# Patient Record
Sex: Female | Born: 1950 | Race: White | Hispanic: No | Smoking: Former smoker
Health system: Southern US, Community
[De-identification: ages and names within clinical notes are randomized; demographics above are authoritative.]

## PROBLEM LIST (undated history)

## (undated) DIAGNOSIS — J449 Chronic obstructive pulmonary disease, unspecified: Secondary | ICD-10-CM

## (undated) DIAGNOSIS — Z7901 Long term (current) use of anticoagulants: Secondary | ICD-10-CM

## (undated) DIAGNOSIS — E079 Disorder of thyroid, unspecified: Secondary | ICD-10-CM

## (undated) DIAGNOSIS — F1120 Opioid dependence, uncomplicated: Secondary | ICD-10-CM

## (undated) DIAGNOSIS — E559 Vitamin D deficiency, unspecified: Secondary | ICD-10-CM

## (undated) DIAGNOSIS — G8190 Hemiplegia, unspecified affecting unspecified side: Secondary | ICD-10-CM

## (undated) DIAGNOSIS — I639 Cerebral infarction, unspecified: Secondary | ICD-10-CM

## (undated) DIAGNOSIS — G894 Chronic pain syndrome: Secondary | ICD-10-CM

## (undated) DIAGNOSIS — E785 Hyperlipidemia, unspecified: Secondary | ICD-10-CM

## (undated) DIAGNOSIS — F32A Depression, unspecified: Secondary | ICD-10-CM

## (undated) DIAGNOSIS — F329 Major depressive disorder, single episode, unspecified: Secondary | ICD-10-CM

## (undated) DIAGNOSIS — F800 Phonological disorder: Secondary | ICD-10-CM

## (undated) DIAGNOSIS — U071 COVID-19: Secondary | ICD-10-CM

## (undated) DIAGNOSIS — I2510 Atherosclerotic heart disease of native coronary artery without angina pectoris: Secondary | ICD-10-CM

## (undated) DIAGNOSIS — E039 Hypothyroidism, unspecified: Secondary | ICD-10-CM

## (undated) DIAGNOSIS — K219 Gastro-esophageal reflux disease without esophagitis: Secondary | ICD-10-CM

## (undated) DIAGNOSIS — D509 Iron deficiency anemia, unspecified: Secondary | ICD-10-CM

## (undated) DIAGNOSIS — F319 Bipolar disorder, unspecified: Secondary | ICD-10-CM

## (undated) DIAGNOSIS — Z951 Presence of aortocoronary bypass graft: Secondary | ICD-10-CM

## (undated) DIAGNOSIS — I509 Heart failure, unspecified: Secondary | ICD-10-CM

## (undated) DIAGNOSIS — F419 Anxiety disorder, unspecified: Secondary | ICD-10-CM

## (undated) DIAGNOSIS — G8220 Paraplegia, unspecified: Secondary | ICD-10-CM

## (undated) DIAGNOSIS — B009 Herpesviral infection, unspecified: Secondary | ICD-10-CM

## (undated) DIAGNOSIS — Z8679 Personal history of other diseases of the circulatory system: Secondary | ICD-10-CM

## (undated) DIAGNOSIS — E8809 Other disorders of plasma-protein metabolism, not elsewhere classified: Secondary | ICD-10-CM

## (undated) DIAGNOSIS — R569 Unspecified convulsions: Secondary | ICD-10-CM

## (undated) DIAGNOSIS — I10 Essential (primary) hypertension: Secondary | ICD-10-CM

## (undated) HISTORY — PX: THYROID SURGERY: SHX805

## (undated) HISTORY — PX: CARDIAC SURGERY: SHX584

---

## 2001-04-11 ENCOUNTER — Encounter: Payer: Self-pay | Admitting: Internal Medicine

## 2001-04-11 ENCOUNTER — Emergency Department (HOSPITAL_COMMUNITY): Admission: EM | Admit: 2001-04-11 | Discharge: 2001-04-11 | Payer: Self-pay | Admitting: Internal Medicine

## 2001-04-22 ENCOUNTER — Ambulatory Visit (HOSPITAL_COMMUNITY): Admission: RE | Admit: 2001-04-22 | Discharge: 2001-04-22 | Payer: Self-pay | Admitting: Internal Medicine

## 2001-04-22 ENCOUNTER — Encounter: Payer: Self-pay | Admitting: Internal Medicine

## 2001-08-25 ENCOUNTER — Encounter: Payer: Self-pay | Admitting: Internal Medicine

## 2001-08-25 ENCOUNTER — Ambulatory Visit (HOSPITAL_COMMUNITY): Admission: RE | Admit: 2001-08-25 | Discharge: 2001-08-25 | Payer: Self-pay | Admitting: Internal Medicine

## 2002-04-20 ENCOUNTER — Encounter: Payer: Self-pay | Admitting: *Deleted

## 2002-04-20 ENCOUNTER — Emergency Department (HOSPITAL_COMMUNITY): Admission: EM | Admit: 2002-04-20 | Discharge: 2002-04-20 | Payer: Self-pay | Admitting: *Deleted

## 2002-05-13 ENCOUNTER — Encounter: Payer: Self-pay | Admitting: Internal Medicine

## 2002-05-13 ENCOUNTER — Ambulatory Visit (HOSPITAL_COMMUNITY): Admission: RE | Admit: 2002-05-13 | Discharge: 2002-05-13 | Payer: Self-pay | Admitting: Internal Medicine

## 2003-02-26 ENCOUNTER — Encounter: Payer: Self-pay | Admitting: Emergency Medicine

## 2003-02-26 ENCOUNTER — Inpatient Hospital Stay (HOSPITAL_COMMUNITY): Admission: AD | Admit: 2003-02-26 | Discharge: 2003-03-22 | Payer: Self-pay | Admitting: Critical Care Medicine

## 2003-02-26 ENCOUNTER — Encounter: Payer: Self-pay | Admitting: Vascular Surgery

## 2003-02-27 ENCOUNTER — Encounter: Payer: Self-pay | Admitting: Neurology

## 2003-02-27 ENCOUNTER — Encounter: Payer: Self-pay | Admitting: Critical Care Medicine

## 2003-02-28 ENCOUNTER — Encounter: Payer: Self-pay | Admitting: Critical Care Medicine

## 2003-02-28 ENCOUNTER — Encounter: Payer: Self-pay | Admitting: Neurology

## 2003-03-01 ENCOUNTER — Encounter: Payer: Self-pay | Admitting: Cardiology

## 2003-03-03 ENCOUNTER — Encounter: Payer: Self-pay | Admitting: Neurology

## 2003-03-03 ENCOUNTER — Encounter: Payer: Self-pay | Admitting: Cardiology

## 2003-03-05 ENCOUNTER — Encounter: Payer: Self-pay | Admitting: Critical Care Medicine

## 2003-03-06 ENCOUNTER — Encounter: Payer: Self-pay | Admitting: Cardiology

## 2003-03-16 ENCOUNTER — Encounter: Payer: Self-pay | Admitting: Cardiology

## 2003-03-21 ENCOUNTER — Encounter: Payer: Self-pay | Admitting: Cardiology

## 2003-03-22 ENCOUNTER — Inpatient Hospital Stay (HOSPITAL_COMMUNITY)
Admission: RE | Admit: 2003-03-22 | Discharge: 2003-04-12 | Payer: Self-pay | Admitting: Physical Medicine & Rehabilitation

## 2003-04-01 ENCOUNTER — Encounter: Payer: Self-pay | Admitting: Physical Medicine & Rehabilitation

## 2003-04-11 ENCOUNTER — Encounter: Payer: Self-pay | Admitting: Physical Medicine & Rehabilitation

## 2003-04-12 ENCOUNTER — Encounter: Payer: Self-pay | Admitting: Thoracic Surgery (Cardiothoracic Vascular Surgery)

## 2003-04-12 ENCOUNTER — Inpatient Hospital Stay (HOSPITAL_COMMUNITY)
Admission: AD | Admit: 2003-04-12 | Discharge: 2003-04-20 | Payer: Self-pay | Admitting: Thoracic Surgery (Cardiothoracic Vascular Surgery)

## 2003-04-13 ENCOUNTER — Encounter: Payer: Self-pay | Admitting: Thoracic Surgery (Cardiothoracic Vascular Surgery)

## 2003-04-14 ENCOUNTER — Encounter: Payer: Self-pay | Admitting: Thoracic Surgery (Cardiothoracic Vascular Surgery)

## 2003-04-15 ENCOUNTER — Encounter: Payer: Self-pay | Admitting: Thoracic Surgery (Cardiothoracic Vascular Surgery)

## 2003-04-18 ENCOUNTER — Encounter: Payer: Self-pay | Admitting: Cardiothoracic Surgery

## 2003-04-19 ENCOUNTER — Encounter: Payer: Self-pay | Admitting: Pulmonary Disease

## 2003-04-20 ENCOUNTER — Inpatient Hospital Stay (HOSPITAL_COMMUNITY)
Admission: RE | Admit: 2003-04-20 | Discharge: 2003-06-07 | Payer: Self-pay | Admitting: Physical Medicine & Rehabilitation

## 2003-06-06 ENCOUNTER — Encounter: Payer: Self-pay | Admitting: Physical Medicine & Rehabilitation

## 2003-07-27 ENCOUNTER — Emergency Department (HOSPITAL_COMMUNITY): Admission: EM | Admit: 2003-07-27 | Discharge: 2003-07-27 | Payer: Self-pay | Admitting: Emergency Medicine

## 2003-07-27 ENCOUNTER — Encounter: Payer: Self-pay | Admitting: Emergency Medicine

## 2003-08-25 ENCOUNTER — Ambulatory Visit (HOSPITAL_COMMUNITY): Admission: RE | Admit: 2003-08-25 | Discharge: 2003-08-25 | Payer: Self-pay | Admitting: Internal Medicine

## 2003-09-10 ENCOUNTER — Emergency Department (HOSPITAL_COMMUNITY): Admission: EM | Admit: 2003-09-10 | Discharge: 2003-09-10 | Payer: Self-pay | Admitting: Emergency Medicine

## 2003-09-19 ENCOUNTER — Encounter: Admission: RE | Admit: 2003-09-19 | Discharge: 2003-09-19 | Payer: Self-pay | Admitting: Internal Medicine

## 2003-10-28 ENCOUNTER — Ambulatory Visit (HOSPITAL_COMMUNITY): Admission: RE | Admit: 2003-10-28 | Discharge: 2003-10-28 | Payer: Self-pay | Admitting: Internal Medicine

## 2003-11-09 ENCOUNTER — Inpatient Hospital Stay (HOSPITAL_COMMUNITY): Admission: EM | Admit: 2003-11-09 | Discharge: 2003-11-22 | Payer: Self-pay | Admitting: Emergency Medicine

## 2004-03-22 ENCOUNTER — Ambulatory Visit (HOSPITAL_COMMUNITY): Admission: RE | Admit: 2004-03-22 | Discharge: 2004-03-22 | Payer: Self-pay | Admitting: Internal Medicine

## 2004-12-09 ENCOUNTER — Inpatient Hospital Stay (HOSPITAL_COMMUNITY): Admission: EM | Admit: 2004-12-09 | Discharge: 2004-12-14 | Payer: Self-pay | Admitting: Emergency Medicine

## 2005-01-10 ENCOUNTER — Ambulatory Visit (HOSPITAL_COMMUNITY): Admission: RE | Admit: 2005-01-10 | Discharge: 2005-01-10 | Payer: Self-pay | Admitting: Internal Medicine

## 2005-02-20 ENCOUNTER — Emergency Department (HOSPITAL_COMMUNITY): Admission: EM | Admit: 2005-02-20 | Discharge: 2005-02-20 | Payer: Self-pay | Admitting: Emergency Medicine

## 2005-04-17 ENCOUNTER — Ambulatory Visit (HOSPITAL_COMMUNITY): Admission: RE | Admit: 2005-04-17 | Discharge: 2005-04-17 | Payer: Self-pay | Admitting: Internal Medicine

## 2005-09-18 ENCOUNTER — Emergency Department (HOSPITAL_COMMUNITY): Admission: EM | Admit: 2005-09-18 | Discharge: 2005-09-18 | Payer: Self-pay | Admitting: Emergency Medicine

## 2006-02-11 ENCOUNTER — Ambulatory Visit (HOSPITAL_COMMUNITY): Admission: RE | Admit: 2006-02-11 | Discharge: 2006-02-11 | Payer: Self-pay | Admitting: Internal Medicine

## 2006-03-06 ENCOUNTER — Ambulatory Visit (HOSPITAL_COMMUNITY): Admission: RE | Admit: 2006-03-06 | Discharge: 2006-03-06 | Payer: Self-pay | Admitting: Internal Medicine

## 2010-11-04 ENCOUNTER — Encounter: Payer: Self-pay | Admitting: Internal Medicine

## 2013-10-19 DIAGNOSIS — I4891 Unspecified atrial fibrillation: Secondary | ICD-10-CM | POA: Diagnosis not present

## 2013-10-19 DIAGNOSIS — Z7901 Long term (current) use of anticoagulants: Secondary | ICD-10-CM | POA: Diagnosis not present

## 2013-10-26 DIAGNOSIS — Z7901 Long term (current) use of anticoagulants: Secondary | ICD-10-CM | POA: Diagnosis not present

## 2013-11-01 DIAGNOSIS — R6889 Other general symptoms and signs: Secondary | ICD-10-CM | POA: Diagnosis not present

## 2013-11-09 DIAGNOSIS — Z8673 Personal history of transient ischemic attack (TIA), and cerebral infarction without residual deficits: Secondary | ICD-10-CM | POA: Diagnosis not present

## 2013-11-09 DIAGNOSIS — Z7901 Long term (current) use of anticoagulants: Secondary | ICD-10-CM | POA: Diagnosis not present

## 2013-11-09 DIAGNOSIS — D689 Coagulation defect, unspecified: Secondary | ICD-10-CM | POA: Diagnosis not present

## 2013-11-16 DIAGNOSIS — D689 Coagulation defect, unspecified: Secondary | ICD-10-CM | POA: Diagnosis not present

## 2013-11-16 DIAGNOSIS — I4891 Unspecified atrial fibrillation: Secondary | ICD-10-CM | POA: Diagnosis not present

## 2013-11-16 DIAGNOSIS — Z8673 Personal history of transient ischemic attack (TIA), and cerebral infarction without residual deficits: Secondary | ICD-10-CM | POA: Diagnosis not present

## 2013-11-17 DIAGNOSIS — B351 Tinea unguium: Secondary | ICD-10-CM | POA: Diagnosis not present

## 2013-11-17 DIAGNOSIS — M79609 Pain in unspecified limb: Secondary | ICD-10-CM | POA: Diagnosis not present

## 2013-11-23 DIAGNOSIS — Z8673 Personal history of transient ischemic attack (TIA), and cerebral infarction without residual deficits: Secondary | ICD-10-CM | POA: Diagnosis not present

## 2013-11-23 DIAGNOSIS — I4891 Unspecified atrial fibrillation: Secondary | ICD-10-CM | POA: Diagnosis not present

## 2013-11-23 DIAGNOSIS — D689 Coagulation defect, unspecified: Secondary | ICD-10-CM | POA: Diagnosis not present

## 2013-11-28 DIAGNOSIS — E039 Hypothyroidism, unspecified: Secondary | ICD-10-CM | POA: Diagnosis not present

## 2013-11-29 DIAGNOSIS — H2510 Age-related nuclear cataract, unspecified eye: Secondary | ICD-10-CM | POA: Diagnosis not present

## 2013-11-29 DIAGNOSIS — H04129 Dry eye syndrome of unspecified lacrimal gland: Secondary | ICD-10-CM | POA: Diagnosis not present

## 2013-11-29 DIAGNOSIS — IMO0002 Reserved for concepts with insufficient information to code with codable children: Secondary | ICD-10-CM | POA: Diagnosis not present

## 2013-11-29 DIAGNOSIS — H40019 Open angle with borderline findings, low risk, unspecified eye: Secondary | ICD-10-CM | POA: Diagnosis not present

## 2013-11-30 DIAGNOSIS — I4891 Unspecified atrial fibrillation: Secondary | ICD-10-CM | POA: Diagnosis not present

## 2013-12-01 DIAGNOSIS — F411 Generalized anxiety disorder: Secondary | ICD-10-CM | POA: Diagnosis not present

## 2013-12-01 DIAGNOSIS — I10 Essential (primary) hypertension: Secondary | ICD-10-CM | POA: Diagnosis not present

## 2013-12-03 DIAGNOSIS — D689 Coagulation defect, unspecified: Secondary | ICD-10-CM | POA: Diagnosis not present

## 2013-12-03 DIAGNOSIS — Z7901 Long term (current) use of anticoagulants: Secondary | ICD-10-CM | POA: Diagnosis not present

## 2013-12-03 DIAGNOSIS — Z8673 Personal history of transient ischemic attack (TIA), and cerebral infarction without residual deficits: Secondary | ICD-10-CM | POA: Diagnosis not present

## 2013-12-07 DIAGNOSIS — I4891 Unspecified atrial fibrillation: Secondary | ICD-10-CM | POA: Diagnosis not present

## 2013-12-10 DIAGNOSIS — R791 Abnormal coagulation profile: Secondary | ICD-10-CM | POA: Diagnosis not present

## 2013-12-10 DIAGNOSIS — I4891 Unspecified atrial fibrillation: Secondary | ICD-10-CM | POA: Diagnosis not present

## 2013-12-10 DIAGNOSIS — D689 Coagulation defect, unspecified: Secondary | ICD-10-CM | POA: Diagnosis not present

## 2013-12-10 DIAGNOSIS — Z7901 Long term (current) use of anticoagulants: Secondary | ICD-10-CM | POA: Diagnosis not present

## 2013-12-17 DIAGNOSIS — F411 Generalized anxiety disorder: Secondary | ICD-10-CM | POA: Diagnosis not present

## 2013-12-17 DIAGNOSIS — I4891 Unspecified atrial fibrillation: Secondary | ICD-10-CM | POA: Diagnosis not present

## 2013-12-17 DIAGNOSIS — M6281 Muscle weakness (generalized): Secondary | ICD-10-CM | POA: Diagnosis not present

## 2013-12-17 DIAGNOSIS — D689 Coagulation defect, unspecified: Secondary | ICD-10-CM | POA: Diagnosis not present

## 2013-12-21 DIAGNOSIS — D689 Coagulation defect, unspecified: Secondary | ICD-10-CM | POA: Diagnosis not present

## 2013-12-21 DIAGNOSIS — I4891 Unspecified atrial fibrillation: Secondary | ICD-10-CM | POA: Diagnosis not present

## 2013-12-28 DIAGNOSIS — D689 Coagulation defect, unspecified: Secondary | ICD-10-CM | POA: Diagnosis not present

## 2013-12-28 DIAGNOSIS — I4891 Unspecified atrial fibrillation: Secondary | ICD-10-CM | POA: Diagnosis not present

## 2014-01-11 DIAGNOSIS — I4891 Unspecified atrial fibrillation: Secondary | ICD-10-CM | POA: Diagnosis not present

## 2014-01-11 DIAGNOSIS — D689 Coagulation defect, unspecified: Secondary | ICD-10-CM | POA: Diagnosis not present

## 2014-01-18 DIAGNOSIS — D689 Coagulation defect, unspecified: Secondary | ICD-10-CM | POA: Diagnosis not present

## 2014-01-18 DIAGNOSIS — I4891 Unspecified atrial fibrillation: Secondary | ICD-10-CM | POA: Diagnosis not present

## 2014-01-21 DIAGNOSIS — D689 Coagulation defect, unspecified: Secondary | ICD-10-CM | POA: Diagnosis not present

## 2014-01-21 DIAGNOSIS — K5900 Constipation, unspecified: Secondary | ICD-10-CM | POA: Diagnosis not present

## 2014-01-21 DIAGNOSIS — K120 Recurrent oral aphthae: Secondary | ICD-10-CM | POA: Diagnosis not present

## 2014-01-21 DIAGNOSIS — R300 Dysuria: Secondary | ICD-10-CM | POA: Diagnosis not present

## 2014-01-21 DIAGNOSIS — I4891 Unspecified atrial fibrillation: Secondary | ICD-10-CM | POA: Diagnosis not present

## 2014-01-22 DIAGNOSIS — R300 Dysuria: Secondary | ICD-10-CM | POA: Diagnosis not present

## 2014-01-24 DIAGNOSIS — N390 Urinary tract infection, site not specified: Secondary | ICD-10-CM | POA: Diagnosis not present

## 2014-01-24 DIAGNOSIS — D689 Coagulation defect, unspecified: Secondary | ICD-10-CM | POA: Diagnosis not present

## 2014-01-25 DIAGNOSIS — I509 Heart failure, unspecified: Secondary | ICD-10-CM | POA: Diagnosis not present

## 2014-01-25 DIAGNOSIS — N390 Urinary tract infection, site not specified: Secondary | ICD-10-CM | POA: Diagnosis not present

## 2014-01-25 DIAGNOSIS — D689 Coagulation defect, unspecified: Secondary | ICD-10-CM | POA: Diagnosis not present

## 2014-01-25 DIAGNOSIS — Z79899 Other long term (current) drug therapy: Secondary | ICD-10-CM | POA: Diagnosis not present

## 2014-01-25 DIAGNOSIS — I10 Essential (primary) hypertension: Secondary | ICD-10-CM | POA: Diagnosis not present

## 2014-01-25 DIAGNOSIS — I4891 Unspecified atrial fibrillation: Secondary | ICD-10-CM | POA: Diagnosis not present

## 2014-01-25 DIAGNOSIS — E46 Unspecified protein-calorie malnutrition: Secondary | ICD-10-CM | POA: Diagnosis not present

## 2014-01-26 DIAGNOSIS — K120 Recurrent oral aphthae: Secondary | ICD-10-CM | POA: Diagnosis not present

## 2014-01-26 DIAGNOSIS — D689 Coagulation defect, unspecified: Secondary | ICD-10-CM | POA: Diagnosis not present

## 2014-01-26 DIAGNOSIS — N390 Urinary tract infection, site not specified: Secondary | ICD-10-CM | POA: Diagnosis not present

## 2014-01-28 DIAGNOSIS — D689 Coagulation defect, unspecified: Secondary | ICD-10-CM | POA: Diagnosis not present

## 2014-01-28 DIAGNOSIS — K120 Recurrent oral aphthae: Secondary | ICD-10-CM | POA: Diagnosis not present

## 2014-01-28 DIAGNOSIS — Z79899 Other long term (current) drug therapy: Secondary | ICD-10-CM | POA: Diagnosis not present

## 2014-01-28 DIAGNOSIS — N390 Urinary tract infection, site not specified: Secondary | ICD-10-CM | POA: Diagnosis not present

## 2014-02-01 DIAGNOSIS — D689 Coagulation defect, unspecified: Secondary | ICD-10-CM | POA: Diagnosis not present

## 2014-02-01 DIAGNOSIS — Z79899 Other long term (current) drug therapy: Secondary | ICD-10-CM | POA: Diagnosis not present

## 2014-02-01 DIAGNOSIS — N390 Urinary tract infection, site not specified: Secondary | ICD-10-CM | POA: Diagnosis not present

## 2014-02-01 DIAGNOSIS — Z7901 Long term (current) use of anticoagulants: Secondary | ICD-10-CM | POA: Diagnosis not present

## 2014-02-02 DIAGNOSIS — B351 Tinea unguium: Secondary | ICD-10-CM | POA: Diagnosis not present

## 2014-02-02 DIAGNOSIS — M79609 Pain in unspecified limb: Secondary | ICD-10-CM | POA: Diagnosis not present

## 2014-02-08 DIAGNOSIS — Z7901 Long term (current) use of anticoagulants: Secondary | ICD-10-CM | POA: Diagnosis not present

## 2014-02-08 DIAGNOSIS — D689 Coagulation defect, unspecified: Secondary | ICD-10-CM | POA: Diagnosis not present

## 2014-02-08 DIAGNOSIS — N390 Urinary tract infection, site not specified: Secondary | ICD-10-CM | POA: Diagnosis not present

## 2014-02-08 DIAGNOSIS — Z79899 Other long term (current) drug therapy: Secondary | ICD-10-CM | POA: Diagnosis not present

## 2014-02-15 DIAGNOSIS — I4891 Unspecified atrial fibrillation: Secondary | ICD-10-CM | POA: Diagnosis not present

## 2014-02-15 DIAGNOSIS — D689 Coagulation defect, unspecified: Secondary | ICD-10-CM | POA: Diagnosis not present

## 2014-02-15 DIAGNOSIS — Z79899 Other long term (current) drug therapy: Secondary | ICD-10-CM | POA: Diagnosis not present

## 2014-02-22 DIAGNOSIS — Z79899 Other long term (current) drug therapy: Secondary | ICD-10-CM | POA: Diagnosis not present

## 2014-02-23 DIAGNOSIS — D689 Coagulation defect, unspecified: Secondary | ICD-10-CM | POA: Diagnosis not present

## 2014-03-01 DIAGNOSIS — I4891 Unspecified atrial fibrillation: Secondary | ICD-10-CM | POA: Diagnosis not present

## 2014-03-01 DIAGNOSIS — D689 Coagulation defect, unspecified: Secondary | ICD-10-CM | POA: Diagnosis not present

## 2014-03-01 DIAGNOSIS — G609 Hereditary and idiopathic neuropathy, unspecified: Secondary | ICD-10-CM | POA: Diagnosis not present

## 2014-03-08 DIAGNOSIS — D689 Coagulation defect, unspecified: Secondary | ICD-10-CM | POA: Diagnosis not present

## 2014-03-08 DIAGNOSIS — I4891 Unspecified atrial fibrillation: Secondary | ICD-10-CM | POA: Diagnosis not present

## 2014-03-11 DIAGNOSIS — D689 Coagulation defect, unspecified: Secondary | ICD-10-CM | POA: Diagnosis not present

## 2014-03-11 DIAGNOSIS — I4891 Unspecified atrial fibrillation: Secondary | ICD-10-CM | POA: Diagnosis not present

## 2014-03-15 DIAGNOSIS — I4891 Unspecified atrial fibrillation: Secondary | ICD-10-CM | POA: Diagnosis not present

## 2014-03-21 DIAGNOSIS — R10819 Abdominal tenderness, unspecified site: Secondary | ICD-10-CM | POA: Diagnosis not present

## 2014-03-21 DIAGNOSIS — D689 Coagulation defect, unspecified: Secondary | ICD-10-CM | POA: Diagnosis not present

## 2014-03-21 DIAGNOSIS — R300 Dysuria: Secondary | ICD-10-CM | POA: Diagnosis not present

## 2014-03-22 DIAGNOSIS — R300 Dysuria: Secondary | ICD-10-CM | POA: Diagnosis not present

## 2014-03-22 DIAGNOSIS — R10819 Abdominal tenderness, unspecified site: Secondary | ICD-10-CM | POA: Diagnosis not present

## 2014-03-22 DIAGNOSIS — I4891 Unspecified atrial fibrillation: Secondary | ICD-10-CM | POA: Diagnosis not present

## 2014-03-22 DIAGNOSIS — D689 Coagulation defect, unspecified: Secondary | ICD-10-CM | POA: Diagnosis not present

## 2014-03-23 DIAGNOSIS — R300 Dysuria: Secondary | ICD-10-CM | POA: Diagnosis not present

## 2014-03-24 DIAGNOSIS — I4891 Unspecified atrial fibrillation: Secondary | ICD-10-CM | POA: Diagnosis not present

## 2014-03-25 DIAGNOSIS — R10819 Abdominal tenderness, unspecified site: Secondary | ICD-10-CM | POA: Diagnosis not present

## 2014-03-25 DIAGNOSIS — D689 Coagulation defect, unspecified: Secondary | ICD-10-CM | POA: Diagnosis not present

## 2014-03-31 DIAGNOSIS — I4891 Unspecified atrial fibrillation: Secondary | ICD-10-CM | POA: Diagnosis not present

## 2014-03-31 DIAGNOSIS — D689 Coagulation defect, unspecified: Secondary | ICD-10-CM | POA: Diagnosis not present

## 2014-04-01 DIAGNOSIS — I517 Cardiomegaly: Secondary | ICD-10-CM | POA: Diagnosis not present

## 2014-04-07 DIAGNOSIS — I4891 Unspecified atrial fibrillation: Secondary | ICD-10-CM | POA: Diagnosis not present

## 2014-04-09 DIAGNOSIS — R609 Edema, unspecified: Secondary | ICD-10-CM | POA: Diagnosis not present

## 2014-04-12 DIAGNOSIS — I509 Heart failure, unspecified: Secondary | ICD-10-CM | POA: Diagnosis not present

## 2014-04-12 DIAGNOSIS — M6281 Muscle weakness (generalized): Secondary | ICD-10-CM | POA: Diagnosis not present

## 2014-04-13 DIAGNOSIS — M6281 Muscle weakness (generalized): Secondary | ICD-10-CM | POA: Diagnosis not present

## 2014-04-13 DIAGNOSIS — F329 Major depressive disorder, single episode, unspecified: Secondary | ICD-10-CM | POA: Diagnosis not present

## 2014-04-13 DIAGNOSIS — I509 Heart failure, unspecified: Secondary | ICD-10-CM | POA: Diagnosis not present

## 2014-04-13 DIAGNOSIS — F3289 Other specified depressive episodes: Secondary | ICD-10-CM | POA: Diagnosis not present

## 2014-04-14 DIAGNOSIS — Z8673 Personal history of transient ischemic attack (TIA), and cerebral infarction without residual deficits: Secondary | ICD-10-CM | POA: Diagnosis not present

## 2014-04-14 DIAGNOSIS — R52 Pain, unspecified: Secondary | ICD-10-CM | POA: Diagnosis not present

## 2014-04-14 DIAGNOSIS — D689 Coagulation defect, unspecified: Secondary | ICD-10-CM | POA: Diagnosis not present

## 2014-04-14 DIAGNOSIS — Z7901 Long term (current) use of anticoagulants: Secondary | ICD-10-CM | POA: Diagnosis not present

## 2014-04-14 DIAGNOSIS — R10819 Abdominal tenderness, unspecified site: Secondary | ICD-10-CM | POA: Diagnosis not present

## 2014-04-20 DIAGNOSIS — M79609 Pain in unspecified limb: Secondary | ICD-10-CM | POA: Diagnosis not present

## 2014-04-20 DIAGNOSIS — B351 Tinea unguium: Secondary | ICD-10-CM | POA: Diagnosis not present

## 2014-04-21 DIAGNOSIS — Z7901 Long term (current) use of anticoagulants: Secondary | ICD-10-CM | POA: Diagnosis not present

## 2014-04-21 DIAGNOSIS — D689 Coagulation defect, unspecified: Secondary | ICD-10-CM | POA: Diagnosis not present

## 2014-04-21 DIAGNOSIS — Z8673 Personal history of transient ischemic attack (TIA), and cerebral infarction without residual deficits: Secondary | ICD-10-CM | POA: Diagnosis not present

## 2014-04-28 DIAGNOSIS — Z7901 Long term (current) use of anticoagulants: Secondary | ICD-10-CM | POA: Diagnosis not present

## 2014-04-28 DIAGNOSIS — R109 Unspecified abdominal pain: Secondary | ICD-10-CM | POA: Diagnosis not present

## 2014-05-10 DIAGNOSIS — Z8673 Personal history of transient ischemic attack (TIA), and cerebral infarction without residual deficits: Secondary | ICD-10-CM | POA: Diagnosis not present

## 2014-05-10 DIAGNOSIS — B369 Superficial mycosis, unspecified: Secondary | ICD-10-CM | POA: Diagnosis not present

## 2014-05-10 DIAGNOSIS — D689 Coagulation defect, unspecified: Secondary | ICD-10-CM | POA: Diagnosis not present

## 2014-05-12 DIAGNOSIS — Z8673 Personal history of transient ischemic attack (TIA), and cerebral infarction without residual deficits: Secondary | ICD-10-CM | POA: Diagnosis not present

## 2014-05-12 DIAGNOSIS — B369 Superficial mycosis, unspecified: Secondary | ICD-10-CM | POA: Diagnosis not present

## 2014-05-12 DIAGNOSIS — I4891 Unspecified atrial fibrillation: Secondary | ICD-10-CM | POA: Diagnosis not present

## 2014-05-12 DIAGNOSIS — D689 Coagulation defect, unspecified: Secondary | ICD-10-CM | POA: Diagnosis not present

## 2014-05-16 DIAGNOSIS — I509 Heart failure, unspecified: Secondary | ICD-10-CM | POA: Diagnosis not present

## 2014-05-16 DIAGNOSIS — F329 Major depressive disorder, single episode, unspecified: Secondary | ICD-10-CM | POA: Diagnosis not present

## 2014-05-16 DIAGNOSIS — F3289 Other specified depressive episodes: Secondary | ICD-10-CM | POA: Diagnosis not present

## 2014-05-16 DIAGNOSIS — M6281 Muscle weakness (generalized): Secondary | ICD-10-CM | POA: Diagnosis not present

## 2014-05-17 DIAGNOSIS — F3289 Other specified depressive episodes: Secondary | ICD-10-CM | POA: Diagnosis not present

## 2014-05-17 DIAGNOSIS — I509 Heart failure, unspecified: Secondary | ICD-10-CM | POA: Diagnosis not present

## 2014-05-17 DIAGNOSIS — M6281 Muscle weakness (generalized): Secondary | ICD-10-CM | POA: Diagnosis not present

## 2014-05-17 DIAGNOSIS — F329 Major depressive disorder, single episode, unspecified: Secondary | ICD-10-CM | POA: Diagnosis not present

## 2014-05-18 DIAGNOSIS — M6281 Muscle weakness (generalized): Secondary | ICD-10-CM | POA: Diagnosis not present

## 2014-05-18 DIAGNOSIS — F329 Major depressive disorder, single episode, unspecified: Secondary | ICD-10-CM | POA: Diagnosis not present

## 2014-05-18 DIAGNOSIS — I509 Heart failure, unspecified: Secondary | ICD-10-CM | POA: Diagnosis not present

## 2014-05-18 DIAGNOSIS — F3289 Other specified depressive episodes: Secondary | ICD-10-CM | POA: Diagnosis not present

## 2014-05-19 DIAGNOSIS — F3289 Other specified depressive episodes: Secondary | ICD-10-CM | POA: Diagnosis not present

## 2014-05-19 DIAGNOSIS — Z7901 Long term (current) use of anticoagulants: Secondary | ICD-10-CM | POA: Diagnosis not present

## 2014-05-19 DIAGNOSIS — M6281 Muscle weakness (generalized): Secondary | ICD-10-CM | POA: Diagnosis not present

## 2014-05-19 DIAGNOSIS — F329 Major depressive disorder, single episode, unspecified: Secondary | ICD-10-CM | POA: Diagnosis not present

## 2014-05-19 DIAGNOSIS — I509 Heart failure, unspecified: Secondary | ICD-10-CM | POA: Diagnosis not present

## 2014-05-21 DIAGNOSIS — F3289 Other specified depressive episodes: Secondary | ICD-10-CM | POA: Diagnosis not present

## 2014-05-21 DIAGNOSIS — M6281 Muscle weakness (generalized): Secondary | ICD-10-CM | POA: Diagnosis not present

## 2014-05-21 DIAGNOSIS — I509 Heart failure, unspecified: Secondary | ICD-10-CM | POA: Diagnosis not present

## 2014-05-21 DIAGNOSIS — F329 Major depressive disorder, single episode, unspecified: Secondary | ICD-10-CM | POA: Diagnosis not present

## 2014-05-23 DIAGNOSIS — F329 Major depressive disorder, single episode, unspecified: Secondary | ICD-10-CM | POA: Diagnosis not present

## 2014-05-23 DIAGNOSIS — F3289 Other specified depressive episodes: Secondary | ICD-10-CM | POA: Diagnosis not present

## 2014-05-23 DIAGNOSIS — I509 Heart failure, unspecified: Secondary | ICD-10-CM | POA: Diagnosis not present

## 2014-05-23 DIAGNOSIS — M6281 Muscle weakness (generalized): Secondary | ICD-10-CM | POA: Diagnosis not present

## 2014-05-24 DIAGNOSIS — I509 Heart failure, unspecified: Secondary | ICD-10-CM | POA: Diagnosis not present

## 2014-05-24 DIAGNOSIS — F329 Major depressive disorder, single episode, unspecified: Secondary | ICD-10-CM | POA: Diagnosis not present

## 2014-05-24 DIAGNOSIS — M6281 Muscle weakness (generalized): Secondary | ICD-10-CM | POA: Diagnosis not present

## 2014-05-24 DIAGNOSIS — F3289 Other specified depressive episodes: Secondary | ICD-10-CM | POA: Diagnosis not present

## 2014-05-25 DIAGNOSIS — I509 Heart failure, unspecified: Secondary | ICD-10-CM | POA: Diagnosis not present

## 2014-05-25 DIAGNOSIS — F3289 Other specified depressive episodes: Secondary | ICD-10-CM | POA: Diagnosis not present

## 2014-05-25 DIAGNOSIS — F329 Major depressive disorder, single episode, unspecified: Secondary | ICD-10-CM | POA: Diagnosis not present

## 2014-05-25 DIAGNOSIS — M6281 Muscle weakness (generalized): Secondary | ICD-10-CM | POA: Diagnosis not present

## 2014-05-26 DIAGNOSIS — I509 Heart failure, unspecified: Secondary | ICD-10-CM | POA: Diagnosis not present

## 2014-05-26 DIAGNOSIS — F329 Major depressive disorder, single episode, unspecified: Secondary | ICD-10-CM | POA: Diagnosis not present

## 2014-05-26 DIAGNOSIS — M6281 Muscle weakness (generalized): Secondary | ICD-10-CM | POA: Diagnosis not present

## 2014-05-26 DIAGNOSIS — F3289 Other specified depressive episodes: Secondary | ICD-10-CM | POA: Diagnosis not present

## 2014-05-27 DIAGNOSIS — F329 Major depressive disorder, single episode, unspecified: Secondary | ICD-10-CM | POA: Diagnosis not present

## 2014-05-27 DIAGNOSIS — I509 Heart failure, unspecified: Secondary | ICD-10-CM | POA: Diagnosis not present

## 2014-05-27 DIAGNOSIS — F3289 Other specified depressive episodes: Secondary | ICD-10-CM | POA: Diagnosis not present

## 2014-05-27 DIAGNOSIS — M6281 Muscle weakness (generalized): Secondary | ICD-10-CM | POA: Diagnosis not present

## 2014-05-30 DIAGNOSIS — M6281 Muscle weakness (generalized): Secondary | ICD-10-CM | POA: Diagnosis not present

## 2014-05-30 DIAGNOSIS — I509 Heart failure, unspecified: Secondary | ICD-10-CM | POA: Diagnosis not present

## 2014-05-30 DIAGNOSIS — F3289 Other specified depressive episodes: Secondary | ICD-10-CM | POA: Diagnosis not present

## 2014-05-30 DIAGNOSIS — F329 Major depressive disorder, single episode, unspecified: Secondary | ICD-10-CM | POA: Diagnosis not present

## 2014-05-31 DIAGNOSIS — F329 Major depressive disorder, single episode, unspecified: Secondary | ICD-10-CM | POA: Diagnosis not present

## 2014-05-31 DIAGNOSIS — M6281 Muscle weakness (generalized): Secondary | ICD-10-CM | POA: Diagnosis not present

## 2014-05-31 DIAGNOSIS — F3289 Other specified depressive episodes: Secondary | ICD-10-CM | POA: Diagnosis not present

## 2014-05-31 DIAGNOSIS — I509 Heart failure, unspecified: Secondary | ICD-10-CM | POA: Diagnosis not present

## 2014-06-01 DIAGNOSIS — F329 Major depressive disorder, single episode, unspecified: Secondary | ICD-10-CM | POA: Diagnosis not present

## 2014-06-01 DIAGNOSIS — F3289 Other specified depressive episodes: Secondary | ICD-10-CM | POA: Diagnosis not present

## 2014-06-01 DIAGNOSIS — I509 Heart failure, unspecified: Secondary | ICD-10-CM | POA: Diagnosis not present

## 2014-06-01 DIAGNOSIS — M6281 Muscle weakness (generalized): Secondary | ICD-10-CM | POA: Diagnosis not present

## 2014-06-02 DIAGNOSIS — I509 Heart failure, unspecified: Secondary | ICD-10-CM | POA: Diagnosis not present

## 2014-06-02 DIAGNOSIS — F329 Major depressive disorder, single episode, unspecified: Secondary | ICD-10-CM | POA: Diagnosis not present

## 2014-06-02 DIAGNOSIS — M6281 Muscle weakness (generalized): Secondary | ICD-10-CM | POA: Diagnosis not present

## 2014-06-02 DIAGNOSIS — F3289 Other specified depressive episodes: Secondary | ICD-10-CM | POA: Diagnosis not present

## 2014-06-03 DIAGNOSIS — Z8673 Personal history of transient ischemic attack (TIA), and cerebral infarction without residual deficits: Secondary | ICD-10-CM | POA: Diagnosis not present

## 2014-06-03 DIAGNOSIS — D689 Coagulation defect, unspecified: Secondary | ICD-10-CM | POA: Diagnosis not present

## 2014-06-03 DIAGNOSIS — Z7901 Long term (current) use of anticoagulants: Secondary | ICD-10-CM | POA: Diagnosis not present

## 2014-06-03 DIAGNOSIS — F3289 Other specified depressive episodes: Secondary | ICD-10-CM | POA: Diagnosis not present

## 2014-06-03 DIAGNOSIS — F329 Major depressive disorder, single episode, unspecified: Secondary | ICD-10-CM | POA: Diagnosis not present

## 2014-06-03 DIAGNOSIS — M6281 Muscle weakness (generalized): Secondary | ICD-10-CM | POA: Diagnosis not present

## 2014-06-03 DIAGNOSIS — I509 Heart failure, unspecified: Secondary | ICD-10-CM | POA: Diagnosis not present

## 2014-06-06 DIAGNOSIS — I509 Heart failure, unspecified: Secondary | ICD-10-CM | POA: Diagnosis not present

## 2014-06-06 DIAGNOSIS — F3289 Other specified depressive episodes: Secondary | ICD-10-CM | POA: Diagnosis not present

## 2014-06-06 DIAGNOSIS — M6281 Muscle weakness (generalized): Secondary | ICD-10-CM | POA: Diagnosis not present

## 2014-06-06 DIAGNOSIS — F329 Major depressive disorder, single episode, unspecified: Secondary | ICD-10-CM | POA: Diagnosis not present

## 2014-06-07 DIAGNOSIS — F3289 Other specified depressive episodes: Secondary | ICD-10-CM | POA: Diagnosis not present

## 2014-06-07 DIAGNOSIS — I509 Heart failure, unspecified: Secondary | ICD-10-CM | POA: Diagnosis not present

## 2014-06-07 DIAGNOSIS — M6281 Muscle weakness (generalized): Secondary | ICD-10-CM | POA: Diagnosis not present

## 2014-06-07 DIAGNOSIS — F329 Major depressive disorder, single episode, unspecified: Secondary | ICD-10-CM | POA: Diagnosis not present

## 2014-06-08 DIAGNOSIS — M6281 Muscle weakness (generalized): Secondary | ICD-10-CM | POA: Diagnosis not present

## 2014-06-08 DIAGNOSIS — F3289 Other specified depressive episodes: Secondary | ICD-10-CM | POA: Diagnosis not present

## 2014-06-08 DIAGNOSIS — F329 Major depressive disorder, single episode, unspecified: Secondary | ICD-10-CM | POA: Diagnosis not present

## 2014-06-08 DIAGNOSIS — I509 Heart failure, unspecified: Secondary | ICD-10-CM | POA: Diagnosis not present

## 2014-06-09 DIAGNOSIS — I509 Heart failure, unspecified: Secondary | ICD-10-CM | POA: Diagnosis not present

## 2014-06-09 DIAGNOSIS — F329 Major depressive disorder, single episode, unspecified: Secondary | ICD-10-CM | POA: Diagnosis not present

## 2014-06-09 DIAGNOSIS — F3289 Other specified depressive episodes: Secondary | ICD-10-CM | POA: Diagnosis not present

## 2014-06-09 DIAGNOSIS — M6281 Muscle weakness (generalized): Secondary | ICD-10-CM | POA: Diagnosis not present

## 2014-06-10 DIAGNOSIS — I509 Heart failure, unspecified: Secondary | ICD-10-CM | POA: Diagnosis not present

## 2014-06-10 DIAGNOSIS — F329 Major depressive disorder, single episode, unspecified: Secondary | ICD-10-CM | POA: Diagnosis not present

## 2014-06-10 DIAGNOSIS — F3289 Other specified depressive episodes: Secondary | ICD-10-CM | POA: Diagnosis not present

## 2014-06-10 DIAGNOSIS — M6281 Muscle weakness (generalized): Secondary | ICD-10-CM | POA: Diagnosis not present

## 2014-06-13 DIAGNOSIS — F329 Major depressive disorder, single episode, unspecified: Secondary | ICD-10-CM | POA: Diagnosis not present

## 2014-06-13 DIAGNOSIS — I509 Heart failure, unspecified: Secondary | ICD-10-CM | POA: Diagnosis not present

## 2014-06-13 DIAGNOSIS — M6281 Muscle weakness (generalized): Secondary | ICD-10-CM | POA: Diagnosis not present

## 2014-06-13 DIAGNOSIS — F3289 Other specified depressive episodes: Secondary | ICD-10-CM | POA: Diagnosis not present

## 2014-06-14 DIAGNOSIS — F329 Major depressive disorder, single episode, unspecified: Secondary | ICD-10-CM | POA: Diagnosis not present

## 2014-06-14 DIAGNOSIS — M6281 Muscle weakness (generalized): Secondary | ICD-10-CM | POA: Diagnosis not present

## 2014-06-14 DIAGNOSIS — F3289 Other specified depressive episodes: Secondary | ICD-10-CM | POA: Diagnosis not present

## 2014-06-14 DIAGNOSIS — I509 Heart failure, unspecified: Secondary | ICD-10-CM | POA: Diagnosis not present

## 2014-06-15 DIAGNOSIS — Z8673 Personal history of transient ischemic attack (TIA), and cerebral infarction without residual deficits: Secondary | ICD-10-CM | POA: Diagnosis not present

## 2014-06-15 DIAGNOSIS — R1032 Left lower quadrant pain: Secondary | ICD-10-CM | POA: Diagnosis not present

## 2014-06-15 DIAGNOSIS — I509 Heart failure, unspecified: Secondary | ICD-10-CM | POA: Diagnosis not present

## 2014-06-15 DIAGNOSIS — F329 Major depressive disorder, single episode, unspecified: Secondary | ICD-10-CM | POA: Diagnosis not present

## 2014-06-15 DIAGNOSIS — F3289 Other specified depressive episodes: Secondary | ICD-10-CM | POA: Diagnosis not present

## 2014-06-15 DIAGNOSIS — M6281 Muscle weakness (generalized): Secondary | ICD-10-CM | POA: Diagnosis not present

## 2014-06-16 DIAGNOSIS — M6281 Muscle weakness (generalized): Secondary | ICD-10-CM | POA: Diagnosis not present

## 2014-06-16 DIAGNOSIS — I509 Heart failure, unspecified: Secondary | ICD-10-CM | POA: Diagnosis not present

## 2014-06-16 DIAGNOSIS — F3289 Other specified depressive episodes: Secondary | ICD-10-CM | POA: Diagnosis not present

## 2014-06-16 DIAGNOSIS — F329 Major depressive disorder, single episode, unspecified: Secondary | ICD-10-CM | POA: Diagnosis not present

## 2014-06-17 DIAGNOSIS — F329 Major depressive disorder, single episode, unspecified: Secondary | ICD-10-CM | POA: Diagnosis not present

## 2014-06-17 DIAGNOSIS — M6281 Muscle weakness (generalized): Secondary | ICD-10-CM | POA: Diagnosis not present

## 2014-06-17 DIAGNOSIS — I4891 Unspecified atrial fibrillation: Secondary | ICD-10-CM | POA: Diagnosis not present

## 2014-06-17 DIAGNOSIS — Z7901 Long term (current) use of anticoagulants: Secondary | ICD-10-CM | POA: Diagnosis not present

## 2014-06-17 DIAGNOSIS — I509 Heart failure, unspecified: Secondary | ICD-10-CM | POA: Diagnosis not present

## 2014-06-17 DIAGNOSIS — F3289 Other specified depressive episodes: Secondary | ICD-10-CM | POA: Diagnosis not present

## 2014-06-20 DIAGNOSIS — F329 Major depressive disorder, single episode, unspecified: Secondary | ICD-10-CM | POA: Diagnosis not present

## 2014-06-20 DIAGNOSIS — I509 Heart failure, unspecified: Secondary | ICD-10-CM | POA: Diagnosis not present

## 2014-06-20 DIAGNOSIS — F3289 Other specified depressive episodes: Secondary | ICD-10-CM | POA: Diagnosis not present

## 2014-06-20 DIAGNOSIS — M6281 Muscle weakness (generalized): Secondary | ICD-10-CM | POA: Diagnosis not present

## 2014-06-21 DIAGNOSIS — F3289 Other specified depressive episodes: Secondary | ICD-10-CM | POA: Diagnosis not present

## 2014-06-21 DIAGNOSIS — F329 Major depressive disorder, single episode, unspecified: Secondary | ICD-10-CM | POA: Diagnosis not present

## 2014-06-21 DIAGNOSIS — M6281 Muscle weakness (generalized): Secondary | ICD-10-CM | POA: Diagnosis not present

## 2014-06-21 DIAGNOSIS — Z79899 Other long term (current) drug therapy: Secondary | ICD-10-CM | POA: Diagnosis not present

## 2014-06-21 DIAGNOSIS — I509 Heart failure, unspecified: Secondary | ICD-10-CM | POA: Diagnosis not present

## 2014-06-22 DIAGNOSIS — F3289 Other specified depressive episodes: Secondary | ICD-10-CM | POA: Diagnosis not present

## 2014-06-22 DIAGNOSIS — N390 Urinary tract infection, site not specified: Secondary | ICD-10-CM | POA: Diagnosis not present

## 2014-06-22 DIAGNOSIS — F329 Major depressive disorder, single episode, unspecified: Secondary | ICD-10-CM | POA: Diagnosis not present

## 2014-06-22 DIAGNOSIS — I509 Heart failure, unspecified: Secondary | ICD-10-CM | POA: Diagnosis not present

## 2014-06-22 DIAGNOSIS — M6281 Muscle weakness (generalized): Secondary | ICD-10-CM | POA: Diagnosis not present

## 2014-06-23 DIAGNOSIS — M6281 Muscle weakness (generalized): Secondary | ICD-10-CM | POA: Diagnosis not present

## 2014-06-23 DIAGNOSIS — I509 Heart failure, unspecified: Secondary | ICD-10-CM | POA: Diagnosis not present

## 2014-06-23 DIAGNOSIS — F3289 Other specified depressive episodes: Secondary | ICD-10-CM | POA: Diagnosis not present

## 2014-06-23 DIAGNOSIS — F329 Major depressive disorder, single episode, unspecified: Secondary | ICD-10-CM | POA: Diagnosis not present

## 2014-06-24 DIAGNOSIS — F329 Major depressive disorder, single episode, unspecified: Secondary | ICD-10-CM | POA: Diagnosis not present

## 2014-06-24 DIAGNOSIS — N390 Urinary tract infection, site not specified: Secondary | ICD-10-CM | POA: Diagnosis not present

## 2014-06-24 DIAGNOSIS — D689 Coagulation defect, unspecified: Secondary | ICD-10-CM | POA: Diagnosis not present

## 2014-06-24 DIAGNOSIS — G40909 Epilepsy, unspecified, not intractable, without status epilepticus: Secondary | ICD-10-CM | POA: Diagnosis not present

## 2014-06-24 DIAGNOSIS — Z8673 Personal history of transient ischemic attack (TIA), and cerebral infarction without residual deficits: Secondary | ICD-10-CM | POA: Diagnosis not present

## 2014-06-24 DIAGNOSIS — M6281 Muscle weakness (generalized): Secondary | ICD-10-CM | POA: Diagnosis not present

## 2014-06-24 DIAGNOSIS — I509 Heart failure, unspecified: Secondary | ICD-10-CM | POA: Diagnosis not present

## 2014-06-24 DIAGNOSIS — F3289 Other specified depressive episodes: Secondary | ICD-10-CM | POA: Diagnosis not present

## 2014-06-27 DIAGNOSIS — F329 Major depressive disorder, single episode, unspecified: Secondary | ICD-10-CM | POA: Diagnosis not present

## 2014-06-27 DIAGNOSIS — I509 Heart failure, unspecified: Secondary | ICD-10-CM | POA: Diagnosis not present

## 2014-06-27 DIAGNOSIS — N390 Urinary tract infection, site not specified: Secondary | ICD-10-CM | POA: Diagnosis not present

## 2014-06-27 DIAGNOSIS — I4891 Unspecified atrial fibrillation: Secondary | ICD-10-CM | POA: Diagnosis not present

## 2014-06-27 DIAGNOSIS — D689 Coagulation defect, unspecified: Secondary | ICD-10-CM | POA: Diagnosis not present

## 2014-06-27 DIAGNOSIS — F3289 Other specified depressive episodes: Secondary | ICD-10-CM | POA: Diagnosis not present

## 2014-06-27 DIAGNOSIS — R569 Unspecified convulsions: Secondary | ICD-10-CM | POA: Diagnosis not present

## 2014-06-27 DIAGNOSIS — G40909 Epilepsy, unspecified, not intractable, without status epilepticus: Secondary | ICD-10-CM | POA: Diagnosis not present

## 2014-06-27 DIAGNOSIS — M6281 Muscle weakness (generalized): Secondary | ICD-10-CM | POA: Diagnosis not present

## 2014-06-27 DIAGNOSIS — Z8673 Personal history of transient ischemic attack (TIA), and cerebral infarction without residual deficits: Secondary | ICD-10-CM | POA: Diagnosis not present

## 2014-06-28 DIAGNOSIS — I509 Heart failure, unspecified: Secondary | ICD-10-CM | POA: Diagnosis not present

## 2014-06-28 DIAGNOSIS — F3289 Other specified depressive episodes: Secondary | ICD-10-CM | POA: Diagnosis not present

## 2014-06-28 DIAGNOSIS — M6281 Muscle weakness (generalized): Secondary | ICD-10-CM | POA: Diagnosis not present

## 2014-06-28 DIAGNOSIS — F329 Major depressive disorder, single episode, unspecified: Secondary | ICD-10-CM | POA: Diagnosis not present

## 2014-06-30 DIAGNOSIS — R569 Unspecified convulsions: Secondary | ICD-10-CM | POA: Diagnosis not present

## 2014-06-30 DIAGNOSIS — D689 Coagulation defect, unspecified: Secondary | ICD-10-CM | POA: Diagnosis not present

## 2014-06-30 DIAGNOSIS — Z8673 Personal history of transient ischemic attack (TIA), and cerebral infarction without residual deficits: Secondary | ICD-10-CM | POA: Diagnosis not present

## 2014-06-30 DIAGNOSIS — N390 Urinary tract infection, site not specified: Secondary | ICD-10-CM | POA: Diagnosis not present

## 2014-06-30 DIAGNOSIS — G40909 Epilepsy, unspecified, not intractable, without status epilepticus: Secondary | ICD-10-CM | POA: Diagnosis not present

## 2014-06-30 DIAGNOSIS — I4891 Unspecified atrial fibrillation: Secondary | ICD-10-CM | POA: Diagnosis not present

## 2014-07-01 DIAGNOSIS — I4891 Unspecified atrial fibrillation: Secondary | ICD-10-CM | POA: Diagnosis not present

## 2014-07-05 DIAGNOSIS — Z8673 Personal history of transient ischemic attack (TIA), and cerebral infarction without residual deficits: Secondary | ICD-10-CM | POA: Diagnosis not present

## 2014-07-05 DIAGNOSIS — R569 Unspecified convulsions: Secondary | ICD-10-CM | POA: Diagnosis not present

## 2014-07-05 DIAGNOSIS — I4891 Unspecified atrial fibrillation: Secondary | ICD-10-CM | POA: Diagnosis not present

## 2014-07-05 DIAGNOSIS — N390 Urinary tract infection, site not specified: Secondary | ICD-10-CM | POA: Diagnosis not present

## 2014-07-05 DIAGNOSIS — D689 Coagulation defect, unspecified: Secondary | ICD-10-CM | POA: Diagnosis not present

## 2014-07-05 DIAGNOSIS — G40909 Epilepsy, unspecified, not intractable, without status epilepticus: Secondary | ICD-10-CM | POA: Diagnosis not present

## 2014-07-12 DIAGNOSIS — Z8673 Personal history of transient ischemic attack (TIA), and cerebral infarction without residual deficits: Secondary | ICD-10-CM | POA: Diagnosis not present

## 2014-07-12 DIAGNOSIS — D689 Coagulation defect, unspecified: Secondary | ICD-10-CM | POA: Diagnosis not present

## 2014-07-12 DIAGNOSIS — I4891 Unspecified atrial fibrillation: Secondary | ICD-10-CM | POA: Diagnosis not present

## 2014-07-13 DIAGNOSIS — B351 Tinea unguium: Secondary | ICD-10-CM | POA: Diagnosis not present

## 2014-07-13 DIAGNOSIS — M79609 Pain in unspecified limb: Secondary | ICD-10-CM | POA: Diagnosis not present

## 2014-07-14 DIAGNOSIS — I509 Heart failure, unspecified: Secondary | ICD-10-CM | POA: Diagnosis not present

## 2014-07-14 DIAGNOSIS — F329 Major depressive disorder, single episode, unspecified: Secondary | ICD-10-CM | POA: Diagnosis not present

## 2014-07-14 DIAGNOSIS — M6281 Muscle weakness (generalized): Secondary | ICD-10-CM | POA: Diagnosis not present

## 2014-07-14 DIAGNOSIS — R41841 Cognitive communication deficit: Secondary | ICD-10-CM | POA: Diagnosis not present

## 2014-07-19 DIAGNOSIS — Z8673 Personal history of transient ischemic attack (TIA), and cerebral infarction without residual deficits: Secondary | ICD-10-CM | POA: Diagnosis not present

## 2014-07-19 DIAGNOSIS — I2510 Atherosclerotic heart disease of native coronary artery without angina pectoris: Secondary | ICD-10-CM | POA: Diagnosis not present

## 2014-07-19 DIAGNOSIS — D689 Coagulation defect, unspecified: Secondary | ICD-10-CM | POA: Diagnosis not present

## 2014-07-26 DIAGNOSIS — Z7901 Long term (current) use of anticoagulants: Secondary | ICD-10-CM | POA: Diagnosis not present

## 2014-07-26 DIAGNOSIS — I2510 Atherosclerotic heart disease of native coronary artery without angina pectoris: Secondary | ICD-10-CM | POA: Diagnosis not present

## 2014-07-26 DIAGNOSIS — D688 Other specified coagulation defects: Secondary | ICD-10-CM | POA: Diagnosis not present

## 2014-07-26 DIAGNOSIS — Z8673 Personal history of transient ischemic attack (TIA), and cerebral infarction without residual deficits: Secondary | ICD-10-CM | POA: Diagnosis not present

## 2014-08-02 DIAGNOSIS — D688 Other specified coagulation defects: Secondary | ICD-10-CM | POA: Diagnosis not present

## 2014-08-02 DIAGNOSIS — I2510 Atherosclerotic heart disease of native coronary artery without angina pectoris: Secondary | ICD-10-CM | POA: Diagnosis not present

## 2014-08-02 DIAGNOSIS — Z7901 Long term (current) use of anticoagulants: Secondary | ICD-10-CM | POA: Diagnosis not present

## 2014-08-02 DIAGNOSIS — Z8673 Personal history of transient ischemic attack (TIA), and cerebral infarction without residual deficits: Secondary | ICD-10-CM | POA: Diagnosis not present

## 2014-08-09 DIAGNOSIS — Z8673 Personal history of transient ischemic attack (TIA), and cerebral infarction without residual deficits: Secondary | ICD-10-CM | POA: Diagnosis not present

## 2014-08-09 DIAGNOSIS — D688 Other specified coagulation defects: Secondary | ICD-10-CM | POA: Diagnosis not present

## 2014-08-09 DIAGNOSIS — Z7901 Long term (current) use of anticoagulants: Secondary | ICD-10-CM | POA: Diagnosis not present

## 2014-08-14 DIAGNOSIS — I509 Heart failure, unspecified: Secondary | ICD-10-CM | POA: Diagnosis not present

## 2014-08-14 DIAGNOSIS — M6281 Muscle weakness (generalized): Secondary | ICD-10-CM | POA: Diagnosis not present

## 2014-08-14 DIAGNOSIS — R41841 Cognitive communication deficit: Secondary | ICD-10-CM | POA: Diagnosis not present

## 2014-08-14 DIAGNOSIS — F329 Major depressive disorder, single episode, unspecified: Secondary | ICD-10-CM | POA: Diagnosis not present

## 2014-08-17 DIAGNOSIS — D689 Coagulation defect, unspecified: Secondary | ICD-10-CM | POA: Diagnosis not present

## 2014-08-17 DIAGNOSIS — Z8673 Personal history of transient ischemic attack (TIA), and cerebral infarction without residual deficits: Secondary | ICD-10-CM | POA: Diagnosis not present

## 2014-08-17 DIAGNOSIS — R319 Hematuria, unspecified: Secondary | ICD-10-CM | POA: Diagnosis not present

## 2014-08-17 DIAGNOSIS — N390 Urinary tract infection, site not specified: Secondary | ICD-10-CM | POA: Diagnosis not present

## 2014-08-17 DIAGNOSIS — R32 Unspecified urinary incontinence: Secondary | ICD-10-CM | POA: Diagnosis not present

## 2014-08-18 DIAGNOSIS — N390 Urinary tract infection, site not specified: Secondary | ICD-10-CM | POA: Diagnosis not present

## 2014-08-18 DIAGNOSIS — Z79899 Other long term (current) drug therapy: Secondary | ICD-10-CM | POA: Diagnosis not present

## 2014-08-19 DIAGNOSIS — Z8673 Personal history of transient ischemic attack (TIA), and cerebral infarction without residual deficits: Secondary | ICD-10-CM | POA: Diagnosis not present

## 2014-08-19 DIAGNOSIS — D689 Coagulation defect, unspecified: Secondary | ICD-10-CM | POA: Diagnosis not present

## 2014-08-19 DIAGNOSIS — D688 Other specified coagulation defects: Secondary | ICD-10-CM | POA: Diagnosis not present

## 2014-08-19 DIAGNOSIS — N390 Urinary tract infection, site not specified: Secondary | ICD-10-CM | POA: Diagnosis not present

## 2014-08-20 DIAGNOSIS — D689 Coagulation defect, unspecified: Secondary | ICD-10-CM | POA: Diagnosis not present

## 2014-08-20 DIAGNOSIS — Z8673 Personal history of transient ischemic attack (TIA), and cerebral infarction without residual deficits: Secondary | ICD-10-CM | POA: Diagnosis not present

## 2014-08-20 DIAGNOSIS — Z7901 Long term (current) use of anticoagulants: Secondary | ICD-10-CM | POA: Diagnosis not present

## 2014-08-20 DIAGNOSIS — D688 Other specified coagulation defects: Secondary | ICD-10-CM | POA: Diagnosis not present

## 2014-08-20 DIAGNOSIS — N390 Urinary tract infection, site not specified: Secondary | ICD-10-CM | POA: Diagnosis not present

## 2014-08-24 DIAGNOSIS — Z8673 Personal history of transient ischemic attack (TIA), and cerebral infarction without residual deficits: Secondary | ICD-10-CM | POA: Diagnosis not present

## 2014-08-24 DIAGNOSIS — D688 Other specified coagulation defects: Secondary | ICD-10-CM | POA: Diagnosis not present

## 2014-08-24 DIAGNOSIS — Z7901 Long term (current) use of anticoagulants: Secondary | ICD-10-CM | POA: Diagnosis not present

## 2014-08-26 DIAGNOSIS — Z8673 Personal history of transient ischemic attack (TIA), and cerebral infarction without residual deficits: Secondary | ICD-10-CM | POA: Diagnosis not present

## 2014-08-26 DIAGNOSIS — D688 Other specified coagulation defects: Secondary | ICD-10-CM | POA: Diagnosis not present

## 2014-08-26 DIAGNOSIS — Z7901 Long term (current) use of anticoagulants: Secondary | ICD-10-CM | POA: Diagnosis not present

## 2014-08-29 DIAGNOSIS — Z7901 Long term (current) use of anticoagulants: Secondary | ICD-10-CM | POA: Diagnosis not present

## 2014-08-29 DIAGNOSIS — D689 Coagulation defect, unspecified: Secondary | ICD-10-CM | POA: Diagnosis not present

## 2014-08-29 DIAGNOSIS — N390 Urinary tract infection, site not specified: Secondary | ICD-10-CM | POA: Diagnosis not present

## 2014-08-29 DIAGNOSIS — D688 Other specified coagulation defects: Secondary | ICD-10-CM | POA: Diagnosis not present

## 2014-08-29 DIAGNOSIS — Z8673 Personal history of transient ischemic attack (TIA), and cerebral infarction without residual deficits: Secondary | ICD-10-CM | POA: Diagnosis not present

## 2014-08-30 DIAGNOSIS — I509 Heart failure, unspecified: Secondary | ICD-10-CM | POA: Diagnosis not present

## 2014-08-30 DIAGNOSIS — Z79899 Other long term (current) drug therapy: Secondary | ICD-10-CM | POA: Diagnosis not present

## 2014-09-01 DIAGNOSIS — Z7901 Long term (current) use of anticoagulants: Secondary | ICD-10-CM | POA: Diagnosis not present

## 2014-09-05 DIAGNOSIS — N390 Urinary tract infection, site not specified: Secondary | ICD-10-CM | POA: Diagnosis not present

## 2014-09-05 DIAGNOSIS — D688 Other specified coagulation defects: Secondary | ICD-10-CM | POA: Diagnosis not present

## 2014-09-05 DIAGNOSIS — Z8673 Personal history of transient ischemic attack (TIA), and cerebral infarction without residual deficits: Secondary | ICD-10-CM | POA: Diagnosis not present

## 2014-09-05 DIAGNOSIS — I2510 Atherosclerotic heart disease of native coronary artery without angina pectoris: Secondary | ICD-10-CM | POA: Diagnosis not present

## 2014-09-05 DIAGNOSIS — Z7901 Long term (current) use of anticoagulants: Secondary | ICD-10-CM | POA: Diagnosis not present

## 2014-09-12 DIAGNOSIS — D689 Coagulation defect, unspecified: Secondary | ICD-10-CM | POA: Diagnosis not present

## 2014-09-12 DIAGNOSIS — N390 Urinary tract infection, site not specified: Secondary | ICD-10-CM | POA: Diagnosis not present

## 2014-09-12 DIAGNOSIS — Z7901 Long term (current) use of anticoagulants: Secondary | ICD-10-CM | POA: Diagnosis not present

## 2014-09-12 DIAGNOSIS — Z8673 Personal history of transient ischemic attack (TIA), and cerebral infarction without residual deficits: Secondary | ICD-10-CM | POA: Diagnosis not present

## 2014-09-14 DIAGNOSIS — M6281 Muscle weakness (generalized): Secondary | ICD-10-CM | POA: Diagnosis not present

## 2014-09-14 DIAGNOSIS — R41841 Cognitive communication deficit: Secondary | ICD-10-CM | POA: Diagnosis not present

## 2014-09-14 DIAGNOSIS — F329 Major depressive disorder, single episode, unspecified: Secondary | ICD-10-CM | POA: Diagnosis not present

## 2014-09-14 DIAGNOSIS — I509 Heart failure, unspecified: Secondary | ICD-10-CM | POA: Diagnosis not present

## 2014-09-15 DIAGNOSIS — I509 Heart failure, unspecified: Secondary | ICD-10-CM | POA: Diagnosis not present

## 2014-09-15 DIAGNOSIS — F329 Major depressive disorder, single episode, unspecified: Secondary | ICD-10-CM | POA: Diagnosis not present

## 2014-09-15 DIAGNOSIS — Z8673 Personal history of transient ischemic attack (TIA), and cerebral infarction without residual deficits: Secondary | ICD-10-CM | POA: Diagnosis not present

## 2014-09-15 DIAGNOSIS — M6281 Muscle weakness (generalized): Secondary | ICD-10-CM | POA: Diagnosis not present

## 2014-09-15 DIAGNOSIS — N390 Urinary tract infection, site not specified: Secondary | ICD-10-CM | POA: Diagnosis not present

## 2014-09-15 DIAGNOSIS — Z7901 Long term (current) use of anticoagulants: Secondary | ICD-10-CM | POA: Diagnosis not present

## 2014-09-15 DIAGNOSIS — D689 Coagulation defect, unspecified: Secondary | ICD-10-CM | POA: Diagnosis not present

## 2014-09-15 DIAGNOSIS — R41841 Cognitive communication deficit: Secondary | ICD-10-CM | POA: Diagnosis not present

## 2014-09-16 DIAGNOSIS — R41841 Cognitive communication deficit: Secondary | ICD-10-CM | POA: Diagnosis not present

## 2014-09-16 DIAGNOSIS — M6281 Muscle weakness (generalized): Secondary | ICD-10-CM | POA: Diagnosis not present

## 2014-09-16 DIAGNOSIS — I509 Heart failure, unspecified: Secondary | ICD-10-CM | POA: Diagnosis not present

## 2014-09-16 DIAGNOSIS — F329 Major depressive disorder, single episode, unspecified: Secondary | ICD-10-CM | POA: Diagnosis not present

## 2014-09-17 DIAGNOSIS — M6281 Muscle weakness (generalized): Secondary | ICD-10-CM | POA: Diagnosis not present

## 2014-09-17 DIAGNOSIS — I509 Heart failure, unspecified: Secondary | ICD-10-CM | POA: Diagnosis not present

## 2014-09-17 DIAGNOSIS — F329 Major depressive disorder, single episode, unspecified: Secondary | ICD-10-CM | POA: Diagnosis not present

## 2014-09-17 DIAGNOSIS — R41841 Cognitive communication deficit: Secondary | ICD-10-CM | POA: Diagnosis not present

## 2014-09-18 DIAGNOSIS — I509 Heart failure, unspecified: Secondary | ICD-10-CM | POA: Diagnosis not present

## 2014-09-18 DIAGNOSIS — M6281 Muscle weakness (generalized): Secondary | ICD-10-CM | POA: Diagnosis not present

## 2014-09-18 DIAGNOSIS — F329 Major depressive disorder, single episode, unspecified: Secondary | ICD-10-CM | POA: Diagnosis not present

## 2014-09-18 DIAGNOSIS — R41841 Cognitive communication deficit: Secondary | ICD-10-CM | POA: Diagnosis not present

## 2014-09-19 DIAGNOSIS — Z8673 Personal history of transient ischemic attack (TIA), and cerebral infarction without residual deficits: Secondary | ICD-10-CM | POA: Diagnosis not present

## 2014-09-19 DIAGNOSIS — R41841 Cognitive communication deficit: Secondary | ICD-10-CM | POA: Diagnosis not present

## 2014-09-19 DIAGNOSIS — Z7901 Long term (current) use of anticoagulants: Secondary | ICD-10-CM | POA: Diagnosis not present

## 2014-09-19 DIAGNOSIS — I2510 Atherosclerotic heart disease of native coronary artery without angina pectoris: Secondary | ICD-10-CM | POA: Diagnosis not present

## 2014-09-19 DIAGNOSIS — D689 Coagulation defect, unspecified: Secondary | ICD-10-CM | POA: Diagnosis not present

## 2014-09-19 DIAGNOSIS — F329 Major depressive disorder, single episode, unspecified: Secondary | ICD-10-CM | POA: Diagnosis not present

## 2014-09-19 DIAGNOSIS — M6281 Muscle weakness (generalized): Secondary | ICD-10-CM | POA: Diagnosis not present

## 2014-09-19 DIAGNOSIS — I509 Heart failure, unspecified: Secondary | ICD-10-CM | POA: Diagnosis not present

## 2014-09-20 DIAGNOSIS — M6281 Muscle weakness (generalized): Secondary | ICD-10-CM | POA: Diagnosis not present

## 2014-09-20 DIAGNOSIS — R41841 Cognitive communication deficit: Secondary | ICD-10-CM | POA: Diagnosis not present

## 2014-09-20 DIAGNOSIS — I509 Heart failure, unspecified: Secondary | ICD-10-CM | POA: Diagnosis not present

## 2014-09-20 DIAGNOSIS — F329 Major depressive disorder, single episode, unspecified: Secondary | ICD-10-CM | POA: Diagnosis not present

## 2014-09-21 DIAGNOSIS — R41841 Cognitive communication deficit: Secondary | ICD-10-CM | POA: Diagnosis not present

## 2014-09-21 DIAGNOSIS — F329 Major depressive disorder, single episode, unspecified: Secondary | ICD-10-CM | POA: Diagnosis not present

## 2014-09-21 DIAGNOSIS — M6281 Muscle weakness (generalized): Secondary | ICD-10-CM | POA: Diagnosis not present

## 2014-09-21 DIAGNOSIS — I509 Heart failure, unspecified: Secondary | ICD-10-CM | POA: Diagnosis not present

## 2014-09-23 DIAGNOSIS — I509 Heart failure, unspecified: Secondary | ICD-10-CM | POA: Diagnosis not present

## 2014-09-23 DIAGNOSIS — M6281 Muscle weakness (generalized): Secondary | ICD-10-CM | POA: Diagnosis not present

## 2014-09-23 DIAGNOSIS — F329 Major depressive disorder, single episode, unspecified: Secondary | ICD-10-CM | POA: Diagnosis not present

## 2014-09-23 DIAGNOSIS — R41841 Cognitive communication deficit: Secondary | ICD-10-CM | POA: Diagnosis not present

## 2014-09-25 DIAGNOSIS — R41841 Cognitive communication deficit: Secondary | ICD-10-CM | POA: Diagnosis not present

## 2014-09-25 DIAGNOSIS — F329 Major depressive disorder, single episode, unspecified: Secondary | ICD-10-CM | POA: Diagnosis not present

## 2014-09-25 DIAGNOSIS — M6281 Muscle weakness (generalized): Secondary | ICD-10-CM | POA: Diagnosis not present

## 2014-09-25 DIAGNOSIS — I509 Heart failure, unspecified: Secondary | ICD-10-CM | POA: Diagnosis not present

## 2014-09-26 DIAGNOSIS — R41841 Cognitive communication deficit: Secondary | ICD-10-CM | POA: Diagnosis not present

## 2014-09-26 DIAGNOSIS — M6281 Muscle weakness (generalized): Secondary | ICD-10-CM | POA: Diagnosis not present

## 2014-09-26 DIAGNOSIS — D689 Coagulation defect, unspecified: Secondary | ICD-10-CM | POA: Diagnosis not present

## 2014-09-26 DIAGNOSIS — Z8673 Personal history of transient ischemic attack (TIA), and cerebral infarction without residual deficits: Secondary | ICD-10-CM | POA: Diagnosis not present

## 2014-09-26 DIAGNOSIS — F329 Major depressive disorder, single episode, unspecified: Secondary | ICD-10-CM | POA: Diagnosis not present

## 2014-09-26 DIAGNOSIS — Z7901 Long term (current) use of anticoagulants: Secondary | ICD-10-CM | POA: Diagnosis not present

## 2014-09-26 DIAGNOSIS — I509 Heart failure, unspecified: Secondary | ICD-10-CM | POA: Diagnosis not present

## 2014-09-28 DIAGNOSIS — M79674 Pain in right toe(s): Secondary | ICD-10-CM | POA: Diagnosis not present

## 2014-09-28 DIAGNOSIS — F329 Major depressive disorder, single episode, unspecified: Secondary | ICD-10-CM | POA: Diagnosis not present

## 2014-09-28 DIAGNOSIS — I509 Heart failure, unspecified: Secondary | ICD-10-CM | POA: Diagnosis not present

## 2014-09-28 DIAGNOSIS — R41841 Cognitive communication deficit: Secondary | ICD-10-CM | POA: Diagnosis not present

## 2014-09-28 DIAGNOSIS — B351 Tinea unguium: Secondary | ICD-10-CM | POA: Diagnosis not present

## 2014-09-28 DIAGNOSIS — M6281 Muscle weakness (generalized): Secondary | ICD-10-CM | POA: Diagnosis not present

## 2014-09-29 DIAGNOSIS — M6281 Muscle weakness (generalized): Secondary | ICD-10-CM | POA: Diagnosis not present

## 2014-09-29 DIAGNOSIS — I509 Heart failure, unspecified: Secondary | ICD-10-CM | POA: Diagnosis not present

## 2014-09-29 DIAGNOSIS — R41841 Cognitive communication deficit: Secondary | ICD-10-CM | POA: Diagnosis not present

## 2014-09-29 DIAGNOSIS — F329 Major depressive disorder, single episode, unspecified: Secondary | ICD-10-CM | POA: Diagnosis not present

## 2014-09-30 DIAGNOSIS — I509 Heart failure, unspecified: Secondary | ICD-10-CM | POA: Diagnosis not present

## 2014-09-30 DIAGNOSIS — F329 Major depressive disorder, single episode, unspecified: Secondary | ICD-10-CM | POA: Diagnosis not present

## 2014-09-30 DIAGNOSIS — R41841 Cognitive communication deficit: Secondary | ICD-10-CM | POA: Diagnosis not present

## 2014-09-30 DIAGNOSIS — M6281 Muscle weakness (generalized): Secondary | ICD-10-CM | POA: Diagnosis not present

## 2014-10-02 DIAGNOSIS — I509 Heart failure, unspecified: Secondary | ICD-10-CM | POA: Diagnosis not present

## 2014-10-02 DIAGNOSIS — R41841 Cognitive communication deficit: Secondary | ICD-10-CM | POA: Diagnosis not present

## 2014-10-02 DIAGNOSIS — M6281 Muscle weakness (generalized): Secondary | ICD-10-CM | POA: Diagnosis not present

## 2014-10-02 DIAGNOSIS — F329 Major depressive disorder, single episode, unspecified: Secondary | ICD-10-CM | POA: Diagnosis not present

## 2014-10-03 DIAGNOSIS — D689 Coagulation defect, unspecified: Secondary | ICD-10-CM | POA: Diagnosis not present

## 2014-10-03 DIAGNOSIS — Z8673 Personal history of transient ischemic attack (TIA), and cerebral infarction without residual deficits: Secondary | ICD-10-CM | POA: Diagnosis not present

## 2014-10-03 DIAGNOSIS — Z7901 Long term (current) use of anticoagulants: Secondary | ICD-10-CM | POA: Diagnosis not present

## 2014-10-04 DIAGNOSIS — I509 Heart failure, unspecified: Secondary | ICD-10-CM | POA: Diagnosis not present

## 2014-10-04 DIAGNOSIS — M6281 Muscle weakness (generalized): Secondary | ICD-10-CM | POA: Diagnosis not present

## 2014-10-04 DIAGNOSIS — R41841 Cognitive communication deficit: Secondary | ICD-10-CM | POA: Diagnosis not present

## 2014-10-04 DIAGNOSIS — F329 Major depressive disorder, single episode, unspecified: Secondary | ICD-10-CM | POA: Diagnosis not present

## 2014-10-05 DIAGNOSIS — I509 Heart failure, unspecified: Secondary | ICD-10-CM | POA: Diagnosis not present

## 2014-10-05 DIAGNOSIS — M6281 Muscle weakness (generalized): Secondary | ICD-10-CM | POA: Diagnosis not present

## 2014-10-05 DIAGNOSIS — F329 Major depressive disorder, single episode, unspecified: Secondary | ICD-10-CM | POA: Diagnosis not present

## 2014-10-05 DIAGNOSIS — R41841 Cognitive communication deficit: Secondary | ICD-10-CM | POA: Diagnosis not present

## 2014-10-06 DIAGNOSIS — I509 Heart failure, unspecified: Secondary | ICD-10-CM | POA: Diagnosis not present

## 2014-10-06 DIAGNOSIS — F329 Major depressive disorder, single episode, unspecified: Secondary | ICD-10-CM | POA: Diagnosis not present

## 2014-10-06 DIAGNOSIS — M6281 Muscle weakness (generalized): Secondary | ICD-10-CM | POA: Diagnosis not present

## 2014-10-06 DIAGNOSIS — D689 Coagulation defect, unspecified: Secondary | ICD-10-CM | POA: Diagnosis not present

## 2014-10-06 DIAGNOSIS — R41841 Cognitive communication deficit: Secondary | ICD-10-CM | POA: Diagnosis not present

## 2014-10-06 DIAGNOSIS — Z8673 Personal history of transient ischemic attack (TIA), and cerebral infarction without residual deficits: Secondary | ICD-10-CM | POA: Diagnosis not present

## 2014-10-06 DIAGNOSIS — Z7901 Long term (current) use of anticoagulants: Secondary | ICD-10-CM | POA: Diagnosis not present

## 2014-10-08 DIAGNOSIS — R41841 Cognitive communication deficit: Secondary | ICD-10-CM | POA: Diagnosis not present

## 2014-10-08 DIAGNOSIS — M6281 Muscle weakness (generalized): Secondary | ICD-10-CM | POA: Diagnosis not present

## 2014-10-08 DIAGNOSIS — F329 Major depressive disorder, single episode, unspecified: Secondary | ICD-10-CM | POA: Diagnosis not present

## 2014-10-08 DIAGNOSIS — I509 Heart failure, unspecified: Secondary | ICD-10-CM | POA: Diagnosis not present

## 2014-10-09 DIAGNOSIS — I509 Heart failure, unspecified: Secondary | ICD-10-CM | POA: Diagnosis not present

## 2014-10-09 DIAGNOSIS — M6281 Muscle weakness (generalized): Secondary | ICD-10-CM | POA: Diagnosis not present

## 2014-10-09 DIAGNOSIS — F329 Major depressive disorder, single episode, unspecified: Secondary | ICD-10-CM | POA: Diagnosis not present

## 2014-10-09 DIAGNOSIS — R41841 Cognitive communication deficit: Secondary | ICD-10-CM | POA: Diagnosis not present

## 2014-10-10 DIAGNOSIS — Z8673 Personal history of transient ischemic attack (TIA), and cerebral infarction without residual deficits: Secondary | ICD-10-CM | POA: Diagnosis not present

## 2014-10-10 DIAGNOSIS — D689 Coagulation defect, unspecified: Secondary | ICD-10-CM | POA: Diagnosis not present

## 2014-10-10 DIAGNOSIS — Z7901 Long term (current) use of anticoagulants: Secondary | ICD-10-CM | POA: Diagnosis not present

## 2014-10-11 DIAGNOSIS — I509 Heart failure, unspecified: Secondary | ICD-10-CM | POA: Diagnosis not present

## 2014-10-11 DIAGNOSIS — F329 Major depressive disorder, single episode, unspecified: Secondary | ICD-10-CM | POA: Diagnosis not present

## 2014-10-11 DIAGNOSIS — R41841 Cognitive communication deficit: Secondary | ICD-10-CM | POA: Diagnosis not present

## 2014-10-11 DIAGNOSIS — M6281 Muscle weakness (generalized): Secondary | ICD-10-CM | POA: Diagnosis not present

## 2014-10-12 DIAGNOSIS — F329 Major depressive disorder, single episode, unspecified: Secondary | ICD-10-CM | POA: Diagnosis not present

## 2014-10-12 DIAGNOSIS — R41841 Cognitive communication deficit: Secondary | ICD-10-CM | POA: Diagnosis not present

## 2014-10-12 DIAGNOSIS — M6281 Muscle weakness (generalized): Secondary | ICD-10-CM | POA: Diagnosis not present

## 2014-10-12 DIAGNOSIS — I509 Heart failure, unspecified: Secondary | ICD-10-CM | POA: Diagnosis not present

## 2014-10-13 DIAGNOSIS — F329 Major depressive disorder, single episode, unspecified: Secondary | ICD-10-CM | POA: Diagnosis not present

## 2014-10-13 DIAGNOSIS — I509 Heart failure, unspecified: Secondary | ICD-10-CM | POA: Diagnosis not present

## 2014-10-13 DIAGNOSIS — Z7901 Long term (current) use of anticoagulants: Secondary | ICD-10-CM | POA: Diagnosis not present

## 2014-10-13 DIAGNOSIS — M6281 Muscle weakness (generalized): Secondary | ICD-10-CM | POA: Diagnosis not present

## 2014-10-13 DIAGNOSIS — Z8673 Personal history of transient ischemic attack (TIA), and cerebral infarction without residual deficits: Secondary | ICD-10-CM | POA: Diagnosis not present

## 2014-10-13 DIAGNOSIS — D689 Coagulation defect, unspecified: Secondary | ICD-10-CM | POA: Diagnosis not present

## 2014-10-13 DIAGNOSIS — R41841 Cognitive communication deficit: Secondary | ICD-10-CM | POA: Diagnosis not present

## 2014-10-15 DIAGNOSIS — F329 Major depressive disorder, single episode, unspecified: Secondary | ICD-10-CM | POA: Diagnosis not present

## 2014-10-15 DIAGNOSIS — R41841 Cognitive communication deficit: Secondary | ICD-10-CM | POA: Diagnosis not present

## 2014-10-15 DIAGNOSIS — I509 Heart failure, unspecified: Secondary | ICD-10-CM | POA: Diagnosis not present

## 2014-10-15 DIAGNOSIS — M6281 Muscle weakness (generalized): Secondary | ICD-10-CM | POA: Diagnosis not present

## 2014-10-15 DIAGNOSIS — R293 Abnormal posture: Secondary | ICD-10-CM | POA: Diagnosis not present

## 2014-10-16 DIAGNOSIS — R41841 Cognitive communication deficit: Secondary | ICD-10-CM | POA: Diagnosis not present

## 2014-10-16 DIAGNOSIS — I509 Heart failure, unspecified: Secondary | ICD-10-CM | POA: Diagnosis not present

## 2014-10-16 DIAGNOSIS — F329 Major depressive disorder, single episode, unspecified: Secondary | ICD-10-CM | POA: Diagnosis not present

## 2014-10-16 DIAGNOSIS — M6281 Muscle weakness (generalized): Secondary | ICD-10-CM | POA: Diagnosis not present

## 2014-10-16 DIAGNOSIS — R293 Abnormal posture: Secondary | ICD-10-CM | POA: Diagnosis not present

## 2014-10-18 DIAGNOSIS — D689 Coagulation defect, unspecified: Secondary | ICD-10-CM | POA: Diagnosis not present

## 2014-10-18 DIAGNOSIS — F329 Major depressive disorder, single episode, unspecified: Secondary | ICD-10-CM | POA: Diagnosis not present

## 2014-10-18 DIAGNOSIS — R41841 Cognitive communication deficit: Secondary | ICD-10-CM | POA: Diagnosis not present

## 2014-10-18 DIAGNOSIS — M6281 Muscle weakness (generalized): Secondary | ICD-10-CM | POA: Diagnosis not present

## 2014-10-18 DIAGNOSIS — R293 Abnormal posture: Secondary | ICD-10-CM | POA: Diagnosis not present

## 2014-10-18 DIAGNOSIS — Z8673 Personal history of transient ischemic attack (TIA), and cerebral infarction without residual deficits: Secondary | ICD-10-CM | POA: Diagnosis not present

## 2014-10-18 DIAGNOSIS — Z7901 Long term (current) use of anticoagulants: Secondary | ICD-10-CM | POA: Diagnosis not present

## 2014-10-18 DIAGNOSIS — I509 Heart failure, unspecified: Secondary | ICD-10-CM | POA: Diagnosis not present

## 2014-10-19 DIAGNOSIS — I509 Heart failure, unspecified: Secondary | ICD-10-CM | POA: Diagnosis not present

## 2014-10-19 DIAGNOSIS — R41841 Cognitive communication deficit: Secondary | ICD-10-CM | POA: Diagnosis not present

## 2014-10-19 DIAGNOSIS — R293 Abnormal posture: Secondary | ICD-10-CM | POA: Diagnosis not present

## 2014-10-19 DIAGNOSIS — M6281 Muscle weakness (generalized): Secondary | ICD-10-CM | POA: Diagnosis not present

## 2014-10-19 DIAGNOSIS — F329 Major depressive disorder, single episode, unspecified: Secondary | ICD-10-CM | POA: Diagnosis not present

## 2014-10-20 DIAGNOSIS — R41841 Cognitive communication deficit: Secondary | ICD-10-CM | POA: Diagnosis not present

## 2014-10-20 DIAGNOSIS — R293 Abnormal posture: Secondary | ICD-10-CM | POA: Diagnosis not present

## 2014-10-20 DIAGNOSIS — I509 Heart failure, unspecified: Secondary | ICD-10-CM | POA: Diagnosis not present

## 2014-10-20 DIAGNOSIS — M6281 Muscle weakness (generalized): Secondary | ICD-10-CM | POA: Diagnosis not present

## 2014-10-20 DIAGNOSIS — F329 Major depressive disorder, single episode, unspecified: Secondary | ICD-10-CM | POA: Diagnosis not present

## 2014-10-21 DIAGNOSIS — I509 Heart failure, unspecified: Secondary | ICD-10-CM | POA: Diagnosis not present

## 2014-10-21 DIAGNOSIS — F329 Major depressive disorder, single episode, unspecified: Secondary | ICD-10-CM | POA: Diagnosis not present

## 2014-10-21 DIAGNOSIS — M6281 Muscle weakness (generalized): Secondary | ICD-10-CM | POA: Diagnosis not present

## 2014-10-21 DIAGNOSIS — R293 Abnormal posture: Secondary | ICD-10-CM | POA: Diagnosis not present

## 2014-10-21 DIAGNOSIS — Z8673 Personal history of transient ischemic attack (TIA), and cerebral infarction without residual deficits: Secondary | ICD-10-CM | POA: Diagnosis not present

## 2014-10-21 DIAGNOSIS — D689 Coagulation defect, unspecified: Secondary | ICD-10-CM | POA: Diagnosis not present

## 2014-10-21 DIAGNOSIS — R41841 Cognitive communication deficit: Secondary | ICD-10-CM | POA: Diagnosis not present

## 2014-10-21 DIAGNOSIS — Z7901 Long term (current) use of anticoagulants: Secondary | ICD-10-CM | POA: Diagnosis not present

## 2014-10-21 LAB — TSH: TSH: 22.79 u[IU]/mL — AB (ref <=5.90)

## 2014-10-23 DIAGNOSIS — R41841 Cognitive communication deficit: Secondary | ICD-10-CM | POA: Diagnosis not present

## 2014-10-23 DIAGNOSIS — F329 Major depressive disorder, single episode, unspecified: Secondary | ICD-10-CM | POA: Diagnosis not present

## 2014-10-23 DIAGNOSIS — M6281 Muscle weakness (generalized): Secondary | ICD-10-CM | POA: Diagnosis not present

## 2014-10-23 DIAGNOSIS — R293 Abnormal posture: Secondary | ICD-10-CM | POA: Diagnosis not present

## 2014-10-23 DIAGNOSIS — I509 Heart failure, unspecified: Secondary | ICD-10-CM | POA: Diagnosis not present

## 2014-10-24 DIAGNOSIS — Z7901 Long term (current) use of anticoagulants: Secondary | ICD-10-CM | POA: Diagnosis not present

## 2014-10-24 DIAGNOSIS — R41841 Cognitive communication deficit: Secondary | ICD-10-CM | POA: Diagnosis not present

## 2014-10-24 DIAGNOSIS — I509 Heart failure, unspecified: Secondary | ICD-10-CM | POA: Diagnosis not present

## 2014-10-24 DIAGNOSIS — M6281 Muscle weakness (generalized): Secondary | ICD-10-CM | POA: Diagnosis not present

## 2014-10-24 DIAGNOSIS — R293 Abnormal posture: Secondary | ICD-10-CM | POA: Diagnosis not present

## 2014-10-24 DIAGNOSIS — F329 Major depressive disorder, single episode, unspecified: Secondary | ICD-10-CM | POA: Diagnosis not present

## 2014-10-25 DIAGNOSIS — R41841 Cognitive communication deficit: Secondary | ICD-10-CM | POA: Diagnosis not present

## 2014-10-25 DIAGNOSIS — F329 Major depressive disorder, single episode, unspecified: Secondary | ICD-10-CM | POA: Diagnosis not present

## 2014-10-25 DIAGNOSIS — I509 Heart failure, unspecified: Secondary | ICD-10-CM | POA: Diagnosis not present

## 2014-10-25 DIAGNOSIS — M6281 Muscle weakness (generalized): Secondary | ICD-10-CM | POA: Diagnosis not present

## 2014-10-25 DIAGNOSIS — R293 Abnormal posture: Secondary | ICD-10-CM | POA: Diagnosis not present

## 2014-10-26 DIAGNOSIS — I509 Heart failure, unspecified: Secondary | ICD-10-CM | POA: Diagnosis not present

## 2014-10-26 DIAGNOSIS — R41841 Cognitive communication deficit: Secondary | ICD-10-CM | POA: Diagnosis not present

## 2014-10-26 DIAGNOSIS — R293 Abnormal posture: Secondary | ICD-10-CM | POA: Diagnosis not present

## 2014-10-26 DIAGNOSIS — F329 Major depressive disorder, single episode, unspecified: Secondary | ICD-10-CM | POA: Diagnosis not present

## 2014-10-26 DIAGNOSIS — M6281 Muscle weakness (generalized): Secondary | ICD-10-CM | POA: Diagnosis not present

## 2014-10-27 DIAGNOSIS — R293 Abnormal posture: Secondary | ICD-10-CM | POA: Diagnosis not present

## 2014-10-27 DIAGNOSIS — R309 Painful micturition, unspecified: Secondary | ICD-10-CM | POA: Diagnosis not present

## 2014-10-27 DIAGNOSIS — I509 Heart failure, unspecified: Secondary | ICD-10-CM | POA: Diagnosis not present

## 2014-10-27 DIAGNOSIS — K120 Recurrent oral aphthae: Secondary | ICD-10-CM | POA: Diagnosis not present

## 2014-10-27 DIAGNOSIS — M6281 Muscle weakness (generalized): Secondary | ICD-10-CM | POA: Diagnosis not present

## 2014-10-27 DIAGNOSIS — R41841 Cognitive communication deficit: Secondary | ICD-10-CM | POA: Diagnosis not present

## 2014-10-27 DIAGNOSIS — F329 Major depressive disorder, single episode, unspecified: Secondary | ICD-10-CM | POA: Diagnosis not present

## 2014-10-28 DIAGNOSIS — D649 Anemia, unspecified: Secondary | ICD-10-CM | POA: Diagnosis not present

## 2014-10-28 DIAGNOSIS — F329 Major depressive disorder, single episode, unspecified: Secondary | ICD-10-CM | POA: Diagnosis not present

## 2014-10-28 DIAGNOSIS — R41841 Cognitive communication deficit: Secondary | ICD-10-CM | POA: Diagnosis not present

## 2014-10-28 DIAGNOSIS — M6281 Muscle weakness (generalized): Secondary | ICD-10-CM | POA: Diagnosis not present

## 2014-10-28 DIAGNOSIS — I509 Heart failure, unspecified: Secondary | ICD-10-CM | POA: Diagnosis not present

## 2014-10-28 DIAGNOSIS — K120 Recurrent oral aphthae: Secondary | ICD-10-CM | POA: Diagnosis not present

## 2014-10-28 DIAGNOSIS — D689 Coagulation defect, unspecified: Secondary | ICD-10-CM | POA: Diagnosis not present

## 2014-10-28 DIAGNOSIS — Z79899 Other long term (current) drug therapy: Secondary | ICD-10-CM | POA: Diagnosis not present

## 2014-10-28 DIAGNOSIS — N390 Urinary tract infection, site not specified: Secondary | ICD-10-CM | POA: Diagnosis not present

## 2014-10-28 DIAGNOSIS — R319 Hematuria, unspecified: Secondary | ICD-10-CM | POA: Diagnosis not present

## 2014-10-28 DIAGNOSIS — N182 Chronic kidney disease, stage 2 (mild): Secondary | ICD-10-CM | POA: Diagnosis not present

## 2014-10-28 DIAGNOSIS — R293 Abnormal posture: Secondary | ICD-10-CM | POA: Diagnosis not present

## 2014-10-30 DIAGNOSIS — F329 Major depressive disorder, single episode, unspecified: Secondary | ICD-10-CM | POA: Diagnosis not present

## 2014-10-30 DIAGNOSIS — M6281 Muscle weakness (generalized): Secondary | ICD-10-CM | POA: Diagnosis not present

## 2014-10-30 DIAGNOSIS — R293 Abnormal posture: Secondary | ICD-10-CM | POA: Diagnosis not present

## 2014-10-30 DIAGNOSIS — R41841 Cognitive communication deficit: Secondary | ICD-10-CM | POA: Diagnosis not present

## 2014-10-30 DIAGNOSIS — I509 Heart failure, unspecified: Secondary | ICD-10-CM | POA: Diagnosis not present

## 2014-10-31 DIAGNOSIS — I509 Heart failure, unspecified: Secondary | ICD-10-CM | POA: Diagnosis not present

## 2014-10-31 DIAGNOSIS — K120 Recurrent oral aphthae: Secondary | ICD-10-CM | POA: Diagnosis not present

## 2014-10-31 DIAGNOSIS — Z7901 Long term (current) use of anticoagulants: Secondary | ICD-10-CM | POA: Diagnosis not present

## 2014-10-31 DIAGNOSIS — N390 Urinary tract infection, site not specified: Secondary | ICD-10-CM | POA: Diagnosis not present

## 2014-10-31 DIAGNOSIS — I2510 Atherosclerotic heart disease of native coronary artery without angina pectoris: Secondary | ICD-10-CM | POA: Diagnosis not present

## 2014-11-01 DIAGNOSIS — K120 Recurrent oral aphthae: Secondary | ICD-10-CM | POA: Diagnosis not present

## 2014-11-01 DIAGNOSIS — R293 Abnormal posture: Secondary | ICD-10-CM | POA: Diagnosis not present

## 2014-11-01 DIAGNOSIS — R41841 Cognitive communication deficit: Secondary | ICD-10-CM | POA: Diagnosis not present

## 2014-11-01 DIAGNOSIS — M6281 Muscle weakness (generalized): Secondary | ICD-10-CM | POA: Diagnosis not present

## 2014-11-01 DIAGNOSIS — F329 Major depressive disorder, single episode, unspecified: Secondary | ICD-10-CM | POA: Diagnosis not present

## 2014-11-01 DIAGNOSIS — N390 Urinary tract infection, site not specified: Secondary | ICD-10-CM | POA: Diagnosis not present

## 2014-11-01 DIAGNOSIS — I509 Heart failure, unspecified: Secondary | ICD-10-CM | POA: Diagnosis not present

## 2014-11-02 DIAGNOSIS — F329 Major depressive disorder, single episode, unspecified: Secondary | ICD-10-CM | POA: Diagnosis not present

## 2014-11-02 DIAGNOSIS — R41841 Cognitive communication deficit: Secondary | ICD-10-CM | POA: Diagnosis not present

## 2014-11-02 DIAGNOSIS — M6281 Muscle weakness (generalized): Secondary | ICD-10-CM | POA: Diagnosis not present

## 2014-11-02 DIAGNOSIS — R293 Abnormal posture: Secondary | ICD-10-CM | POA: Diagnosis not present

## 2014-11-02 DIAGNOSIS — I509 Heart failure, unspecified: Secondary | ICD-10-CM | POA: Diagnosis not present

## 2014-11-03 DIAGNOSIS — D689 Coagulation defect, unspecified: Secondary | ICD-10-CM | POA: Diagnosis not present

## 2014-11-03 DIAGNOSIS — R293 Abnormal posture: Secondary | ICD-10-CM | POA: Diagnosis not present

## 2014-11-03 DIAGNOSIS — R41841 Cognitive communication deficit: Secondary | ICD-10-CM | POA: Diagnosis not present

## 2014-11-03 DIAGNOSIS — Z7901 Long term (current) use of anticoagulants: Secondary | ICD-10-CM | POA: Diagnosis not present

## 2014-11-03 DIAGNOSIS — I10 Essential (primary) hypertension: Secondary | ICD-10-CM | POA: Diagnosis not present

## 2014-11-03 DIAGNOSIS — I5030 Unspecified diastolic (congestive) heart failure: Secondary | ICD-10-CM | POA: Diagnosis not present

## 2014-11-03 DIAGNOSIS — E559 Vitamin D deficiency, unspecified: Secondary | ICD-10-CM | POA: Diagnosis not present

## 2014-11-03 DIAGNOSIS — F329 Major depressive disorder, single episode, unspecified: Secondary | ICD-10-CM | POA: Diagnosis not present

## 2014-11-03 DIAGNOSIS — I509 Heart failure, unspecified: Secondary | ICD-10-CM | POA: Diagnosis not present

## 2014-11-03 DIAGNOSIS — M6281 Muscle weakness (generalized): Secondary | ICD-10-CM | POA: Diagnosis not present

## 2014-11-03 DIAGNOSIS — N390 Urinary tract infection, site not specified: Secondary | ICD-10-CM | POA: Diagnosis not present

## 2014-11-03 DIAGNOSIS — Z8673 Personal history of transient ischemic attack (TIA), and cerebral infarction without residual deficits: Secondary | ICD-10-CM | POA: Diagnosis not present

## 2014-11-03 DIAGNOSIS — E039 Hypothyroidism, unspecified: Secondary | ICD-10-CM | POA: Diagnosis not present

## 2014-11-04 DIAGNOSIS — F329 Major depressive disorder, single episode, unspecified: Secondary | ICD-10-CM | POA: Diagnosis not present

## 2014-11-04 DIAGNOSIS — M6281 Muscle weakness (generalized): Secondary | ICD-10-CM | POA: Diagnosis not present

## 2014-11-04 DIAGNOSIS — D649 Anemia, unspecified: Secondary | ICD-10-CM | POA: Diagnosis not present

## 2014-11-04 DIAGNOSIS — R293 Abnormal posture: Secondary | ICD-10-CM | POA: Diagnosis not present

## 2014-11-04 DIAGNOSIS — I509 Heart failure, unspecified: Secondary | ICD-10-CM | POA: Diagnosis not present

## 2014-11-04 DIAGNOSIS — R41841 Cognitive communication deficit: Secondary | ICD-10-CM | POA: Diagnosis not present

## 2014-11-04 DIAGNOSIS — Z79899 Other long term (current) drug therapy: Secondary | ICD-10-CM | POA: Diagnosis not present

## 2014-11-06 DIAGNOSIS — Z7901 Long term (current) use of anticoagulants: Secondary | ICD-10-CM | POA: Diagnosis not present

## 2014-11-06 DIAGNOSIS — D689 Coagulation defect, unspecified: Secondary | ICD-10-CM | POA: Diagnosis not present

## 2014-11-06 DIAGNOSIS — I509 Heart failure, unspecified: Secondary | ICD-10-CM | POA: Diagnosis not present

## 2014-11-06 DIAGNOSIS — E039 Hypothyroidism, unspecified: Secondary | ICD-10-CM | POA: Diagnosis not present

## 2014-11-06 DIAGNOSIS — Z8673 Personal history of transient ischemic attack (TIA), and cerebral infarction without residual deficits: Secondary | ICD-10-CM | POA: Diagnosis not present

## 2014-11-06 DIAGNOSIS — I10 Essential (primary) hypertension: Secondary | ICD-10-CM | POA: Diagnosis not present

## 2014-11-06 DIAGNOSIS — N390 Urinary tract infection, site not specified: Secondary | ICD-10-CM | POA: Diagnosis not present

## 2014-11-06 DIAGNOSIS — E559 Vitamin D deficiency, unspecified: Secondary | ICD-10-CM | POA: Diagnosis not present

## 2014-11-07 DIAGNOSIS — D689 Coagulation defect, unspecified: Secondary | ICD-10-CM | POA: Diagnosis not present

## 2014-11-07 DIAGNOSIS — N390 Urinary tract infection, site not specified: Secondary | ICD-10-CM | POA: Diagnosis not present

## 2014-11-07 DIAGNOSIS — E559 Vitamin D deficiency, unspecified: Secondary | ICD-10-CM | POA: Diagnosis not present

## 2014-11-07 DIAGNOSIS — R293 Abnormal posture: Secondary | ICD-10-CM | POA: Diagnosis not present

## 2014-11-07 DIAGNOSIS — E039 Hypothyroidism, unspecified: Secondary | ICD-10-CM | POA: Diagnosis not present

## 2014-11-07 DIAGNOSIS — Z8673 Personal history of transient ischemic attack (TIA), and cerebral infarction without residual deficits: Secondary | ICD-10-CM | POA: Diagnosis not present

## 2014-11-07 DIAGNOSIS — I509 Heart failure, unspecified: Secondary | ICD-10-CM | POA: Diagnosis not present

## 2014-11-07 DIAGNOSIS — R41841 Cognitive communication deficit: Secondary | ICD-10-CM | POA: Diagnosis not present

## 2014-11-07 DIAGNOSIS — F329 Major depressive disorder, single episode, unspecified: Secondary | ICD-10-CM | POA: Diagnosis not present

## 2014-11-07 DIAGNOSIS — M6281 Muscle weakness (generalized): Secondary | ICD-10-CM | POA: Diagnosis not present

## 2014-11-07 DIAGNOSIS — I10 Essential (primary) hypertension: Secondary | ICD-10-CM | POA: Diagnosis not present

## 2014-11-08 DIAGNOSIS — F329 Major depressive disorder, single episode, unspecified: Secondary | ICD-10-CM | POA: Diagnosis not present

## 2014-11-08 DIAGNOSIS — M6281 Muscle weakness (generalized): Secondary | ICD-10-CM | POA: Diagnosis not present

## 2014-11-08 DIAGNOSIS — R293 Abnormal posture: Secondary | ICD-10-CM | POA: Diagnosis not present

## 2014-11-08 DIAGNOSIS — F411 Generalized anxiety disorder: Secondary | ICD-10-CM | POA: Diagnosis not present

## 2014-11-08 DIAGNOSIS — R41841 Cognitive communication deficit: Secondary | ICD-10-CM | POA: Diagnosis not present

## 2014-11-08 DIAGNOSIS — I509 Heart failure, unspecified: Secondary | ICD-10-CM | POA: Diagnosis not present

## 2014-11-08 DIAGNOSIS — F332 Major depressive disorder, recurrent severe without psychotic features: Secondary | ICD-10-CM | POA: Diagnosis not present

## 2014-11-09 DIAGNOSIS — D689 Coagulation defect, unspecified: Secondary | ICD-10-CM | POA: Diagnosis not present

## 2014-11-09 DIAGNOSIS — I509 Heart failure, unspecified: Secondary | ICD-10-CM | POA: Diagnosis not present

## 2014-11-09 DIAGNOSIS — R293 Abnormal posture: Secondary | ICD-10-CM | POA: Diagnosis not present

## 2014-11-09 DIAGNOSIS — F329 Major depressive disorder, single episode, unspecified: Secondary | ICD-10-CM | POA: Diagnosis not present

## 2014-11-09 DIAGNOSIS — M6281 Muscle weakness (generalized): Secondary | ICD-10-CM | POA: Diagnosis not present

## 2014-11-09 DIAGNOSIS — R309 Painful micturition, unspecified: Secondary | ICD-10-CM | POA: Diagnosis not present

## 2014-11-09 DIAGNOSIS — E785 Hyperlipidemia, unspecified: Secondary | ICD-10-CM | POA: Diagnosis not present

## 2014-11-09 DIAGNOSIS — Z8673 Personal history of transient ischemic attack (TIA), and cerebral infarction without residual deficits: Secondary | ICD-10-CM | POA: Diagnosis not present

## 2014-11-09 DIAGNOSIS — E039 Hypothyroidism, unspecified: Secondary | ICD-10-CM | POA: Diagnosis not present

## 2014-11-09 DIAGNOSIS — E559 Vitamin D deficiency, unspecified: Secondary | ICD-10-CM | POA: Diagnosis not present

## 2014-11-09 DIAGNOSIS — Z7901 Long term (current) use of anticoagulants: Secondary | ICD-10-CM | POA: Diagnosis not present

## 2014-11-09 DIAGNOSIS — B3783 Candidal cheilitis: Secondary | ICD-10-CM | POA: Diagnosis not present

## 2014-11-09 DIAGNOSIS — R41841 Cognitive communication deficit: Secondary | ICD-10-CM | POA: Diagnosis not present

## 2014-11-10 DIAGNOSIS — R41841 Cognitive communication deficit: Secondary | ICD-10-CM | POA: Diagnosis not present

## 2014-11-10 DIAGNOSIS — I509 Heart failure, unspecified: Secondary | ICD-10-CM | POA: Diagnosis not present

## 2014-11-10 DIAGNOSIS — M6281 Muscle weakness (generalized): Secondary | ICD-10-CM | POA: Diagnosis not present

## 2014-11-10 DIAGNOSIS — R293 Abnormal posture: Secondary | ICD-10-CM | POA: Diagnosis not present

## 2014-11-10 DIAGNOSIS — F329 Major depressive disorder, single episode, unspecified: Secondary | ICD-10-CM | POA: Diagnosis not present

## 2014-11-11 DIAGNOSIS — I509 Heart failure, unspecified: Secondary | ICD-10-CM | POA: Diagnosis not present

## 2014-11-11 DIAGNOSIS — R293 Abnormal posture: Secondary | ICD-10-CM | POA: Diagnosis not present

## 2014-11-11 DIAGNOSIS — F329 Major depressive disorder, single episode, unspecified: Secondary | ICD-10-CM | POA: Diagnosis not present

## 2014-11-11 DIAGNOSIS — R41841 Cognitive communication deficit: Secondary | ICD-10-CM | POA: Diagnosis not present

## 2014-11-11 DIAGNOSIS — M6281 Muscle weakness (generalized): Secondary | ICD-10-CM | POA: Diagnosis not present

## 2014-11-12 DIAGNOSIS — R309 Painful micturition, unspecified: Secondary | ICD-10-CM | POA: Diagnosis not present

## 2014-11-12 DIAGNOSIS — Z7901 Long term (current) use of anticoagulants: Secondary | ICD-10-CM | POA: Diagnosis not present

## 2014-11-12 DIAGNOSIS — D689 Coagulation defect, unspecified: Secondary | ICD-10-CM | POA: Diagnosis not present

## 2014-11-12 DIAGNOSIS — N390 Urinary tract infection, site not specified: Secondary | ICD-10-CM | POA: Diagnosis not present

## 2014-11-12 DIAGNOSIS — R300 Dysuria: Secondary | ICD-10-CM | POA: Diagnosis not present

## 2014-11-12 DIAGNOSIS — B3783 Candidal cheilitis: Secondary | ICD-10-CM | POA: Diagnosis not present

## 2014-11-12 DIAGNOSIS — E785 Hyperlipidemia, unspecified: Secondary | ICD-10-CM | POA: Diagnosis not present

## 2014-11-12 DIAGNOSIS — R319 Hematuria, unspecified: Secondary | ICD-10-CM | POA: Diagnosis not present

## 2014-11-12 DIAGNOSIS — Z8673 Personal history of transient ischemic attack (TIA), and cerebral infarction without residual deficits: Secondary | ICD-10-CM | POA: Diagnosis not present

## 2014-11-14 DIAGNOSIS — R41841 Cognitive communication deficit: Secondary | ICD-10-CM | POA: Diagnosis not present

## 2014-11-14 DIAGNOSIS — I69954 Hemiplegia and hemiparesis following unspecified cerebrovascular disease affecting left non-dominant side: Secondary | ICD-10-CM | POA: Diagnosis not present

## 2014-11-14 DIAGNOSIS — F329 Major depressive disorder, single episode, unspecified: Secondary | ICD-10-CM | POA: Diagnosis not present

## 2014-11-14 DIAGNOSIS — M6281 Muscle weakness (generalized): Secondary | ICD-10-CM | POA: Diagnosis not present

## 2014-11-14 DIAGNOSIS — I509 Heart failure, unspecified: Secondary | ICD-10-CM | POA: Diagnosis not present

## 2014-11-15 DIAGNOSIS — I69954 Hemiplegia and hemiparesis following unspecified cerebrovascular disease affecting left non-dominant side: Secondary | ICD-10-CM | POA: Diagnosis not present

## 2014-11-15 DIAGNOSIS — I509 Heart failure, unspecified: Secondary | ICD-10-CM | POA: Diagnosis not present

## 2014-11-15 DIAGNOSIS — M6281 Muscle weakness (generalized): Secondary | ICD-10-CM | POA: Diagnosis not present

## 2014-11-15 DIAGNOSIS — R41841 Cognitive communication deficit: Secondary | ICD-10-CM | POA: Diagnosis not present

## 2014-11-15 DIAGNOSIS — I5030 Unspecified diastolic (congestive) heart failure: Secondary | ICD-10-CM | POA: Diagnosis not present

## 2014-11-15 DIAGNOSIS — F329 Major depressive disorder, single episode, unspecified: Secondary | ICD-10-CM | POA: Diagnosis not present

## 2014-11-15 DIAGNOSIS — I2510 Atherosclerotic heart disease of native coronary artery without angina pectoris: Secondary | ICD-10-CM | POA: Diagnosis not present

## 2014-11-15 DIAGNOSIS — Z7901 Long term (current) use of anticoagulants: Secondary | ICD-10-CM | POA: Diagnosis not present

## 2014-11-16 DIAGNOSIS — B009 Herpesviral infection, unspecified: Secondary | ICD-10-CM | POA: Diagnosis not present

## 2014-11-16 DIAGNOSIS — R509 Fever, unspecified: Secondary | ICD-10-CM | POA: Diagnosis not present

## 2014-11-16 DIAGNOSIS — Z8673 Personal history of transient ischemic attack (TIA), and cerebral infarction without residual deficits: Secondary | ICD-10-CM | POA: Diagnosis not present

## 2014-11-16 DIAGNOSIS — D649 Anemia, unspecified: Secondary | ICD-10-CM | POA: Diagnosis not present

## 2014-11-16 DIAGNOSIS — R41841 Cognitive communication deficit: Secondary | ICD-10-CM | POA: Diagnosis not present

## 2014-11-16 DIAGNOSIS — I69954 Hemiplegia and hemiparesis following unspecified cerebrovascular disease affecting left non-dominant side: Secondary | ICD-10-CM | POA: Diagnosis not present

## 2014-11-16 DIAGNOSIS — N390 Urinary tract infection, site not specified: Secondary | ICD-10-CM | POA: Diagnosis not present

## 2014-11-16 DIAGNOSIS — I509 Heart failure, unspecified: Secondary | ICD-10-CM | POA: Diagnosis not present

## 2014-11-16 DIAGNOSIS — Z79899 Other long term (current) drug therapy: Secondary | ICD-10-CM | POA: Diagnosis not present

## 2014-11-16 DIAGNOSIS — F329 Major depressive disorder, single episode, unspecified: Secondary | ICD-10-CM | POA: Diagnosis not present

## 2014-11-16 DIAGNOSIS — K120 Recurrent oral aphthae: Secondary | ICD-10-CM | POA: Diagnosis not present

## 2014-11-16 DIAGNOSIS — M6281 Muscle weakness (generalized): Secondary | ICD-10-CM | POA: Diagnosis not present

## 2014-11-16 DIAGNOSIS — D689 Coagulation defect, unspecified: Secondary | ICD-10-CM | POA: Diagnosis not present

## 2014-11-17 DIAGNOSIS — R41841 Cognitive communication deficit: Secondary | ICD-10-CM | POA: Diagnosis not present

## 2014-11-17 DIAGNOSIS — I509 Heart failure, unspecified: Secondary | ICD-10-CM | POA: Diagnosis not present

## 2014-11-17 DIAGNOSIS — R509 Fever, unspecified: Secondary | ICD-10-CM | POA: Diagnosis not present

## 2014-11-17 DIAGNOSIS — I69954 Hemiplegia and hemiparesis following unspecified cerebrovascular disease affecting left non-dominant side: Secondary | ICD-10-CM | POA: Diagnosis not present

## 2014-11-17 DIAGNOSIS — D689 Coagulation defect, unspecified: Secondary | ICD-10-CM | POA: Diagnosis not present

## 2014-11-17 DIAGNOSIS — Z8673 Personal history of transient ischemic attack (TIA), and cerebral infarction without residual deficits: Secondary | ICD-10-CM | POA: Diagnosis not present

## 2014-11-17 DIAGNOSIS — F329 Major depressive disorder, single episode, unspecified: Secondary | ICD-10-CM | POA: Diagnosis not present

## 2014-11-17 DIAGNOSIS — M6281 Muscle weakness (generalized): Secondary | ICD-10-CM | POA: Diagnosis not present

## 2014-11-17 DIAGNOSIS — K120 Recurrent oral aphthae: Secondary | ICD-10-CM | POA: Diagnosis not present

## 2014-11-18 DIAGNOSIS — R509 Fever, unspecified: Secondary | ICD-10-CM | POA: Diagnosis not present

## 2014-11-18 DIAGNOSIS — Z8673 Personal history of transient ischemic attack (TIA), and cerebral infarction without residual deficits: Secondary | ICD-10-CM | POA: Diagnosis not present

## 2014-11-18 DIAGNOSIS — I509 Heart failure, unspecified: Secondary | ICD-10-CM | POA: Diagnosis not present

## 2014-11-18 DIAGNOSIS — F329 Major depressive disorder, single episode, unspecified: Secondary | ICD-10-CM | POA: Diagnosis not present

## 2014-11-18 DIAGNOSIS — D689 Coagulation defect, unspecified: Secondary | ICD-10-CM | POA: Diagnosis not present

## 2014-11-18 DIAGNOSIS — M6281 Muscle weakness (generalized): Secondary | ICD-10-CM | POA: Diagnosis not present

## 2014-11-18 DIAGNOSIS — Z7901 Long term (current) use of anticoagulants: Secondary | ICD-10-CM | POA: Diagnosis not present

## 2014-11-18 DIAGNOSIS — I69954 Hemiplegia and hemiparesis following unspecified cerebrovascular disease affecting left non-dominant side: Secondary | ICD-10-CM | POA: Diagnosis not present

## 2014-11-18 DIAGNOSIS — R41841 Cognitive communication deficit: Secondary | ICD-10-CM | POA: Diagnosis not present

## 2014-11-18 DIAGNOSIS — K120 Recurrent oral aphthae: Secondary | ICD-10-CM | POA: Diagnosis not present

## 2014-11-21 DIAGNOSIS — M6281 Muscle weakness (generalized): Secondary | ICD-10-CM | POA: Diagnosis not present

## 2014-11-21 DIAGNOSIS — K120 Recurrent oral aphthae: Secondary | ICD-10-CM | POA: Diagnosis not present

## 2014-11-21 DIAGNOSIS — I509 Heart failure, unspecified: Secondary | ICD-10-CM | POA: Diagnosis not present

## 2014-11-21 DIAGNOSIS — R41841 Cognitive communication deficit: Secondary | ICD-10-CM | POA: Diagnosis not present

## 2014-11-21 DIAGNOSIS — Z7901 Long term (current) use of anticoagulants: Secondary | ICD-10-CM | POA: Diagnosis not present

## 2014-11-21 DIAGNOSIS — F329 Major depressive disorder, single episode, unspecified: Secondary | ICD-10-CM | POA: Diagnosis not present

## 2014-11-21 DIAGNOSIS — I69954 Hemiplegia and hemiparesis following unspecified cerebrovascular disease affecting left non-dominant side: Secondary | ICD-10-CM | POA: Diagnosis not present

## 2014-11-22 DIAGNOSIS — I509 Heart failure, unspecified: Secondary | ICD-10-CM | POA: Diagnosis not present

## 2014-11-22 DIAGNOSIS — M6281 Muscle weakness (generalized): Secondary | ICD-10-CM | POA: Diagnosis not present

## 2014-11-22 DIAGNOSIS — F332 Major depressive disorder, recurrent severe without psychotic features: Secondary | ICD-10-CM | POA: Diagnosis not present

## 2014-11-22 DIAGNOSIS — I69954 Hemiplegia and hemiparesis following unspecified cerebrovascular disease affecting left non-dominant side: Secondary | ICD-10-CM | POA: Diagnosis not present

## 2014-11-22 DIAGNOSIS — R41841 Cognitive communication deficit: Secondary | ICD-10-CM | POA: Diagnosis not present

## 2014-11-22 DIAGNOSIS — F329 Major depressive disorder, single episode, unspecified: Secondary | ICD-10-CM | POA: Diagnosis not present

## 2014-11-22 DIAGNOSIS — F411 Generalized anxiety disorder: Secondary | ICD-10-CM | POA: Diagnosis not present

## 2014-11-23 DIAGNOSIS — R41841 Cognitive communication deficit: Secondary | ICD-10-CM | POA: Diagnosis not present

## 2014-11-23 DIAGNOSIS — I509 Heart failure, unspecified: Secondary | ICD-10-CM | POA: Diagnosis not present

## 2014-11-23 DIAGNOSIS — M6281 Muscle weakness (generalized): Secondary | ICD-10-CM | POA: Diagnosis not present

## 2014-11-23 DIAGNOSIS — F329 Major depressive disorder, single episode, unspecified: Secondary | ICD-10-CM | POA: Diagnosis not present

## 2014-11-23 DIAGNOSIS — I69954 Hemiplegia and hemiparesis following unspecified cerebrovascular disease affecting left non-dominant side: Secondary | ICD-10-CM | POA: Diagnosis not present

## 2014-11-24 DIAGNOSIS — Z8673 Personal history of transient ischemic attack (TIA), and cerebral infarction without residual deficits: Secondary | ICD-10-CM | POA: Diagnosis not present

## 2014-11-24 DIAGNOSIS — I509 Heart failure, unspecified: Secondary | ICD-10-CM | POA: Diagnosis not present

## 2014-11-24 DIAGNOSIS — K120 Recurrent oral aphthae: Secondary | ICD-10-CM | POA: Diagnosis not present

## 2014-11-24 DIAGNOSIS — M6281 Muscle weakness (generalized): Secondary | ICD-10-CM | POA: Diagnosis not present

## 2014-11-24 DIAGNOSIS — Z7901 Long term (current) use of anticoagulants: Secondary | ICD-10-CM | POA: Diagnosis not present

## 2014-11-24 DIAGNOSIS — R41841 Cognitive communication deficit: Secondary | ICD-10-CM | POA: Diagnosis not present

## 2014-11-24 DIAGNOSIS — I69954 Hemiplegia and hemiparesis following unspecified cerebrovascular disease affecting left non-dominant side: Secondary | ICD-10-CM | POA: Diagnosis not present

## 2014-11-24 DIAGNOSIS — F329 Major depressive disorder, single episode, unspecified: Secondary | ICD-10-CM | POA: Diagnosis not present

## 2014-11-24 DIAGNOSIS — D689 Coagulation defect, unspecified: Secondary | ICD-10-CM | POA: Diagnosis not present

## 2014-11-25 DIAGNOSIS — F329 Major depressive disorder, single episode, unspecified: Secondary | ICD-10-CM | POA: Diagnosis not present

## 2014-11-25 DIAGNOSIS — M6281 Muscle weakness (generalized): Secondary | ICD-10-CM | POA: Diagnosis not present

## 2014-11-25 DIAGNOSIS — I509 Heart failure, unspecified: Secondary | ICD-10-CM | POA: Diagnosis not present

## 2014-11-25 DIAGNOSIS — I69954 Hemiplegia and hemiparesis following unspecified cerebrovascular disease affecting left non-dominant side: Secondary | ICD-10-CM | POA: Diagnosis not present

## 2014-11-25 DIAGNOSIS — R41841 Cognitive communication deficit: Secondary | ICD-10-CM | POA: Diagnosis not present

## 2014-11-27 DIAGNOSIS — M6281 Muscle weakness (generalized): Secondary | ICD-10-CM | POA: Diagnosis not present

## 2014-11-27 DIAGNOSIS — R41841 Cognitive communication deficit: Secondary | ICD-10-CM | POA: Diagnosis not present

## 2014-11-27 DIAGNOSIS — I69954 Hemiplegia and hemiparesis following unspecified cerebrovascular disease affecting left non-dominant side: Secondary | ICD-10-CM | POA: Diagnosis not present

## 2014-11-27 DIAGNOSIS — F329 Major depressive disorder, single episode, unspecified: Secondary | ICD-10-CM | POA: Diagnosis not present

## 2014-11-27 DIAGNOSIS — I509 Heart failure, unspecified: Secondary | ICD-10-CM | POA: Diagnosis not present

## 2014-11-28 DIAGNOSIS — Z8673 Personal history of transient ischemic attack (TIA), and cerebral infarction without residual deficits: Secondary | ICD-10-CM | POA: Diagnosis not present

## 2014-11-28 DIAGNOSIS — K120 Recurrent oral aphthae: Secondary | ICD-10-CM | POA: Diagnosis not present

## 2014-11-28 DIAGNOSIS — Z7901 Long term (current) use of anticoagulants: Secondary | ICD-10-CM | POA: Diagnosis not present

## 2014-11-28 DIAGNOSIS — D689 Coagulation defect, unspecified: Secondary | ICD-10-CM | POA: Diagnosis not present

## 2014-11-29 DIAGNOSIS — M6281 Muscle weakness (generalized): Secondary | ICD-10-CM | POA: Diagnosis not present

## 2014-11-29 DIAGNOSIS — R41841 Cognitive communication deficit: Secondary | ICD-10-CM | POA: Diagnosis not present

## 2014-11-29 DIAGNOSIS — F332 Major depressive disorder, recurrent severe without psychotic features: Secondary | ICD-10-CM | POA: Diagnosis not present

## 2014-11-29 DIAGNOSIS — I509 Heart failure, unspecified: Secondary | ICD-10-CM | POA: Diagnosis not present

## 2014-11-29 DIAGNOSIS — F329 Major depressive disorder, single episode, unspecified: Secondary | ICD-10-CM | POA: Diagnosis not present

## 2014-11-29 DIAGNOSIS — I69954 Hemiplegia and hemiparesis following unspecified cerebrovascular disease affecting left non-dominant side: Secondary | ICD-10-CM | POA: Diagnosis not present

## 2014-11-29 DIAGNOSIS — F411 Generalized anxiety disorder: Secondary | ICD-10-CM | POA: Diagnosis not present

## 2014-11-30 DIAGNOSIS — M6281 Muscle weakness (generalized): Secondary | ICD-10-CM | POA: Diagnosis not present

## 2014-11-30 DIAGNOSIS — I69954 Hemiplegia and hemiparesis following unspecified cerebrovascular disease affecting left non-dominant side: Secondary | ICD-10-CM | POA: Diagnosis not present

## 2014-11-30 DIAGNOSIS — R41841 Cognitive communication deficit: Secondary | ICD-10-CM | POA: Diagnosis not present

## 2014-11-30 DIAGNOSIS — F329 Major depressive disorder, single episode, unspecified: Secondary | ICD-10-CM | POA: Diagnosis not present

## 2014-11-30 DIAGNOSIS — I509 Heart failure, unspecified: Secondary | ICD-10-CM | POA: Diagnosis not present

## 2014-12-01 DIAGNOSIS — I509 Heart failure, unspecified: Secondary | ICD-10-CM | POA: Diagnosis not present

## 2014-12-01 DIAGNOSIS — Z7901 Long term (current) use of anticoagulants: Secondary | ICD-10-CM | POA: Diagnosis not present

## 2014-12-01 DIAGNOSIS — Z8673 Personal history of transient ischemic attack (TIA), and cerebral infarction without residual deficits: Secondary | ICD-10-CM | POA: Diagnosis not present

## 2014-12-01 DIAGNOSIS — R41841 Cognitive communication deficit: Secondary | ICD-10-CM | POA: Diagnosis not present

## 2014-12-01 DIAGNOSIS — D689 Coagulation defect, unspecified: Secondary | ICD-10-CM | POA: Diagnosis not present

## 2014-12-01 DIAGNOSIS — I69954 Hemiplegia and hemiparesis following unspecified cerebrovascular disease affecting left non-dominant side: Secondary | ICD-10-CM | POA: Diagnosis not present

## 2014-12-01 DIAGNOSIS — F329 Major depressive disorder, single episode, unspecified: Secondary | ICD-10-CM | POA: Diagnosis not present

## 2014-12-01 DIAGNOSIS — I5030 Unspecified diastolic (congestive) heart failure: Secondary | ICD-10-CM | POA: Diagnosis not present

## 2014-12-01 DIAGNOSIS — M6281 Muscle weakness (generalized): Secondary | ICD-10-CM | POA: Diagnosis not present

## 2014-12-02 DIAGNOSIS — M6281 Muscle weakness (generalized): Secondary | ICD-10-CM | POA: Diagnosis not present

## 2014-12-02 DIAGNOSIS — I69954 Hemiplegia and hemiparesis following unspecified cerebrovascular disease affecting left non-dominant side: Secondary | ICD-10-CM | POA: Diagnosis not present

## 2014-12-02 DIAGNOSIS — I509 Heart failure, unspecified: Secondary | ICD-10-CM | POA: Diagnosis not present

## 2014-12-02 DIAGNOSIS — R41841 Cognitive communication deficit: Secondary | ICD-10-CM | POA: Diagnosis not present

## 2014-12-02 DIAGNOSIS — K120 Recurrent oral aphthae: Secondary | ICD-10-CM | POA: Diagnosis not present

## 2014-12-02 DIAGNOSIS — F329 Major depressive disorder, single episode, unspecified: Secondary | ICD-10-CM | POA: Diagnosis not present

## 2014-12-05 DIAGNOSIS — I69954 Hemiplegia and hemiparesis following unspecified cerebrovascular disease affecting left non-dominant side: Secondary | ICD-10-CM | POA: Diagnosis not present

## 2014-12-05 DIAGNOSIS — H2513 Age-related nuclear cataract, bilateral: Secondary | ICD-10-CM | POA: Diagnosis not present

## 2014-12-05 DIAGNOSIS — F329 Major depressive disorder, single episode, unspecified: Secondary | ICD-10-CM | POA: Diagnosis not present

## 2014-12-05 DIAGNOSIS — I509 Heart failure, unspecified: Secondary | ICD-10-CM | POA: Diagnosis not present

## 2014-12-05 DIAGNOSIS — M6281 Muscle weakness (generalized): Secondary | ICD-10-CM | POA: Diagnosis not present

## 2014-12-05 DIAGNOSIS — H04123 Dry eye syndrome of bilateral lacrimal glands: Secondary | ICD-10-CM | POA: Diagnosis not present

## 2014-12-05 DIAGNOSIS — H4011X4 Primary open-angle glaucoma, indeterminate stage: Secondary | ICD-10-CM | POA: Diagnosis not present

## 2014-12-05 DIAGNOSIS — R41841 Cognitive communication deficit: Secondary | ICD-10-CM | POA: Diagnosis not present

## 2014-12-06 DIAGNOSIS — R41841 Cognitive communication deficit: Secondary | ICD-10-CM | POA: Diagnosis not present

## 2014-12-06 DIAGNOSIS — M6281 Muscle weakness (generalized): Secondary | ICD-10-CM | POA: Diagnosis not present

## 2014-12-06 DIAGNOSIS — F329 Major depressive disorder, single episode, unspecified: Secondary | ICD-10-CM | POA: Diagnosis not present

## 2014-12-06 DIAGNOSIS — I509 Heart failure, unspecified: Secondary | ICD-10-CM | POA: Diagnosis not present

## 2014-12-06 DIAGNOSIS — I69954 Hemiplegia and hemiparesis following unspecified cerebrovascular disease affecting left non-dominant side: Secondary | ICD-10-CM | POA: Diagnosis not present

## 2014-12-07 DIAGNOSIS — R41841 Cognitive communication deficit: Secondary | ICD-10-CM | POA: Diagnosis not present

## 2014-12-07 DIAGNOSIS — I69954 Hemiplegia and hemiparesis following unspecified cerebrovascular disease affecting left non-dominant side: Secondary | ICD-10-CM | POA: Diagnosis not present

## 2014-12-07 DIAGNOSIS — F329 Major depressive disorder, single episode, unspecified: Secondary | ICD-10-CM | POA: Diagnosis not present

## 2014-12-07 DIAGNOSIS — I509 Heart failure, unspecified: Secondary | ICD-10-CM | POA: Diagnosis not present

## 2014-12-07 DIAGNOSIS — M6281 Muscle weakness (generalized): Secondary | ICD-10-CM | POA: Diagnosis not present

## 2014-12-08 DIAGNOSIS — I509 Heart failure, unspecified: Secondary | ICD-10-CM | POA: Diagnosis not present

## 2014-12-08 DIAGNOSIS — I69954 Hemiplegia and hemiparesis following unspecified cerebrovascular disease affecting left non-dominant side: Secondary | ICD-10-CM | POA: Diagnosis not present

## 2014-12-08 DIAGNOSIS — Z7901 Long term (current) use of anticoagulants: Secondary | ICD-10-CM | POA: Diagnosis not present

## 2014-12-08 DIAGNOSIS — D689 Coagulation defect, unspecified: Secondary | ICD-10-CM | POA: Diagnosis not present

## 2014-12-08 DIAGNOSIS — M6281 Muscle weakness (generalized): Secondary | ICD-10-CM | POA: Diagnosis not present

## 2014-12-08 DIAGNOSIS — F329 Major depressive disorder, single episode, unspecified: Secondary | ICD-10-CM | POA: Diagnosis not present

## 2014-12-08 DIAGNOSIS — Z8673 Personal history of transient ischemic attack (TIA), and cerebral infarction without residual deficits: Secondary | ICD-10-CM | POA: Diagnosis not present

## 2014-12-08 DIAGNOSIS — R41841 Cognitive communication deficit: Secondary | ICD-10-CM | POA: Diagnosis not present

## 2014-12-09 DIAGNOSIS — I69954 Hemiplegia and hemiparesis following unspecified cerebrovascular disease affecting left non-dominant side: Secondary | ICD-10-CM | POA: Diagnosis not present

## 2014-12-09 DIAGNOSIS — F332 Major depressive disorder, recurrent severe without psychotic features: Secondary | ICD-10-CM | POA: Diagnosis not present

## 2014-12-09 DIAGNOSIS — I509 Heart failure, unspecified: Secondary | ICD-10-CM | POA: Diagnosis not present

## 2014-12-09 DIAGNOSIS — M6281 Muscle weakness (generalized): Secondary | ICD-10-CM | POA: Diagnosis not present

## 2014-12-09 DIAGNOSIS — R41841 Cognitive communication deficit: Secondary | ICD-10-CM | POA: Diagnosis not present

## 2014-12-09 DIAGNOSIS — F329 Major depressive disorder, single episode, unspecified: Secondary | ICD-10-CM | POA: Diagnosis not present

## 2014-12-09 DIAGNOSIS — F411 Generalized anxiety disorder: Secondary | ICD-10-CM | POA: Diagnosis not present

## 2014-12-12 DIAGNOSIS — F329 Major depressive disorder, single episode, unspecified: Secondary | ICD-10-CM | POA: Diagnosis not present

## 2014-12-12 DIAGNOSIS — M6281 Muscle weakness (generalized): Secondary | ICD-10-CM | POA: Diagnosis not present

## 2014-12-12 DIAGNOSIS — I509 Heart failure, unspecified: Secondary | ICD-10-CM | POA: Diagnosis not present

## 2014-12-12 DIAGNOSIS — R41841 Cognitive communication deficit: Secondary | ICD-10-CM | POA: Diagnosis not present

## 2014-12-12 DIAGNOSIS — I69954 Hemiplegia and hemiparesis following unspecified cerebrovascular disease affecting left non-dominant side: Secondary | ICD-10-CM | POA: Diagnosis not present

## 2014-12-13 DIAGNOSIS — Z8673 Personal history of transient ischemic attack (TIA), and cerebral infarction without residual deficits: Secondary | ICD-10-CM | POA: Diagnosis not present

## 2014-12-13 DIAGNOSIS — D689 Coagulation defect, unspecified: Secondary | ICD-10-CM | POA: Diagnosis not present

## 2014-12-13 DIAGNOSIS — Z7901 Long term (current) use of anticoagulants: Secondary | ICD-10-CM | POA: Diagnosis not present

## 2014-12-16 DIAGNOSIS — Z7901 Long term (current) use of anticoagulants: Secondary | ICD-10-CM | POA: Diagnosis not present

## 2014-12-16 DIAGNOSIS — D689 Coagulation defect, unspecified: Secondary | ICD-10-CM | POA: Diagnosis not present

## 2014-12-16 DIAGNOSIS — F411 Generalized anxiety disorder: Secondary | ICD-10-CM | POA: Diagnosis not present

## 2014-12-16 DIAGNOSIS — I10 Essential (primary) hypertension: Secondary | ICD-10-CM | POA: Diagnosis not present

## 2014-12-16 DIAGNOSIS — Z8673 Personal history of transient ischemic attack (TIA), and cerebral infarction without residual deficits: Secondary | ICD-10-CM | POA: Diagnosis not present

## 2014-12-16 DIAGNOSIS — K120 Recurrent oral aphthae: Secondary | ICD-10-CM | POA: Diagnosis not present

## 2014-12-16 DIAGNOSIS — F332 Major depressive disorder, recurrent severe without psychotic features: Secondary | ICD-10-CM | POA: Diagnosis not present

## 2014-12-19 DIAGNOSIS — D689 Coagulation defect, unspecified: Secondary | ICD-10-CM | POA: Diagnosis not present

## 2014-12-19 DIAGNOSIS — E039 Hypothyroidism, unspecified: Secondary | ICD-10-CM | POA: Diagnosis not present

## 2014-12-19 DIAGNOSIS — Z7901 Long term (current) use of anticoagulants: Secondary | ICD-10-CM | POA: Diagnosis not present

## 2014-12-19 DIAGNOSIS — Z8673 Personal history of transient ischemic attack (TIA), and cerebral infarction without residual deficits: Secondary | ICD-10-CM | POA: Diagnosis not present

## 2014-12-19 DIAGNOSIS — I10 Essential (primary) hypertension: Secondary | ICD-10-CM | POA: Diagnosis not present

## 2014-12-21 DIAGNOSIS — I10 Essential (primary) hypertension: Secondary | ICD-10-CM | POA: Diagnosis not present

## 2014-12-21 DIAGNOSIS — E039 Hypothyroidism, unspecified: Secondary | ICD-10-CM | POA: Diagnosis not present

## 2014-12-21 DIAGNOSIS — Z8673 Personal history of transient ischemic attack (TIA), and cerebral infarction without residual deficits: Secondary | ICD-10-CM | POA: Diagnosis not present

## 2014-12-21 DIAGNOSIS — D689 Coagulation defect, unspecified: Secondary | ICD-10-CM | POA: Diagnosis not present

## 2014-12-23 DIAGNOSIS — F332 Major depressive disorder, recurrent severe without psychotic features: Secondary | ICD-10-CM | POA: Diagnosis not present

## 2014-12-23 DIAGNOSIS — F411 Generalized anxiety disorder: Secondary | ICD-10-CM | POA: Diagnosis not present

## 2014-12-26 DIAGNOSIS — Z8673 Personal history of transient ischemic attack (TIA), and cerebral infarction without residual deficits: Secondary | ICD-10-CM | POA: Diagnosis not present

## 2014-12-26 DIAGNOSIS — D689 Coagulation defect, unspecified: Secondary | ICD-10-CM | POA: Diagnosis not present

## 2014-12-26 DIAGNOSIS — Z7901 Long term (current) use of anticoagulants: Secondary | ICD-10-CM | POA: Diagnosis not present

## 2014-12-27 DIAGNOSIS — F332 Major depressive disorder, recurrent severe without psychotic features: Secondary | ICD-10-CM | POA: Diagnosis not present

## 2014-12-27 DIAGNOSIS — F411 Generalized anxiety disorder: Secondary | ICD-10-CM | POA: Diagnosis not present

## 2014-12-29 DIAGNOSIS — M79675 Pain in left toe(s): Secondary | ICD-10-CM | POA: Diagnosis not present

## 2014-12-29 DIAGNOSIS — Z8673 Personal history of transient ischemic attack (TIA), and cerebral infarction without residual deficits: Secondary | ICD-10-CM | POA: Diagnosis not present

## 2014-12-29 DIAGNOSIS — M79674 Pain in right toe(s): Secondary | ICD-10-CM | POA: Diagnosis not present

## 2014-12-29 DIAGNOSIS — I10 Essential (primary) hypertension: Secondary | ICD-10-CM | POA: Diagnosis not present

## 2014-12-29 DIAGNOSIS — B351 Tinea unguium: Secondary | ICD-10-CM | POA: Diagnosis not present

## 2014-12-29 DIAGNOSIS — D649 Anemia, unspecified: Secondary | ICD-10-CM | POA: Diagnosis not present

## 2014-12-29 DIAGNOSIS — K120 Recurrent oral aphthae: Secondary | ICD-10-CM | POA: Diagnosis not present

## 2014-12-29 DIAGNOSIS — Z79899 Other long term (current) drug therapy: Secondary | ICD-10-CM | POA: Diagnosis not present

## 2014-12-29 DIAGNOSIS — I5030 Unspecified diastolic (congestive) heart failure: Secondary | ICD-10-CM | POA: Diagnosis not present

## 2014-12-30 DIAGNOSIS — I509 Heart failure, unspecified: Secondary | ICD-10-CM | POA: Diagnosis not present

## 2014-12-30 DIAGNOSIS — K120 Recurrent oral aphthae: Secondary | ICD-10-CM | POA: Diagnosis not present

## 2014-12-30 DIAGNOSIS — Z8673 Personal history of transient ischemic attack (TIA), and cerebral infarction without residual deficits: Secondary | ICD-10-CM | POA: Diagnosis not present

## 2014-12-30 DIAGNOSIS — I5030 Unspecified diastolic (congestive) heart failure: Secondary | ICD-10-CM | POA: Diagnosis not present

## 2014-12-30 DIAGNOSIS — I10 Essential (primary) hypertension: Secondary | ICD-10-CM | POA: Diagnosis not present

## 2014-12-30 DIAGNOSIS — Z7901 Long term (current) use of anticoagulants: Secondary | ICD-10-CM | POA: Diagnosis not present

## 2014-12-30 DIAGNOSIS — D689 Coagulation defect, unspecified: Secondary | ICD-10-CM | POA: Diagnosis not present

## 2015-01-03 DIAGNOSIS — Z7901 Long term (current) use of anticoagulants: Secondary | ICD-10-CM | POA: Diagnosis not present

## 2015-01-03 DIAGNOSIS — Z8673 Personal history of transient ischemic attack (TIA), and cerebral infarction without residual deficits: Secondary | ICD-10-CM | POA: Diagnosis not present

## 2015-01-03 DIAGNOSIS — D689 Coagulation defect, unspecified: Secondary | ICD-10-CM | POA: Diagnosis not present

## 2015-01-03 DIAGNOSIS — K120 Recurrent oral aphthae: Secondary | ICD-10-CM | POA: Diagnosis not present

## 2015-01-05 DIAGNOSIS — R05 Cough: Secondary | ICD-10-CM | POA: Diagnosis not present

## 2015-01-05 DIAGNOSIS — K120 Recurrent oral aphthae: Secondary | ICD-10-CM | POA: Diagnosis not present

## 2015-01-05 DIAGNOSIS — D689 Coagulation defect, unspecified: Secondary | ICD-10-CM | POA: Diagnosis not present

## 2015-01-05 DIAGNOSIS — Z8673 Personal history of transient ischemic attack (TIA), and cerebral infarction without residual deficits: Secondary | ICD-10-CM | POA: Diagnosis not present

## 2015-01-06 DIAGNOSIS — F411 Generalized anxiety disorder: Secondary | ICD-10-CM | POA: Diagnosis not present

## 2015-01-06 DIAGNOSIS — F332 Major depressive disorder, recurrent severe without psychotic features: Secondary | ICD-10-CM | POA: Diagnosis not present

## 2015-01-09 DIAGNOSIS — K120 Recurrent oral aphthae: Secondary | ICD-10-CM | POA: Diagnosis not present

## 2015-01-09 DIAGNOSIS — D689 Coagulation defect, unspecified: Secondary | ICD-10-CM | POA: Diagnosis not present

## 2015-01-09 DIAGNOSIS — Z7901 Long term (current) use of anticoagulants: Secondary | ICD-10-CM | POA: Diagnosis not present

## 2015-01-09 DIAGNOSIS — R05 Cough: Secondary | ICD-10-CM | POA: Diagnosis not present

## 2015-01-09 DIAGNOSIS — Z8673 Personal history of transient ischemic attack (TIA), and cerebral infarction without residual deficits: Secondary | ICD-10-CM | POA: Diagnosis not present

## 2015-01-10 DIAGNOSIS — F332 Major depressive disorder, recurrent severe without psychotic features: Secondary | ICD-10-CM | POA: Diagnosis not present

## 2015-01-10 DIAGNOSIS — F411 Generalized anxiety disorder: Secondary | ICD-10-CM | POA: Diagnosis not present

## 2015-01-12 DIAGNOSIS — Z7901 Long term (current) use of anticoagulants: Secondary | ICD-10-CM | POA: Diagnosis not present

## 2015-01-12 DIAGNOSIS — I509 Heart failure, unspecified: Secondary | ICD-10-CM | POA: Diagnosis not present

## 2015-01-12 DIAGNOSIS — Z8673 Personal history of transient ischemic attack (TIA), and cerebral infarction without residual deficits: Secondary | ICD-10-CM | POA: Diagnosis not present

## 2015-01-12 DIAGNOSIS — R05 Cough: Secondary | ICD-10-CM | POA: Diagnosis not present

## 2015-01-12 DIAGNOSIS — D689 Coagulation defect, unspecified: Secondary | ICD-10-CM | POA: Diagnosis not present

## 2015-01-12 DIAGNOSIS — I5030 Unspecified diastolic (congestive) heart failure: Secondary | ICD-10-CM | POA: Diagnosis not present

## 2015-01-12 DIAGNOSIS — J449 Chronic obstructive pulmonary disease, unspecified: Secondary | ICD-10-CM | POA: Diagnosis not present

## 2015-01-17 DIAGNOSIS — F332 Major depressive disorder, recurrent severe without psychotic features: Secondary | ICD-10-CM | POA: Diagnosis not present

## 2015-01-17 DIAGNOSIS — F411 Generalized anxiety disorder: Secondary | ICD-10-CM | POA: Diagnosis not present

## 2015-01-19 DIAGNOSIS — Z7901 Long term (current) use of anticoagulants: Secondary | ICD-10-CM | POA: Diagnosis not present

## 2015-01-19 DIAGNOSIS — Z8673 Personal history of transient ischemic attack (TIA), and cerebral infarction without residual deficits: Secondary | ICD-10-CM | POA: Diagnosis not present

## 2015-01-19 DIAGNOSIS — I509 Heart failure, unspecified: Secondary | ICD-10-CM | POA: Diagnosis not present

## 2015-01-19 DIAGNOSIS — D689 Coagulation defect, unspecified: Secondary | ICD-10-CM | POA: Diagnosis not present

## 2015-01-24 DIAGNOSIS — D689 Coagulation defect, unspecified: Secondary | ICD-10-CM | POA: Diagnosis not present

## 2015-01-24 DIAGNOSIS — L539 Erythematous condition, unspecified: Secondary | ICD-10-CM | POA: Diagnosis not present

## 2015-01-24 DIAGNOSIS — Z8673 Personal history of transient ischemic attack (TIA), and cerebral infarction without residual deficits: Secondary | ICD-10-CM | POA: Diagnosis not present

## 2015-01-24 DIAGNOSIS — F411 Generalized anxiety disorder: Secondary | ICD-10-CM | POA: Diagnosis not present

## 2015-01-24 DIAGNOSIS — F332 Major depressive disorder, recurrent severe without psychotic features: Secondary | ICD-10-CM | POA: Diagnosis not present

## 2015-01-24 DIAGNOSIS — K120 Recurrent oral aphthae: Secondary | ICD-10-CM | POA: Diagnosis not present

## 2015-01-26 DIAGNOSIS — E785 Hyperlipidemia, unspecified: Secondary | ICD-10-CM | POA: Diagnosis not present

## 2015-01-26 DIAGNOSIS — D689 Coagulation defect, unspecified: Secondary | ICD-10-CM | POA: Diagnosis not present

## 2015-01-26 DIAGNOSIS — Z8673 Personal history of transient ischemic attack (TIA), and cerebral infarction without residual deficits: Secondary | ICD-10-CM | POA: Diagnosis not present

## 2015-01-26 DIAGNOSIS — D649 Anemia, unspecified: Secondary | ICD-10-CM | POA: Diagnosis not present

## 2015-01-26 DIAGNOSIS — I5030 Unspecified diastolic (congestive) heart failure: Secondary | ICD-10-CM | POA: Diagnosis not present

## 2015-01-26 DIAGNOSIS — Z79899 Other long term (current) drug therapy: Secondary | ICD-10-CM | POA: Diagnosis not present

## 2015-01-26 DIAGNOSIS — L539 Erythematous condition, unspecified: Secondary | ICD-10-CM | POA: Diagnosis not present

## 2015-01-26 DIAGNOSIS — K120 Recurrent oral aphthae: Secondary | ICD-10-CM | POA: Diagnosis not present

## 2015-01-26 DIAGNOSIS — I509 Heart failure, unspecified: Secondary | ICD-10-CM | POA: Diagnosis not present

## 2015-01-26 DIAGNOSIS — Z7901 Long term (current) use of anticoagulants: Secondary | ICD-10-CM | POA: Diagnosis not present

## 2015-01-31 DIAGNOSIS — F332 Major depressive disorder, recurrent severe without psychotic features: Secondary | ICD-10-CM | POA: Diagnosis not present

## 2015-01-31 DIAGNOSIS — F411 Generalized anxiety disorder: Secondary | ICD-10-CM | POA: Diagnosis not present

## 2015-02-02 DIAGNOSIS — D689 Coagulation defect, unspecified: Secondary | ICD-10-CM | POA: Diagnosis not present

## 2015-02-02 DIAGNOSIS — Z8673 Personal history of transient ischemic attack (TIA), and cerebral infarction without residual deficits: Secondary | ICD-10-CM | POA: Diagnosis not present

## 2015-02-02 DIAGNOSIS — Z7901 Long term (current) use of anticoagulants: Secondary | ICD-10-CM | POA: Diagnosis not present

## 2015-02-04 DIAGNOSIS — L539 Erythematous condition, unspecified: Secondary | ICD-10-CM | POA: Diagnosis not present

## 2015-02-04 DIAGNOSIS — J309 Allergic rhinitis, unspecified: Secondary | ICD-10-CM | POA: Diagnosis not present

## 2015-02-07 DIAGNOSIS — F411 Generalized anxiety disorder: Secondary | ICD-10-CM | POA: Diagnosis not present

## 2015-02-07 DIAGNOSIS — F332 Major depressive disorder, recurrent severe without psychotic features: Secondary | ICD-10-CM | POA: Diagnosis not present

## 2015-02-09 DIAGNOSIS — Z7901 Long term (current) use of anticoagulants: Secondary | ICD-10-CM | POA: Diagnosis not present

## 2015-02-09 DIAGNOSIS — D689 Coagulation defect, unspecified: Secondary | ICD-10-CM | POA: Diagnosis not present

## 2015-02-09 DIAGNOSIS — Z8673 Personal history of transient ischemic attack (TIA), and cerebral infarction without residual deficits: Secondary | ICD-10-CM | POA: Diagnosis not present

## 2015-02-09 DIAGNOSIS — I509 Heart failure, unspecified: Secondary | ICD-10-CM | POA: Diagnosis not present

## 2015-02-12 DIAGNOSIS — D689 Coagulation defect, unspecified: Secondary | ICD-10-CM | POA: Diagnosis not present

## 2015-02-12 DIAGNOSIS — Z8673 Personal history of transient ischemic attack (TIA), and cerebral infarction without residual deficits: Secondary | ICD-10-CM | POA: Diagnosis not present

## 2015-02-14 DIAGNOSIS — F411 Generalized anxiety disorder: Secondary | ICD-10-CM | POA: Diagnosis not present

## 2015-02-14 DIAGNOSIS — F332 Major depressive disorder, recurrent severe without psychotic features: Secondary | ICD-10-CM | POA: Diagnosis not present

## 2015-02-16 DIAGNOSIS — D689 Coagulation defect, unspecified: Secondary | ICD-10-CM | POA: Diagnosis not present

## 2015-02-16 DIAGNOSIS — Z7901 Long term (current) use of anticoagulants: Secondary | ICD-10-CM | POA: Diagnosis not present

## 2015-02-16 DIAGNOSIS — Z8673 Personal history of transient ischemic attack (TIA), and cerebral infarction without residual deficits: Secondary | ICD-10-CM | POA: Diagnosis not present

## 2015-02-17 DIAGNOSIS — I2510 Atherosclerotic heart disease of native coronary artery without angina pectoris: Secondary | ICD-10-CM | POA: Diagnosis not present

## 2015-02-17 DIAGNOSIS — Z79899 Other long term (current) drug therapy: Secondary | ICD-10-CM | POA: Diagnosis not present

## 2015-02-17 DIAGNOSIS — E039 Hypothyroidism, unspecified: Secondary | ICD-10-CM | POA: Diagnosis not present

## 2015-02-17 DIAGNOSIS — D649 Anemia, unspecified: Secondary | ICD-10-CM | POA: Diagnosis not present

## 2015-02-17 DIAGNOSIS — E785 Hyperlipidemia, unspecified: Secondary | ICD-10-CM | POA: Diagnosis not present

## 2015-02-17 DIAGNOSIS — B009 Herpesviral infection, unspecified: Secondary | ICD-10-CM | POA: Diagnosis not present

## 2015-02-17 DIAGNOSIS — R079 Chest pain, unspecified: Secondary | ICD-10-CM | POA: Diagnosis not present

## 2015-02-19 DIAGNOSIS — F419 Anxiety disorder, unspecified: Secondary | ICD-10-CM | POA: Diagnosis not present

## 2015-02-19 DIAGNOSIS — Z8673 Personal history of transient ischemic attack (TIA), and cerebral infarction without residual deficits: Secondary | ICD-10-CM | POA: Diagnosis not present

## 2015-02-19 DIAGNOSIS — D689 Coagulation defect, unspecified: Secondary | ICD-10-CM | POA: Diagnosis not present

## 2015-02-19 DIAGNOSIS — I2510 Atherosclerotic heart disease of native coronary artery without angina pectoris: Secondary | ICD-10-CM | POA: Diagnosis not present

## 2015-02-21 DIAGNOSIS — F332 Major depressive disorder, recurrent severe without psychotic features: Secondary | ICD-10-CM | POA: Diagnosis not present

## 2015-02-21 DIAGNOSIS — F411 Generalized anxiety disorder: Secondary | ICD-10-CM | POA: Diagnosis not present

## 2015-02-23 DIAGNOSIS — Z7901 Long term (current) use of anticoagulants: Secondary | ICD-10-CM | POA: Diagnosis not present

## 2015-02-23 DIAGNOSIS — D689 Coagulation defect, unspecified: Secondary | ICD-10-CM | POA: Diagnosis not present

## 2015-02-27 DIAGNOSIS — R079 Chest pain, unspecified: Secondary | ICD-10-CM | POA: Diagnosis not present

## 2015-02-27 DIAGNOSIS — Z7901 Long term (current) use of anticoagulants: Secondary | ICD-10-CM | POA: Diagnosis not present

## 2015-02-28 DIAGNOSIS — F332 Major depressive disorder, recurrent severe without psychotic features: Secondary | ICD-10-CM | POA: Diagnosis not present

## 2015-02-28 DIAGNOSIS — F411 Generalized anxiety disorder: Secondary | ICD-10-CM | POA: Diagnosis not present

## 2015-03-02 DIAGNOSIS — I5030 Unspecified diastolic (congestive) heart failure: Secondary | ICD-10-CM | POA: Diagnosis not present

## 2015-03-02 DIAGNOSIS — Z8673 Personal history of transient ischemic attack (TIA), and cerebral infarction without residual deficits: Secondary | ICD-10-CM | POA: Diagnosis not present

## 2015-03-02 DIAGNOSIS — D649 Anemia, unspecified: Secondary | ICD-10-CM | POA: Diagnosis not present

## 2015-03-02 DIAGNOSIS — Z7901 Long term (current) use of anticoagulants: Secondary | ICD-10-CM | POA: Diagnosis not present

## 2015-03-02 DIAGNOSIS — E785 Hyperlipidemia, unspecified: Secondary | ICD-10-CM | POA: Diagnosis not present

## 2015-03-02 DIAGNOSIS — Z79899 Other long term (current) drug therapy: Secondary | ICD-10-CM | POA: Diagnosis not present

## 2015-03-02 DIAGNOSIS — I509 Heart failure, unspecified: Secondary | ICD-10-CM | POA: Diagnosis not present

## 2015-03-06 DIAGNOSIS — Z7901 Long term (current) use of anticoagulants: Secondary | ICD-10-CM | POA: Diagnosis not present

## 2015-03-06 DIAGNOSIS — Z8673 Personal history of transient ischemic attack (TIA), and cerebral infarction without residual deficits: Secondary | ICD-10-CM | POA: Diagnosis not present

## 2015-03-08 DIAGNOSIS — M79675 Pain in left toe(s): Secondary | ICD-10-CM | POA: Diagnosis not present

## 2015-03-08 DIAGNOSIS — B351 Tinea unguium: Secondary | ICD-10-CM | POA: Diagnosis not present

## 2015-03-08 DIAGNOSIS — M79674 Pain in right toe(s): Secondary | ICD-10-CM | POA: Diagnosis not present

## 2015-03-09 DIAGNOSIS — Z7901 Long term (current) use of anticoagulants: Secondary | ICD-10-CM | POA: Diagnosis not present

## 2015-03-09 DIAGNOSIS — Z8673 Personal history of transient ischemic attack (TIA), and cerebral infarction without residual deficits: Secondary | ICD-10-CM | POA: Diagnosis not present

## 2015-03-14 DIAGNOSIS — F411 Generalized anxiety disorder: Secondary | ICD-10-CM | POA: Diagnosis not present

## 2015-03-14 DIAGNOSIS — F332 Major depressive disorder, recurrent severe without psychotic features: Secondary | ICD-10-CM | POA: Diagnosis not present

## 2015-03-16 DIAGNOSIS — R079 Chest pain, unspecified: Secondary | ICD-10-CM | POA: Diagnosis not present

## 2015-03-16 DIAGNOSIS — I10 Essential (primary) hypertension: Secondary | ICD-10-CM | POA: Diagnosis not present

## 2015-03-16 DIAGNOSIS — Z7901 Long term (current) use of anticoagulants: Secondary | ICD-10-CM | POA: Diagnosis not present

## 2015-03-16 DIAGNOSIS — F419 Anxiety disorder, unspecified: Secondary | ICD-10-CM | POA: Diagnosis not present

## 2015-03-16 DIAGNOSIS — Z8673 Personal history of transient ischemic attack (TIA), and cerebral infarction without residual deficits: Secondary | ICD-10-CM | POA: Diagnosis not present

## 2015-03-16 DIAGNOSIS — I2510 Atherosclerotic heart disease of native coronary artery without angina pectoris: Secondary | ICD-10-CM | POA: Diagnosis not present

## 2015-03-16 DIAGNOSIS — F3130 Bipolar disorder, current episode depressed, mild or moderate severity, unspecified: Secondary | ICD-10-CM | POA: Diagnosis not present

## 2015-03-21 DIAGNOSIS — F3130 Bipolar disorder, current episode depressed, mild or moderate severity, unspecified: Secondary | ICD-10-CM | POA: Diagnosis not present

## 2015-03-21 DIAGNOSIS — F332 Major depressive disorder, recurrent severe without psychotic features: Secondary | ICD-10-CM | POA: Diagnosis not present

## 2015-03-21 DIAGNOSIS — F411 Generalized anxiety disorder: Secondary | ICD-10-CM | POA: Diagnosis not present

## 2015-03-21 DIAGNOSIS — Z8673 Personal history of transient ischemic attack (TIA), and cerebral infarction without residual deficits: Secondary | ICD-10-CM | POA: Diagnosis not present

## 2015-03-21 DIAGNOSIS — F419 Anxiety disorder, unspecified: Secondary | ICD-10-CM | POA: Diagnosis not present

## 2015-03-21 DIAGNOSIS — R079 Chest pain, unspecified: Secondary | ICD-10-CM | POA: Diagnosis not present

## 2015-03-21 DIAGNOSIS — I10 Essential (primary) hypertension: Secondary | ICD-10-CM | POA: Diagnosis not present

## 2015-03-21 DIAGNOSIS — I2510 Atherosclerotic heart disease of native coronary artery without angina pectoris: Secondary | ICD-10-CM | POA: Diagnosis not present

## 2015-03-23 DIAGNOSIS — Z7901 Long term (current) use of anticoagulants: Secondary | ICD-10-CM | POA: Diagnosis not present

## 2015-03-23 DIAGNOSIS — Z8673 Personal history of transient ischemic attack (TIA), and cerebral infarction without residual deficits: Secondary | ICD-10-CM | POA: Diagnosis not present

## 2015-03-30 DIAGNOSIS — Z7901 Long term (current) use of anticoagulants: Secondary | ICD-10-CM | POA: Diagnosis not present

## 2015-04-03 DIAGNOSIS — F3130 Bipolar disorder, current episode depressed, mild or moderate severity, unspecified: Secondary | ICD-10-CM | POA: Diagnosis not present

## 2015-04-03 DIAGNOSIS — I10 Essential (primary) hypertension: Secondary | ICD-10-CM | POA: Diagnosis not present

## 2015-04-03 DIAGNOSIS — B009 Herpesviral infection, unspecified: Secondary | ICD-10-CM | POA: Diagnosis not present

## 2015-04-03 DIAGNOSIS — Z8673 Personal history of transient ischemic attack (TIA), and cerebral infarction without residual deficits: Secondary | ICD-10-CM | POA: Diagnosis not present

## 2015-04-03 DIAGNOSIS — D649 Anemia, unspecified: Secondary | ICD-10-CM | POA: Diagnosis not present

## 2015-04-03 DIAGNOSIS — Z7901 Long term (current) use of anticoagulants: Secondary | ICD-10-CM | POA: Diagnosis not present

## 2015-04-03 DIAGNOSIS — E039 Hypothyroidism, unspecified: Secondary | ICD-10-CM | POA: Diagnosis not present

## 2015-04-03 DIAGNOSIS — I2510 Atherosclerotic heart disease of native coronary artery without angina pectoris: Secondary | ICD-10-CM | POA: Diagnosis not present

## 2015-04-03 DIAGNOSIS — Z79899 Other long term (current) drug therapy: Secondary | ICD-10-CM | POA: Diagnosis not present

## 2015-04-03 DIAGNOSIS — R079 Chest pain, unspecified: Secondary | ICD-10-CM | POA: Diagnosis not present

## 2015-04-03 DIAGNOSIS — F419 Anxiety disorder, unspecified: Secondary | ICD-10-CM | POA: Diagnosis not present

## 2015-04-04 DIAGNOSIS — F411 Generalized anxiety disorder: Secondary | ICD-10-CM | POA: Diagnosis not present

## 2015-04-04 DIAGNOSIS — F332 Major depressive disorder, recurrent severe without psychotic features: Secondary | ICD-10-CM | POA: Diagnosis not present

## 2015-04-10 DIAGNOSIS — Z8673 Personal history of transient ischemic attack (TIA), and cerebral infarction without residual deficits: Secondary | ICD-10-CM | POA: Diagnosis not present

## 2015-04-10 DIAGNOSIS — Z7901 Long term (current) use of anticoagulants: Secondary | ICD-10-CM | POA: Diagnosis not present

## 2015-04-10 DIAGNOSIS — I2510 Atherosclerotic heart disease of native coronary artery without angina pectoris: Secondary | ICD-10-CM | POA: Diagnosis not present

## 2015-04-11 DIAGNOSIS — F332 Major depressive disorder, recurrent severe without psychotic features: Secondary | ICD-10-CM | POA: Diagnosis not present

## 2015-04-11 DIAGNOSIS — F411 Generalized anxiety disorder: Secondary | ICD-10-CM | POA: Diagnosis not present

## 2015-04-12 DIAGNOSIS — Z7901 Long term (current) use of anticoagulants: Secondary | ICD-10-CM | POA: Diagnosis not present

## 2015-04-12 DIAGNOSIS — Z8673 Personal history of transient ischemic attack (TIA), and cerebral infarction without residual deficits: Secondary | ICD-10-CM | POA: Diagnosis not present

## 2015-04-18 DIAGNOSIS — F411 Generalized anxiety disorder: Secondary | ICD-10-CM | POA: Diagnosis not present

## 2015-04-18 DIAGNOSIS — F332 Major depressive disorder, recurrent severe without psychotic features: Secondary | ICD-10-CM | POA: Diagnosis not present

## 2015-04-19 DIAGNOSIS — Z8673 Personal history of transient ischemic attack (TIA), and cerebral infarction without residual deficits: Secondary | ICD-10-CM | POA: Diagnosis not present

## 2015-04-19 DIAGNOSIS — Z7901 Long term (current) use of anticoagulants: Secondary | ICD-10-CM | POA: Diagnosis not present

## 2015-04-21 DIAGNOSIS — I5030 Unspecified diastolic (congestive) heart failure: Secondary | ICD-10-CM | POA: Diagnosis not present

## 2015-04-21 DIAGNOSIS — G40901 Epilepsy, unspecified, not intractable, with status epilepticus: Secondary | ICD-10-CM | POA: Diagnosis not present

## 2015-04-21 DIAGNOSIS — I509 Heart failure, unspecified: Secondary | ICD-10-CM | POA: Diagnosis not present

## 2015-04-26 DIAGNOSIS — I2510 Atherosclerotic heart disease of native coronary artery without angina pectoris: Secondary | ICD-10-CM | POA: Diagnosis not present

## 2015-04-26 DIAGNOSIS — Z8673 Personal history of transient ischemic attack (TIA), and cerebral infarction without residual deficits: Secondary | ICD-10-CM | POA: Diagnosis not present

## 2015-04-26 DIAGNOSIS — R079 Chest pain, unspecified: Secondary | ICD-10-CM | POA: Diagnosis not present

## 2015-04-26 DIAGNOSIS — Z7901 Long term (current) use of anticoagulants: Secondary | ICD-10-CM | POA: Diagnosis not present

## 2015-04-26 DIAGNOSIS — G40909 Epilepsy, unspecified, not intractable, without status epilepticus: Secondary | ICD-10-CM | POA: Diagnosis not present

## 2015-04-29 DIAGNOSIS — Z7901 Long term (current) use of anticoagulants: Secondary | ICD-10-CM | POA: Diagnosis not present

## 2015-04-29 DIAGNOSIS — I509 Heart failure, unspecified: Secondary | ICD-10-CM | POA: Diagnosis not present

## 2015-04-29 DIAGNOSIS — G40909 Epilepsy, unspecified, not intractable, without status epilepticus: Secondary | ICD-10-CM | POA: Diagnosis not present

## 2015-04-29 DIAGNOSIS — Z8673 Personal history of transient ischemic attack (TIA), and cerebral infarction without residual deficits: Secondary | ICD-10-CM | POA: Diagnosis not present

## 2015-04-29 DIAGNOSIS — F3130 Bipolar disorder, current episode depressed, mild or moderate severity, unspecified: Secondary | ICD-10-CM | POA: Diagnosis not present

## 2015-05-02 DIAGNOSIS — I2510 Atherosclerotic heart disease of native coronary artery without angina pectoris: Secondary | ICD-10-CM | POA: Diagnosis not present

## 2015-05-02 DIAGNOSIS — F332 Major depressive disorder, recurrent severe without psychotic features: Secondary | ICD-10-CM | POA: Diagnosis not present

## 2015-05-02 DIAGNOSIS — Z8673 Personal history of transient ischemic attack (TIA), and cerebral infarction without residual deficits: Secondary | ICD-10-CM | POA: Diagnosis not present

## 2015-05-02 DIAGNOSIS — B009 Herpesviral infection, unspecified: Secondary | ICD-10-CM | POA: Diagnosis not present

## 2015-05-02 DIAGNOSIS — D649 Anemia, unspecified: Secondary | ICD-10-CM | POA: Diagnosis not present

## 2015-05-02 DIAGNOSIS — Z7901 Long term (current) use of anticoagulants: Secondary | ICD-10-CM | POA: Diagnosis not present

## 2015-05-02 DIAGNOSIS — G40909 Epilepsy, unspecified, not intractable, without status epilepticus: Secondary | ICD-10-CM | POA: Diagnosis not present

## 2015-05-02 DIAGNOSIS — E039 Hypothyroidism, unspecified: Secondary | ICD-10-CM | POA: Diagnosis not present

## 2015-05-02 DIAGNOSIS — Z79899 Other long term (current) drug therapy: Secondary | ICD-10-CM | POA: Diagnosis not present

## 2015-05-02 DIAGNOSIS — F411 Generalized anxiety disorder: Secondary | ICD-10-CM | POA: Diagnosis not present

## 2015-05-05 DIAGNOSIS — Z7901 Long term (current) use of anticoagulants: Secondary | ICD-10-CM | POA: Diagnosis not present

## 2015-05-05 DIAGNOSIS — I509 Heart failure, unspecified: Secondary | ICD-10-CM | POA: Diagnosis not present

## 2015-05-05 DIAGNOSIS — Z8673 Personal history of transient ischemic attack (TIA), and cerebral infarction without residual deficits: Secondary | ICD-10-CM | POA: Diagnosis not present

## 2015-05-06 DIAGNOSIS — N390 Urinary tract infection, site not specified: Secondary | ICD-10-CM | POA: Diagnosis not present

## 2015-05-06 DIAGNOSIS — R319 Hematuria, unspecified: Secondary | ICD-10-CM | POA: Diagnosis not present

## 2015-05-06 DIAGNOSIS — R300 Dysuria: Secondary | ICD-10-CM | POA: Diagnosis not present

## 2015-05-08 DIAGNOSIS — I10 Essential (primary) hypertension: Secondary | ICD-10-CM | POA: Diagnosis not present

## 2015-05-08 DIAGNOSIS — H4011X4 Primary open-angle glaucoma, indeterminate stage: Secondary | ICD-10-CM | POA: Diagnosis not present

## 2015-05-08 DIAGNOSIS — D689 Coagulation defect, unspecified: Secondary | ICD-10-CM | POA: Diagnosis not present

## 2015-05-08 DIAGNOSIS — N390 Urinary tract infection, site not specified: Secondary | ICD-10-CM | POA: Diagnosis not present

## 2015-05-08 DIAGNOSIS — Z8673 Personal history of transient ischemic attack (TIA), and cerebral infarction without residual deficits: Secondary | ICD-10-CM | POA: Diagnosis not present

## 2015-05-08 DIAGNOSIS — Z7901 Long term (current) use of anticoagulants: Secondary | ICD-10-CM | POA: Diagnosis not present

## 2015-05-09 DIAGNOSIS — R569 Unspecified convulsions: Secondary | ICD-10-CM

## 2015-05-09 DIAGNOSIS — J438 Other emphysema: Secondary | ICD-10-CM

## 2015-05-09 DIAGNOSIS — E785 Hyperlipidemia, unspecified: Secondary | ICD-10-CM

## 2015-05-09 DIAGNOSIS — I2510 Atherosclerotic heart disease of native coronary artery without angina pectoris: Secondary | ICD-10-CM | POA: Insufficient documentation

## 2015-05-09 DIAGNOSIS — F332 Major depressive disorder, recurrent severe without psychotic features: Secondary | ICD-10-CM | POA: Diagnosis not present

## 2015-05-09 DIAGNOSIS — J449 Chronic obstructive pulmonary disease, unspecified: Secondary | ICD-10-CM | POA: Insufficient documentation

## 2015-05-09 DIAGNOSIS — F319 Bipolar disorder, unspecified: Secondary | ICD-10-CM

## 2015-05-09 DIAGNOSIS — F411 Generalized anxiety disorder: Secondary | ICD-10-CM | POA: Diagnosis not present

## 2015-05-09 DIAGNOSIS — G8220 Paraplegia, unspecified: Secondary | ICD-10-CM

## 2015-05-09 DIAGNOSIS — I52 Other heart disorders in diseases classified elsewhere: Secondary | ICD-10-CM | POA: Insufficient documentation

## 2015-05-09 DIAGNOSIS — F419 Anxiety disorder, unspecified: Secondary | ICD-10-CM

## 2015-05-09 DIAGNOSIS — F329 Major depressive disorder, single episode, unspecified: Secondary | ICD-10-CM | POA: Insufficient documentation

## 2015-05-09 DIAGNOSIS — Z951 Presence of aortocoronary bypass graft: Secondary | ICD-10-CM

## 2015-05-09 DIAGNOSIS — I639 Cerebral infarction, unspecified: Secondary | ICD-10-CM

## 2015-05-09 DIAGNOSIS — E039 Hypothyroidism, unspecified: Secondary | ICD-10-CM | POA: Insufficient documentation

## 2015-05-09 DIAGNOSIS — F32A Depression, unspecified: Secondary | ICD-10-CM

## 2015-05-09 DIAGNOSIS — K219 Gastro-esophageal reflux disease without esophagitis: Secondary | ICD-10-CM

## 2015-05-09 DIAGNOSIS — G8929 Other chronic pain: Secondary | ICD-10-CM

## 2015-05-09 DIAGNOSIS — E038 Other specified hypothyroidism: Secondary | ICD-10-CM

## 2015-05-09 DIAGNOSIS — R531 Weakness: Secondary | ICD-10-CM

## 2015-05-09 DIAGNOSIS — I10 Essential (primary) hypertension: Secondary | ICD-10-CM

## 2015-05-11 DIAGNOSIS — D689 Coagulation defect, unspecified: Secondary | ICD-10-CM | POA: Diagnosis not present

## 2015-05-11 DIAGNOSIS — N390 Urinary tract infection, site not specified: Secondary | ICD-10-CM | POA: Diagnosis not present

## 2015-05-11 DIAGNOSIS — Z7901 Long term (current) use of anticoagulants: Secondary | ICD-10-CM | POA: Diagnosis not present

## 2015-05-11 DIAGNOSIS — I509 Heart failure, unspecified: Secondary | ICD-10-CM | POA: Diagnosis not present

## 2015-05-11 DIAGNOSIS — I10 Essential (primary) hypertension: Secondary | ICD-10-CM | POA: Diagnosis not present

## 2015-05-11 DIAGNOSIS — Z8673 Personal history of transient ischemic attack (TIA), and cerebral infarction without residual deficits: Secondary | ICD-10-CM | POA: Diagnosis not present

## 2015-05-15 DIAGNOSIS — Z8673 Personal history of transient ischemic attack (TIA), and cerebral infarction without residual deficits: Secondary | ICD-10-CM | POA: Diagnosis not present

## 2015-05-15 DIAGNOSIS — N390 Urinary tract infection, site not specified: Secondary | ICD-10-CM | POA: Diagnosis not present

## 2015-05-15 DIAGNOSIS — Z7901 Long term (current) use of anticoagulants: Secondary | ICD-10-CM | POA: Diagnosis not present

## 2015-05-16 ENCOUNTER — Encounter: Payer: Self-pay | Admitting: Cardiology

## 2015-05-16 ENCOUNTER — Ambulatory Visit (INDEPENDENT_AMBULATORY_CARE_PROVIDER_SITE_OTHER): Payer: Medicare Other | Admitting: Cardiology

## 2015-05-16 VITALS — BP 106/58 | HR 94 | Ht 66.0 in | Wt 298.0 lb

## 2015-05-16 DIAGNOSIS — F411 Generalized anxiety disorder: Secondary | ICD-10-CM | POA: Diagnosis not present

## 2015-05-16 DIAGNOSIS — I2510 Atherosclerotic heart disease of native coronary artery without angina pectoris: Secondary | ICD-10-CM | POA: Diagnosis not present

## 2015-05-16 DIAGNOSIS — R0789 Other chest pain: Secondary | ICD-10-CM

## 2015-05-16 DIAGNOSIS — F332 Major depressive disorder, recurrent severe without psychotic features: Secondary | ICD-10-CM | POA: Diagnosis not present

## 2015-05-16 MED ORDER — METOPROLOL TARTRATE 25 MG PO TABS: 25.0000 mg | ORAL_TABLET | Freq: Two times a day (BID) | ORAL | Status: DC

## 2015-05-16 NOTE — Patient Instructions (Signed)
Your physician recommends that you schedule a follow-up appointment in: [place]  Your physician has requested that you have a lexiscan myoview. For further information please visit [email or web address]. Please follow instruction sheet, as given. * ON DAY OF TEST DO NOT TAKE IMDUR *  Thanks for choosing [facility]!!!

## 2015-05-16 NOTE — Progress Notes (Signed)
Patient ID: Karen Mccann, female   DOB: July 13, 1951, 64 y.o.   MRN: [id number]     Clinical Summary Karen Mccann is a 64 y.o.female seen today as a new patient for the following medical problems.  1. 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 She reports CAGB at [facility] in 2005. - chest pain started 2-3 months ago. Typically happens with getting angry. Pressure/sharp pain midchest, 7-8/10. +SOB, + palpitations. Not positional. No relation to food. Pain lasts for approx 5 min to 1 hour. Symptoms occur 2-3 times a week. Increased frequency, increased severity. Symptoms better with NG.   2. History of CVA - appears she has been on coumadin since her stroke in 2005     PMH 1. CAD with prior CABG 2. CVA   Allergies  Allergen Reactions  . Latex Other (See Comments)    unknown     Current Outpatient Prescriptions  Medication Sig Dispense Refill  . acetaminophen (TYLENOL) 325 MG tablet Take 650 mg by mouth every 4 (four) hours as needed.    Marland Kitchen acyclovir (ZOVIRAX) 400 MG tablet Take 400 mg by mouth 3 (three) times daily.    Marland Kitchen aspirin EC 81 MG tablet Take 81 mg by mouth daily.    . calcium-vitamin D (OSCAL WITH D) 500-200 MG-UNIT per tablet Take 1 tablet by mouth daily with breakfast.    . cetirizine (ZYRTEC) 10 MG tablet Take 10 mg by mouth daily.    . Cholecalciferol (VITAMIN D3) 3000 UNITS TABS Take 1,000 Units by mouth daily.    . DULoxetine (CYMBALTA) 60 MG capsule Take 60 mg by mouth at bedtime. 2 tabs hs    . esomeprazole (NEXIUM) 40 MG capsule Take 40 mg by mouth daily at 12 noon.    . fentaNYL (DURAGESIC - DOSED MCG/HR) 50 MCG/HR Place 50 mcg onto the skin every 3 (three) days.    . fluticasone (FLONASE) 50 MCG/ACT nasal spray Place 1 spray into both nostrils at bedtime.    . furosemide (LASIX) 20 MG tablet Take 20 mg by mouth.    . hydrOXYzine (ATARAX/VISTARIL) 50 MG tablet Take 50 mg by mouth at bedtime as needed.    Marland Kitchen ipratropium-albuterol (DUONEB) 0.5-2.5 (3)  MG/3ML SOLN Take 3 mLs by nebulization 3 (three) times daily as needed.    . isosorbide mononitrate (IMDUR) 30 MG 24 hr tablet Take 30 mg by mouth daily.    Marland Kitchen levothyroxine (SYNTHROID, LEVOTHROID) 100 MCG tablet Take 100 mcg by mouth daily before breakfast.    . metoprolol (LOPRESSOR) 50 MG tablet Take 50 mg by mouth daily.    . Multiple Vitamins-Minerals (MULTIVITAMIN ADULT PO) Take 1 tablet by mouth daily.    . nitroGLYCERIN (NITROSTAT) 0.4 MG SL tablet Place 0.4 mg under the tongue every 5 (five) minutes as needed for chest pain.    . nortriptyline (PAMELOR) 50 MG capsule Take 50 mg by mouth at bedtime.    Marland Kitchen omeprazole (PRILOSEC) 20 MG capsule Take 20 mg by mouth daily.    . ondansetron (ZOFRAN) 4 MG tablet Take 4 mg by mouth every 8 (eight) hours as needed for nausea or vomiting.    . phenytoin (DILANTIN) 100 MG ER capsule Take 100 mg by mouth 2 (two) times daily. 2 tabs am,3 tabs at hs    . pregabalin (LYRICA) 75 MG capsule Take 75 mg by mouth daily.    Marland Kitchen Propylene Glycol (SYSTANE BALANCE) 0.6 % SOLN Apply 1 drop to eye daily.  Both eyes    . warfarin (COUMADIN) 6 MG tablet Take 6 mg by mouth daily.     No current facility-administered medications for this visit.     No past surgical history on file.   Allergies  Allergen Reactions  . Latex Other (See Comments)    unknown      No family history on file.   Social History Karen Mccann has no tobacco history on file. Karen Mccann has no alcohol history on file.   Review of Systems CONSTITUTIONAL: No weight loss, fever, chills, weakness or fatigue.  HEENT: Eyes: No visual loss, blurred vision, double vision or yellow sclerae.No hearing loss, sneezing, congestion, runny nose or sore throat.  SKIN: No rash or itching.  CARDIOVASCULAR: per HPI RESPIRATORY: No shortness of breath, cough or sputum.  GASTROINTESTINAL: No anorexia, nausea, vomiting or diarrhea. No abdominal pain or blood.  GENITOURINARY: No burning on urination, no  polyuria NEUROLOGICAL: No headache, dizziness, syncope, paralysis, ataxia, numbness or tingling in the extremities. No change in bowel or bladder control.  MUSCULOSKELETAL: No muscle, back pain, joint pain or stiffness.  LYMPHATICS: No enlarged nodes. No history of splenectomy.  PSYCHIATRIC: No history of depression or anxiety.  ENDOCRINOLOGIC: No reports of sweating, cold or heat intolerance. No polyuria or polydipsia.  Marland Kitchen   Physical Examination Filed Vitals:   05/16/15 1015  BP: 106/58  Pulse: 94   Filed Vitals:   05/16/15 1015  Height: 5\' 6"  (1.676 m)  Weight: 298 lb (135.172 kg)    Gen: resting comfortably, no acute distress HEENT: no scleral icterus, pupils equal round and reactive, no palptable cervical adenopathy,  CV: RRR, no m/r/g, no JVD Resp: Clear to auscultation bilaterally GI: abdomen is soft, non-tender, non-distended, normal bowel sounds, no hepatosplenomegaly MSK: extremities are warm, no edema.  Skin: warm, no rash Neuro:  no focal deficits Psych: appropriate affect     Assessment and Plan  1. 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 Will ask for 2 day protocol due to body habitus.  - she is not on statin for unclear reasons, will look further into history.   F/u pending stress results .    Arnoldo Lenis, M.D.

## 2015-05-18 DIAGNOSIS — Z7901 Long term (current) use of anticoagulants: Secondary | ICD-10-CM | POA: Diagnosis not present

## 2015-05-18 DIAGNOSIS — N390 Urinary tract infection, site not specified: Secondary | ICD-10-CM | POA: Diagnosis not present

## 2015-05-18 DIAGNOSIS — R079 Chest pain, unspecified: Secondary | ICD-10-CM | POA: Diagnosis not present

## 2015-05-18 DIAGNOSIS — I509 Heart failure, unspecified: Secondary | ICD-10-CM | POA: Diagnosis not present

## 2015-05-18 DIAGNOSIS — Z8673 Personal history of transient ischemic attack (TIA), and cerebral infarction without residual deficits: Secondary | ICD-10-CM | POA: Diagnosis not present

## 2015-05-22 DIAGNOSIS — Z7901 Long term (current) use of anticoagulants: Secondary | ICD-10-CM | POA: Diagnosis not present

## 2015-05-22 DIAGNOSIS — R079 Chest pain, unspecified: Secondary | ICD-10-CM | POA: Diagnosis not present

## 2015-05-22 DIAGNOSIS — Z8673 Personal history of transient ischemic attack (TIA), and cerebral infarction without residual deficits: Secondary | ICD-10-CM | POA: Diagnosis not present

## 2015-05-23 ENCOUNTER — Encounter (HOSPITAL_COMMUNITY)
Admission: RE | Admit: 2015-05-23 | Discharge: 2015-05-23 | Disposition: A | Discharge status: Home / Self Care | Payer: Medicare Other | Source: Ambulatory Visit | Attending: Cardiology | Admitting: Cardiology

## 2015-05-23 ENCOUNTER — Encounter (HOSPITAL_COMMUNITY): Admission: RE | Admit: 2015-05-23 | Payer: Medicare Other | Source: Ambulatory Visit

## 2015-05-23 ENCOUNTER — Inpatient Hospital Stay (HOSPITAL_COMMUNITY): Admission: RE | Admit: 2015-05-23 | Payer: Medicaid Other | Source: Ambulatory Visit

## 2015-05-23 ENCOUNTER — Encounter (HOSPITAL_COMMUNITY): Payer: Self-pay

## 2015-05-23 DIAGNOSIS — F332 Major depressive disorder, recurrent severe without psychotic features: Secondary | ICD-10-CM | POA: Diagnosis not present

## 2015-05-23 DIAGNOSIS — F411 Generalized anxiety disorder: Secondary | ICD-10-CM | POA: Diagnosis not present

## 2015-05-23 DIAGNOSIS — R0789 Other chest pain: Secondary | ICD-10-CM

## 2015-05-23 MED ORDER — TECHNETIUM TC 99M SESTAMIBI GENERIC - CARDIOLITE
30.0000 | Freq: Once | INTRAVENOUS | Status: AC | PRN
  Administered 2015-05-23: 30 via INTRAVENOUS

## 2015-05-23 MED ORDER — REGADENOSON 0.4 MG/5ML IV SOLN
INTRAVENOUS | Status: AC
  Administered 2015-05-23: 0.4 mg via INTRAVENOUS
  Filled 2015-05-23: qty 5

## 2015-05-23 MED ORDER — SODIUM CHLORIDE 0.9 % IJ SOLN
INTRAMUSCULAR | Status: AC
  Administered 2015-05-23: 10 mL via INTRAVENOUS
  Filled 2015-05-23: qty 3

## 2015-05-24 ENCOUNTER — Encounter (HOSPITAL_COMMUNITY)
Admission: RE | Admit: 2015-05-24 | Discharge: 2015-05-24 | Disposition: A | Discharge status: Home / Self Care | Payer: Medicare Other | Source: Ambulatory Visit | Attending: Cardiology | Admitting: Cardiology

## 2015-05-24 ENCOUNTER — Encounter (HOSPITAL_COMMUNITY): Payer: Self-pay

## 2015-05-24 DIAGNOSIS — R0789 Other chest pain: Secondary | ICD-10-CM | POA: Diagnosis not present

## 2015-05-24 LAB — NM MYOCAR MULTI W/SPECT W/WALL MOTION / EF
CHL CUP NUCLEAR SRS: 7
CHL CUP NUCLEAR SSS: 8
CSEPPHR: 90 {beats}/min
LVDIAVOL: 84 mL
LVSYSVOL: 37 mL
NUC STRESS TID: 0.95
RATE: 0.31
Rest HR: 74 {beats}/min
SDS: 1

## 2015-05-24 MED ORDER — TECHNETIUM TC 99M SESTAMIBI GENERIC - CARDIOLITE
30.0000 | Freq: Once | INTRAVENOUS | Status: AC | PRN
  Administered 2015-05-24: 30 via INTRAVENOUS

## 2015-05-25 DIAGNOSIS — R10819 Abdominal tenderness, unspecified site: Secondary | ICD-10-CM | POA: Diagnosis not present

## 2015-05-25 DIAGNOSIS — K219 Gastro-esophageal reflux disease without esophagitis: Secondary | ICD-10-CM | POA: Diagnosis not present

## 2015-05-25 DIAGNOSIS — Z8673 Personal history of transient ischemic attack (TIA), and cerebral infarction without residual deficits: Secondary | ICD-10-CM | POA: Diagnosis not present

## 2015-05-25 DIAGNOSIS — K120 Recurrent oral aphthae: Secondary | ICD-10-CM | POA: Diagnosis not present

## 2015-05-25 DIAGNOSIS — R079 Chest pain, unspecified: Secondary | ICD-10-CM | POA: Diagnosis not present

## 2015-05-25 DIAGNOSIS — N390 Urinary tract infection, site not specified: Secondary | ICD-10-CM | POA: Diagnosis not present

## 2015-05-25 DIAGNOSIS — R701 Abnormal plasma viscosity: Secondary | ICD-10-CM | POA: Diagnosis not present

## 2015-05-26 DIAGNOSIS — N390 Urinary tract infection, site not specified: Secondary | ICD-10-CM | POA: Diagnosis not present

## 2015-05-26 DIAGNOSIS — R701 Abnormal plasma viscosity: Secondary | ICD-10-CM | POA: Diagnosis not present

## 2015-05-26 DIAGNOSIS — Z8673 Personal history of transient ischemic attack (TIA), and cerebral infarction without residual deficits: Secondary | ICD-10-CM | POA: Diagnosis not present

## 2015-05-26 DIAGNOSIS — R079 Chest pain, unspecified: Secondary | ICD-10-CM | POA: Diagnosis not present

## 2015-05-26 DIAGNOSIS — K219 Gastro-esophageal reflux disease without esophagitis: Secondary | ICD-10-CM | POA: Diagnosis not present

## 2015-05-26 DIAGNOSIS — R10819 Abdominal tenderness, unspecified site: Secondary | ICD-10-CM | POA: Diagnosis not present

## 2015-05-26 DIAGNOSIS — K120 Recurrent oral aphthae: Secondary | ICD-10-CM | POA: Diagnosis not present

## 2015-05-27 DIAGNOSIS — D649 Anemia, unspecified: Secondary | ICD-10-CM | POA: Diagnosis not present

## 2015-05-27 DIAGNOSIS — R079 Chest pain, unspecified: Secondary | ICD-10-CM | POA: Diagnosis not present

## 2015-05-27 DIAGNOSIS — N390 Urinary tract infection, site not specified: Secondary | ICD-10-CM | POA: Diagnosis not present

## 2015-05-27 DIAGNOSIS — R10819 Abdominal tenderness, unspecified site: Secondary | ICD-10-CM | POA: Diagnosis not present

## 2015-05-27 DIAGNOSIS — Z8673 Personal history of transient ischemic attack (TIA), and cerebral infarction without residual deficits: Secondary | ICD-10-CM | POA: Diagnosis not present

## 2015-05-27 DIAGNOSIS — R701 Abnormal plasma viscosity: Secondary | ICD-10-CM | POA: Diagnosis not present

## 2015-05-27 DIAGNOSIS — K120 Recurrent oral aphthae: Secondary | ICD-10-CM | POA: Diagnosis not present

## 2015-05-27 DIAGNOSIS — K219 Gastro-esophageal reflux disease without esophagitis: Secondary | ICD-10-CM | POA: Diagnosis not present

## 2015-05-27 DIAGNOSIS — I5030 Unspecified diastolic (congestive) heart failure: Secondary | ICD-10-CM | POA: Diagnosis not present

## 2015-05-29 ENCOUNTER — Ambulatory Visit (HOSPITAL_COMMUNITY)
Admission: RE | Admit: 2015-05-29 | Discharge: 2015-05-29 | Disposition: A | Discharge status: Home / Self Care | Payer: Medicare Other | Source: Ambulatory Visit | Attending: Internal Medicine | Admitting: Internal Medicine

## 2015-05-29 DIAGNOSIS — Z8673 Personal history of transient ischemic attack (TIA), and cerebral infarction without residual deficits: Secondary | ICD-10-CM | POA: Diagnosis not present

## 2015-05-29 DIAGNOSIS — N390 Urinary tract infection, site not specified: Secondary | ICD-10-CM | POA: Insufficient documentation

## 2015-05-29 DIAGNOSIS — J984 Other disorders of lung: Secondary | ICD-10-CM | POA: Diagnosis not present

## 2015-05-29 DIAGNOSIS — Z452 Encounter for adjustment and management of vascular access device: Secondary | ICD-10-CM | POA: Diagnosis not present

## 2015-05-29 DIAGNOSIS — Z7901 Long term (current) use of anticoagulants: Secondary | ICD-10-CM | POA: Diagnosis not present

## 2015-05-29 MED ORDER — SODIUM CHLORIDE 0.9 % IJ SOLN: 10.0000 mL | INTRAMUSCULAR | Status: DC | PRN

## 2015-05-29 MED ORDER — SODIUM CHLORIDE 0.9 % IJ SOLN
10.0000 mL | Freq: Two times a day (BID) | INTRAMUSCULAR | Status: DC
Start: 2015-05-29 — End: 2015-05-30

## 2015-05-29 NOTE — Progress Notes (Signed)
Peripherally Inserted Central Catheter/Midline Placement  The IV Nurse has discussed with the patient and/or persons authorized to consent for the patient, the purpose of this procedure and the potential benefits and risks involved with this procedure.  The benefits include less needle sticks, lab draws from the catheter and patient may be discharged home with the catheter.  Risks include, but not limited to, infection, bleeding, blood clot (thrombus formation), and puncture of an artery; nerve damage and irregular heat beat.  Alternatives to this procedure were also discussed.  PICC/Midline Placement Documentation  PICC / Midline Single Lumen 34/28/76 PICC Right Basilic 48 cm 2 cm (Active)  Indication for Insertion or Continuance of Line Home intravenous therapies (PICC only);Administration of hyperosmolar/irritating solutions (i.e. TPN, Vancomycin, etc.) 05/29/2015  5:00 PM  Exposed Catheter (cm) 2 cm 05/29/2015  5:00 PM  Site Assessment Clean;Dry;Intact 05/29/2015  5:00 PM  Line Status Blood return noted;Capped (central line);Flushed 05/29/2015  5:00 PM  Dressing Type Transparent;Securing device 05/29/2015  5:00 PM  Dressing Status Clean;Dry;Intact;Antimicrobial disc in place 05/29/2015  5:00 PM  Line Care Connections checked and tightened 05/29/2015  5:00 PM  Line Adjustment (NICU/IV Team Only) No 05/29/2015  5:00 PM  Dressing Intervention New dressing 05/29/2015  5:00 PM  Dressing Change Due 06/05/15 05/29/2015  5:00 PM       Charm Barges S 05/29/2015, 5:11 PM

## 2015-05-29 NOTE — Discharge Instructions (Signed)
PICC Insertion, Care After Refer to this sheet in the next few weeks. These instructions provide you with information on caring for yourself after your procedure. Your health care provider may also give you more specific instructions. Your treatment has been planned according to current medical practices, but problems sometimes occur. Call your health care provider if you have any problems or questions after your procedure. WHAT TO EXPECT AFTER THE PROCEDURE After your procedure, it is typical to have the following:  Mild discomfort at the insertion site. This should not last more than a day. HOME CARE INSTRUCTIONS  Rest at home for the remainder of the day after the procedure.  You may bend your arm and move it freely. If your PICC is near or at the bend of your elbow, avoid activity with repeated motion at the elbow.  Avoid lifting heavy objects as instructed by your health care provider.  Avoid using a crutch with the arm on the same side as your PICC. You may need to use a walker. Bandage Care  Keep your PICC bandage (dressing) clean and dry to prevent infection.  Ask your health care provider when you may shower. To keep the dressing dry, cover the PICC with plastic wrap and tape before showering. If the dressing does become wet, replace it right after the shower.  Do not soak in the bath, swim, or use hot tubs when you have a PICC.  Change the PICC dressing as instructed by your health care provider.  Change your PICC dressing if it becomes loose or wet. General PICC Care  Check the PICC insertion site daily for leakage, redness, swelling, or pain.  Flush the PICC as directed by your health care provider. Let your health care provider know right away if the PICC is difficult to flush or does not flush. Do not use force to flush the PICC.  Do not use a syringe that is less than 10 mL to flush the PICC.  Never pull or tug on the PICC.  Avoid blood pressure checks on the arm  with the PICC.  Keep your PICC identification card with you at all times.  Do not take the PICC out yourself. Only a trained health care professional should remove the PICC. SEEK MEDICAL CARE IF:  You have pain in your arm, ear, face, or teeth.  You have fever or chills.  You have drainage from the PICC insertion site.  You have redness or palpate a "cord" around the PICC insertion site.  You cannot flush the catheter. SEEK IMMEDIATE MEDICAL CARE IF:  You have swelling in the arm in which the PICC is inserted. Document Released: 07/21/2013 Document Revised: 10/05/2013 Document Reviewed: 07/21/2013 [facility] Patient Information 2015 [place], [place]. This information is not intended to replace advice given to you by your health care provider. Make sure you discuss any questions you have with your health care provider. PICC Home Guide A peripherally inserted central catheter (PICC) is a long, thin, flexible tube that is inserted into a vein in the upper arm. It is a form of intravenous (IV) access. It is considered to be a "central" line because the tip of the PICC ends in a large vein in your chest. This large vein is called the superior vena cava (SVC). The PICC tip ends in the SVC because there is a lot of blood flow in the SVC. This allows medicines and IV fluids to be quickly distributed throughout the body. The PICC is inserted using a  sterile technique by a specially trained nurse or physician. After the PICC is inserted, a chest X-ray exam is done to be sure it is in the correct place.  A PICC may be placed for different reasons, such as:  To give medicines and liquid nutrition that can only be given through a central line. Examples are:  Certain antibiotic treatments.  Chemotherapy.  Total parenteral nutrition (TPN).  To take frequent blood samples.  To give IV fluids and blood products.  If there is difficulty placing a peripheral intravenous (PIV) catheter. If taken care  of properly, a PICC can remain in place for several months. A PICC can also allow a person to go home from the hospital early. Medicine and PICC care can be managed at home by a family member or home health care team. [place] A PICC? Problems with a PICC can occasionally occur. These may include the following:  A blood clot (thrombus) forming in or at the tip of the PICC. This can cause the PICC to become clogged. A clot-dissolving medicine called tissue plasminogen activator (tPA) can be given through the PICC to help break up the clot.  Inflammation of the vein (phlebitis) in which the PICC is placed. Signs of inflammation may include redness, pain at the insertion site, red streaks, or being able to feel a "cord" in the vein where the PICC is located.  Infection in the PICC or at the insertion site. Signs of infection may include fever, chills, redness, swelling, or pus drainage from the PICC insertion site.  PICC movement (malposition). The PICC tip may move from its original position due to excessive physical activity, forceful coughing, sneezing, or vomiting.  A break or cut in the PICC. It is important to not use scissors near the PICC.  Nerve or tendon irritation or injury during PICC insertion. WHAT SHOULD I KEEP IN MIND ABOUT ACTIVITIES WHEN I HAVE A PICC?  You may bend your arm and move it freely. If your PICC is near or at the bend of your elbow, avoid activity with repeated motion at the elbow.  Rest at home for the remainder of the day following PICC line insertion.  Avoid lifting heavy objects as instructed by your health care provider.  Avoid using a crutch with the arm on the same side as your PICC. You may need to use a walker. WHAT SHOULD I KNOW ABOUT MY PICC DRESSING?  Keep your PICC bandage (dressing) clean and dry to prevent infection.  Ask your health care provider when you may shower. Ask your health care provider to teach you how to  wrap the PICC when you do take a shower.  Change the PICC dressing as instructed by your health care provider.  Change your PICC dressing if it becomes loose or wet. WHAT SHOULD I KNOW ABOUT PICC CARE?  Check the PICC insertion site daily for leakage, redness, swelling, or pain.  Do not take a bath, swim, or use hot tubs when you have a PICC. Cover PICC line with clear plastic wrap and tape to keep it dry while showering.  Flush the PICC as directed by your health care provider. Let your health care provider know right away if the PICC is difficult to flush or does not flush. Do not use force to flush the PICC.  Do not use a syringe that is less than 10 mL to flush the PICC.  Never pull or tug on the PICC.  Avoid blood pressure checks on the arm with the PICC.  Keep your PICC identification card with you at all times.  Do not take the PICC out yourself. Only a trained clinical professional should remove the PICC. SEEK IMMEDIATE MEDICAL CARE IF:  Your PICC is accidentally pulled all the way out. If this happens, cover the insertion site with a bandage or gauze dressing. Do not throw the PICC away. Your health care provider will need to inspect it.  Your PICC was tugged or pulled and has partially come out. Do not  push the PICC back in.  There is any type of drainage, redness, or swelling where the PICC enters the skin.  You cannot flush the PICC, it is difficult to flush, or the PICC leaks around the insertion site when it is flushed.  You hear a "flushing" sound when the PICC is flushed.  You have pain, discomfort, or numbness in your arm, shoulder, or jaw on the same side as the PICC.  You feel your heart "racing" or skipping beats.  You notice a hole or tear in the PICC.  You develop chills or a fever. MAKE SURE YOU:   Understand these instructions.  Will watch your condition.  Will get help right away if you are not doing well or get worse. Document Released:  04/06/2003 Document Revised: 02/14/2014 Document Reviewed: 06/07/2013 [facility] Patient Information 2015 [place], [place]. This information is not intended to replace advice given to you by your health care provider. Make sure you discuss any questions you have with your health care provider. Peripherally Inserted Central Catheter/Midline Placement  The IV Nurse has discussed with the patient and/or persons authorized to consent for the patient, the purpose of this procedure and the potential benefits and risks involved with this procedure.  The benefits include less needle sticks, lab draws from the catheter and patient may be discharged home with the catheter.  Risks include, but not limited to, infection, bleeding, blood clot (thrombus formation), and puncture of an artery; nerve damage and irregular heat beat.  Alternatives to this procedure were also discussed.  PICC/Midline Placement Documentation  PICC / Midline Single Lumen 25/05/39 PICC Right Basilic 48 cm 2 cm (Active)  Indication for Insertion or Continuance of Line Home intravenous therapies (PICC only);Administration of hyperosmolar/irritating solutions (i.e. TPN, Vancomycin, etc.) 05/29/2015  5:00 PM  Exposed Catheter (cm) 2 cm 05/29/2015  5:00 PM  Site Assessment Clean;Dry;Intact 05/29/2015  5:00 PM  Line Status Blood return noted;Capped (central line);Flushed 05/29/2015  5:00 PM  Dressing Type Transparent;Securing device 05/29/2015  5:00 PM  Dressing Status Clean;Dry;Intact;Antimicrobial disc in place 05/29/2015  5:00 PM  Line Care Connections checked and tightened 05/29/2015  5:00 PM  Line Adjustment (NICU/IV Team Only) No 05/29/2015  5:00 PM  Dressing Intervention New dressing 05/29/2015  5:00 PM  Dressing Change Due 06/05/15 05/29/2015  5:00 PM       Charm Barges S 05/29/2015, 5:12 PM

## 2015-05-31 DIAGNOSIS — I5030 Unspecified diastolic (congestive) heart failure: Secondary | ICD-10-CM | POA: Diagnosis not present

## 2015-05-31 DIAGNOSIS — E785 Hyperlipidemia, unspecified: Secondary | ICD-10-CM | POA: Diagnosis not present

## 2015-05-31 DIAGNOSIS — Z79899 Other long term (current) drug therapy: Secondary | ICD-10-CM | POA: Diagnosis not present

## 2015-05-31 DIAGNOSIS — B351 Tinea unguium: Secondary | ICD-10-CM | POA: Diagnosis not present

## 2015-05-31 DIAGNOSIS — D649 Anemia, unspecified: Secondary | ICD-10-CM | POA: Diagnosis not present

## 2015-05-31 DIAGNOSIS — M79674 Pain in right toe(s): Secondary | ICD-10-CM | POA: Diagnosis not present

## 2015-05-31 DIAGNOSIS — M79675 Pain in left toe(s): Secondary | ICD-10-CM | POA: Diagnosis not present

## 2015-05-31 DIAGNOSIS — Z8673 Personal history of transient ischemic attack (TIA), and cerebral infarction without residual deficits: Secondary | ICD-10-CM | POA: Diagnosis not present

## 2015-05-31 DIAGNOSIS — N390 Urinary tract infection, site not specified: Secondary | ICD-10-CM | POA: Diagnosis not present

## 2015-05-31 DIAGNOSIS — Z7901 Long term (current) use of anticoagulants: Secondary | ICD-10-CM | POA: Diagnosis not present

## 2015-06-03 DIAGNOSIS — Z8673 Personal history of transient ischemic attack (TIA), and cerebral infarction without residual deficits: Secondary | ICD-10-CM | POA: Diagnosis not present

## 2015-06-03 DIAGNOSIS — N390 Urinary tract infection, site not specified: Secondary | ICD-10-CM | POA: Diagnosis not present

## 2015-06-03 DIAGNOSIS — Z7901 Long term (current) use of anticoagulants: Secondary | ICD-10-CM | POA: Diagnosis not present

## 2015-06-03 DIAGNOSIS — D649 Anemia, unspecified: Secondary | ICD-10-CM | POA: Diagnosis not present

## 2015-06-06 DIAGNOSIS — Z7901 Long term (current) use of anticoagulants: Secondary | ICD-10-CM | POA: Diagnosis not present

## 2015-06-06 DIAGNOSIS — Z8673 Personal history of transient ischemic attack (TIA), and cerebral infarction without residual deficits: Secondary | ICD-10-CM | POA: Diagnosis not present

## 2015-06-06 DIAGNOSIS — I5030 Unspecified diastolic (congestive) heart failure: Secondary | ICD-10-CM | POA: Diagnosis not present

## 2015-06-06 DIAGNOSIS — N390 Urinary tract infection, site not specified: Secondary | ICD-10-CM | POA: Diagnosis not present

## 2015-06-09 DIAGNOSIS — I5030 Unspecified diastolic (congestive) heart failure: Secondary | ICD-10-CM | POA: Diagnosis not present

## 2015-06-09 DIAGNOSIS — Z7901 Long term (current) use of anticoagulants: Secondary | ICD-10-CM | POA: Diagnosis not present

## 2015-06-09 DIAGNOSIS — Z8673 Personal history of transient ischemic attack (TIA), and cerebral infarction without residual deficits: Secondary | ICD-10-CM | POA: Diagnosis not present

## 2015-06-09 DIAGNOSIS — N390 Urinary tract infection, site not specified: Secondary | ICD-10-CM | POA: Diagnosis not present

## 2015-06-13 DIAGNOSIS — F411 Generalized anxiety disorder: Secondary | ICD-10-CM | POA: Diagnosis not present

## 2015-06-13 DIAGNOSIS — F332 Major depressive disorder, recurrent severe without psychotic features: Secondary | ICD-10-CM | POA: Diagnosis not present

## 2015-06-14 DIAGNOSIS — Z8673 Personal history of transient ischemic attack (TIA), and cerebral infarction without residual deficits: Secondary | ICD-10-CM | POA: Diagnosis not present

## 2015-06-14 DIAGNOSIS — N390 Urinary tract infection, site not specified: Secondary | ICD-10-CM | POA: Diagnosis not present

## 2015-06-14 DIAGNOSIS — Z7901 Long term (current) use of anticoagulants: Secondary | ICD-10-CM | POA: Diagnosis not present

## 2015-06-17 DIAGNOSIS — Z8673 Personal history of transient ischemic attack (TIA), and cerebral infarction without residual deficits: Secondary | ICD-10-CM | POA: Diagnosis not present

## 2015-06-17 DIAGNOSIS — Z7901 Long term (current) use of anticoagulants: Secondary | ICD-10-CM | POA: Diagnosis not present

## 2015-06-20 DIAGNOSIS — F411 Generalized anxiety disorder: Secondary | ICD-10-CM | POA: Diagnosis not present

## 2015-06-20 DIAGNOSIS — F332 Major depressive disorder, recurrent severe without psychotic features: Secondary | ICD-10-CM | POA: Diagnosis not present

## 2015-06-23 DIAGNOSIS — Z7901 Long term (current) use of anticoagulants: Secondary | ICD-10-CM | POA: Diagnosis not present

## 2015-06-23 DIAGNOSIS — Z8673 Personal history of transient ischemic attack (TIA), and cerebral infarction without residual deficits: Secondary | ICD-10-CM | POA: Diagnosis not present

## 2015-06-30 DIAGNOSIS — E039 Hypothyroidism, unspecified: Secondary | ICD-10-CM | POA: Diagnosis not present

## 2015-06-30 DIAGNOSIS — R300 Dysuria: Secondary | ICD-10-CM | POA: Diagnosis not present

## 2015-06-30 DIAGNOSIS — D649 Anemia, unspecified: Secondary | ICD-10-CM | POA: Diagnosis not present

## 2015-06-30 DIAGNOSIS — Z79899 Other long term (current) drug therapy: Secondary | ICD-10-CM | POA: Diagnosis not present

## 2015-06-30 DIAGNOSIS — Z7901 Long term (current) use of anticoagulants: Secondary | ICD-10-CM | POA: Diagnosis not present

## 2015-06-30 DIAGNOSIS — Z8673 Personal history of transient ischemic attack (TIA), and cerebral infarction without residual deficits: Secondary | ICD-10-CM | POA: Diagnosis not present

## 2015-06-30 DIAGNOSIS — E785 Hyperlipidemia, unspecified: Secondary | ICD-10-CM | POA: Diagnosis not present

## 2015-07-03 DIAGNOSIS — D509 Iron deficiency anemia, unspecified: Secondary | ICD-10-CM | POA: Diagnosis not present

## 2015-07-04 DIAGNOSIS — F411 Generalized anxiety disorder: Secondary | ICD-10-CM | POA: Diagnosis not present

## 2015-07-04 DIAGNOSIS — F332 Major depressive disorder, recurrent severe without psychotic features: Secondary | ICD-10-CM | POA: Diagnosis not present

## 2015-07-07 DIAGNOSIS — I5030 Unspecified diastolic (congestive) heart failure: Secondary | ICD-10-CM | POA: Diagnosis not present

## 2015-07-07 DIAGNOSIS — Z8673 Personal history of transient ischemic attack (TIA), and cerebral infarction without residual deficits: Secondary | ICD-10-CM | POA: Diagnosis not present

## 2015-07-07 DIAGNOSIS — Z7901 Long term (current) use of anticoagulants: Secondary | ICD-10-CM | POA: Diagnosis not present

## 2015-07-11 DIAGNOSIS — F411 Generalized anxiety disorder: Secondary | ICD-10-CM | POA: Diagnosis not present

## 2015-07-11 DIAGNOSIS — F332 Major depressive disorder, recurrent severe without psychotic features: Secondary | ICD-10-CM | POA: Diagnosis not present

## 2015-07-14 DIAGNOSIS — Z8673 Personal history of transient ischemic attack (TIA), and cerebral infarction without residual deficits: Secondary | ICD-10-CM | POA: Diagnosis not present

## 2015-07-14 DIAGNOSIS — Z7901 Long term (current) use of anticoagulants: Secondary | ICD-10-CM | POA: Diagnosis not present

## 2015-07-18 ENCOUNTER — Ambulatory Visit (INDEPENDENT_AMBULATORY_CARE_PROVIDER_SITE_OTHER): Payer: Medicare Other | Admitting: Cardiology

## 2015-07-18 ENCOUNTER — Encounter: Payer: Self-pay | Admitting: Cardiology

## 2015-07-18 VITALS — BP 126/74 | HR 98 | Ht 66.0 in | Wt 280.1 lb

## 2015-07-18 DIAGNOSIS — I639 Cerebral infarction, unspecified: Secondary | ICD-10-CM

## 2015-07-18 DIAGNOSIS — I2510 Atherosclerotic heart disease of native coronary artery without angina pectoris: Secondary | ICD-10-CM | POA: Diagnosis not present

## 2015-07-18 DIAGNOSIS — R0789 Other chest pain: Secondary | ICD-10-CM

## 2015-07-18 DIAGNOSIS — Z8673 Personal history of transient ischemic attack (TIA), and cerebral infarction without residual deficits: Secondary | ICD-10-CM | POA: Diagnosis not present

## 2015-07-18 DIAGNOSIS — F332 Major depressive disorder, recurrent severe without psychotic features: Secondary | ICD-10-CM | POA: Diagnosis not present

## 2015-07-18 DIAGNOSIS — I10 Essential (primary) hypertension: Secondary | ICD-10-CM | POA: Diagnosis not present

## 2015-07-18 DIAGNOSIS — F411 Generalized anxiety disorder: Secondary | ICD-10-CM | POA: Diagnosis not present

## 2015-07-18 MED ORDER — ISOSORBIDE MONONITRATE ER 30 MG PO TB24: 45.0000 mg | ORAL_TABLET | Freq: Every day | ORAL | Status: DC

## 2015-07-18 MED ORDER — ATORVASTATIN CALCIUM 80 MG PO TABS: 80.0000 mg | ORAL_TABLET | Freq: Every day | ORAL | Status: DC

## 2015-07-18 NOTE — Patient Instructions (Signed)
Your physician wants you to follow-up in: 6 months with Dr Bryna Colander will receive a reminder letter in the mail two months in advance. If you don't receive a letter, please call our office to schedule the follow-up appointment.   INCREASE Imdur to 45 mg (1 1/2 tablets) daily    START Atorvastatin 80 mg at dinner      Thank you for choosing [facility] !

## 2015-07-18 NOTE — Progress Notes (Signed)
Patient ID: Karen Mccann, female   DOB: Sep 09, 1951, 64 y.o.   MRN: [id number]     Clinical Summary Karen Mccann is a 64 y.o.female seen today for follow up of the following medical problems.   1. 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 She reports CAGB at [facility] in 2005. Karen Mccann MPI 05/2015 without clear ischemia, probable soft tissue attenuation. Overall low risk - still some occasional chest pain, approx 2-3 times a week.   2. History of CVA - appears she has been on coumadin since her stroke in 2005  No past medical history on file.   Allergies  Allergen Reactions  . Latex Other (See Comments)    unknown     Current Outpatient Prescriptions  Medication Sig Dispense Refill  . acetaminophen (TYLENOL) 325 MG tablet Take 650 mg by mouth every 4 (four) hours as needed.    Marland Kitchen acyclovir (ZOVIRAX) 400 MG tablet Take 400 mg by mouth 3 (three) times daily.    Marland Kitchen aspirin EC 81 MG tablet Take 81 mg by mouth daily.    . calcium-vitamin D (OSCAL WITH D) 500-200 MG-UNIT per tablet Take 1 tablet by mouth daily with breakfast.    . cetirizine (ZYRTEC) 10 MG tablet Take 10 mg by mouth daily.    . Cholecalciferol (VITAMIN D3) 3000 UNITS TABS Take 1,000 Units by mouth daily.    . DULoxetine (CYMBALTA) 60 MG capsule Take 60 mg by mouth at bedtime. 2 tabs hs    . esomeprazole (NEXIUM) 40 MG capsule Take 40 mg by mouth daily at 12 noon.    . fentaNYL (DURAGESIC - DOSED MCG/HR) 50 MCG/HR Place 50 mcg onto the skin every 3 (three) days.    . fluticasone (FLONASE) 50 MCG/ACT nasal spray Place 1 spray into both nostrils at bedtime.    . furosemide (LASIX) 20 MG tablet Take 20 mg by mouth.    . hydrOXYzine (ATARAX/VISTARIL) 50 MG tablet Take 50 mg by mouth at bedtime as needed.    Marland Kitchen ipratropium-albuterol (DUONEB) 0.5-2.5 (3) MG/3ML SOLN Take 3 mLs by nebulization 3 (three) times daily as needed.    . isosorbide mononitrate (IMDUR) 30 MG 24 hr tablet Take 30 mg by mouth daily.    Marland Kitchen  levothyroxine (SYNTHROID, LEVOTHROID) 100 MCG tablet Take 100 mcg by mouth daily before breakfast.    . metoprolol tartrate (LOPRESSOR) 25 MG tablet Take 1 tablet (25 mg total) by mouth 2 (two) times daily. 180 tablet 3  . Multiple Vitamins-Minerals (MULTIVITAMIN ADULT PO) Take 1 tablet by mouth daily.    . nitroGLYCERIN (NITROSTAT) 0.4 MG SL tablet Place 0.4 mg under the tongue every 5 (five) minutes as needed for chest pain.    . nortriptyline (PAMELOR) 50 MG capsule Take 50 mg by mouth at bedtime.    Marland Kitchen omeprazole (PRILOSEC) 20 MG capsule Take 20 mg by mouth daily.    . ondansetron (ZOFRAN) 4 MG tablet Take 4 mg by mouth every 8 (eight) hours as needed for nausea or vomiting.    . phenytoin (DILANTIN) 100 MG ER capsule Take 100 mg by mouth 2 (two) times daily. 2 tabs am,3 tabs at hs    . pregabalin (LYRICA) 75 MG capsule Take 75 mg by mouth daily.    Marland Kitchen Propylene Glycol (SYSTANE BALANCE) 0.6 % SOLN Apply 1 drop to eye daily. Both eyes    . warfarin (COUMADIN) 6 MG tablet Take 6 mg by mouth daily.  No current facility-administered medications for this visit.     No past surgical history on file.   Allergies  Allergen Reactions  . Latex Other (See Comments)    unknown      No family history on file.   Social History Karen Mccann reports that she quit smoking about 12 years ago. Her smoking use included Cigarettes. She smoked 0.50 packs per day. She has never used smokeless tobacco. Karen Mccann has no alcohol history on file.   Review of Systems CONSTITUTIONAL: No weight loss, fever, chills, weakness or fatigue.  HEENT: Eyes: No visual loss, blurred vision, double vision or yellow sclerae.No hearing loss, sneezing, congestion, runny nose or sore throat.  SKIN: No rash or itching.  CARDIOVASCULAR: per HPI RESPIRATORY: No shortness of breath, cough or sputum.  GASTROINTESTINAL: No anorexia, nausea, vomiting or diarrhea. No abdominal pain or blood.  GENITOURINARY: No burning on  urination, no polyuria NEUROLOGICAL: No headache, dizziness, syncope, paralysis, ataxia, numbness or tingling in the extremities. No change in bowel or bladder control.  MUSCULOSKELETAL: No muscle, back pain, joint pain or stiffness.  LYMPHATICS: No enlarged nodes. No history of splenectomy.  PSYCHIATRIC: No history of depression or anxiety.  ENDOCRINOLOGIC: No reports of sweating, cold or heat intolerance. No polyuria or polydipsia.  Marland Kitchen   Physical Examination Filed Vitals:   07/18/15 0952  BP: 126/74  Pulse: 98   Filed Vitals:   07/18/15 0952  Height: 5\' 6"  (1.676 m)  Weight: 280 lb 1.6 oz (127.053 kg)    Gen: resting comfortably, no acute distress HEENT: no scleral icterus, pupils equal round and reactive, no palptable cervical adenopathy,  CV: RRR, no m/r/g, nojvd Resp: Clear to auscultation bilaterally GI: abdomen is soft, non-tender, non-distended, normal bowel sounds, no hepatosplenomegaly MSK: extremities are warm, no edema.  Skin: warm, no rash Neuro:  no focal deficits Psych: appropriate affect   Diagnostic Studies 05/2015 Lexiscan MPI  There was no ST segment deviation noted during stress.  Defect 1: There is a small defect of mild severity present in the basal inferolateral, mid inferolateral, apical inferior and apical lateral location. As regional wall motion was grossly normal, this appeared to be most consistent with soft tissue attenuation artifact.  This is a low risk study.  Nuclear stress EF: 56%.   Assessment and Plan   1. CAD - recent episodes of chest pain. Lexiscan low risk, probable soft tissue attenuation as opposed to ischemia - still with occasional symptoms, will increase imdur to 45mg  daily and follow symptoms - change to high dose statin in setting of known CAD  F/u 6 months      Arnoldo Lenis, M.D.

## 2015-07-21 DIAGNOSIS — I2510 Atherosclerotic heart disease of native coronary artery without angina pectoris: Secondary | ICD-10-CM | POA: Diagnosis not present

## 2015-07-21 DIAGNOSIS — Z8673 Personal history of transient ischemic attack (TIA), and cerebral infarction without residual deficits: Secondary | ICD-10-CM | POA: Diagnosis not present

## 2015-07-21 DIAGNOSIS — Z7901 Long term (current) use of anticoagulants: Secondary | ICD-10-CM | POA: Diagnosis not present

## 2015-07-28 DIAGNOSIS — Z7901 Long term (current) use of anticoagulants: Secondary | ICD-10-CM | POA: Diagnosis not present

## 2015-07-31 DIAGNOSIS — D509 Iron deficiency anemia, unspecified: Secondary | ICD-10-CM | POA: Diagnosis not present

## 2015-07-31 DIAGNOSIS — Z8673 Personal history of transient ischemic attack (TIA), and cerebral infarction without residual deficits: Secondary | ICD-10-CM | POA: Diagnosis not present

## 2015-07-31 DIAGNOSIS — Z7901 Long term (current) use of anticoagulants: Secondary | ICD-10-CM | POA: Diagnosis not present

## 2015-07-31 DIAGNOSIS — I2510 Atherosclerotic heart disease of native coronary artery without angina pectoris: Secondary | ICD-10-CM | POA: Diagnosis not present

## 2015-07-31 DIAGNOSIS — Z79899 Other long term (current) drug therapy: Secondary | ICD-10-CM | POA: Diagnosis not present

## 2015-07-31 DIAGNOSIS — N182 Chronic kidney disease, stage 2 (mild): Secondary | ICD-10-CM | POA: Diagnosis not present

## 2015-07-31 DIAGNOSIS — I5030 Unspecified diastolic (congestive) heart failure: Secondary | ICD-10-CM | POA: Diagnosis not present

## 2015-07-31 DIAGNOSIS — D649 Anemia, unspecified: Secondary | ICD-10-CM | POA: Diagnosis not present

## 2015-08-01 DIAGNOSIS — F411 Generalized anxiety disorder: Secondary | ICD-10-CM | POA: Diagnosis not present

## 2015-08-01 DIAGNOSIS — F332 Major depressive disorder, recurrent severe without psychotic features: Secondary | ICD-10-CM | POA: Diagnosis not present

## 2015-08-08 DIAGNOSIS — Z7901 Long term (current) use of anticoagulants: Secondary | ICD-10-CM | POA: Diagnosis not present

## 2015-08-08 DIAGNOSIS — Z8673 Personal history of transient ischemic attack (TIA), and cerebral infarction without residual deficits: Secondary | ICD-10-CM | POA: Diagnosis not present

## 2015-08-13 DIAGNOSIS — R251 Tremor, unspecified: Secondary | ICD-10-CM | POA: Diagnosis not present

## 2015-08-14 DIAGNOSIS — D649 Anemia, unspecified: Secondary | ICD-10-CM | POA: Diagnosis not present

## 2015-08-14 DIAGNOSIS — D509 Iron deficiency anemia, unspecified: Secondary | ICD-10-CM | POA: Diagnosis not present

## 2015-08-14 DIAGNOSIS — E039 Hypothyroidism, unspecified: Secondary | ICD-10-CM | POA: Diagnosis not present

## 2015-08-14 DIAGNOSIS — N182 Chronic kidney disease, stage 2 (mild): Secondary | ICD-10-CM | POA: Diagnosis not present

## 2015-08-14 DIAGNOSIS — I5030 Unspecified diastolic (congestive) heart failure: Secondary | ICD-10-CM | POA: Diagnosis not present

## 2015-08-14 DIAGNOSIS — F332 Major depressive disorder, recurrent severe without psychotic features: Secondary | ICD-10-CM | POA: Diagnosis not present

## 2015-08-14 DIAGNOSIS — G40909 Epilepsy, unspecified, not intractable, without status epilepticus: Secondary | ICD-10-CM | POA: Diagnosis not present

## 2015-08-14 DIAGNOSIS — F411 Generalized anxiety disorder: Secondary | ICD-10-CM | POA: Diagnosis not present

## 2015-08-15 DIAGNOSIS — Z8673 Personal history of transient ischemic attack (TIA), and cerebral infarction without residual deficits: Secondary | ICD-10-CM | POA: Diagnosis not present

## 2015-08-15 DIAGNOSIS — I4891 Unspecified atrial fibrillation: Secondary | ICD-10-CM | POA: Diagnosis not present

## 2015-08-16 DIAGNOSIS — B351 Tinea unguium: Secondary | ICD-10-CM | POA: Diagnosis not present

## 2015-08-16 DIAGNOSIS — M79674 Pain in right toe(s): Secondary | ICD-10-CM | POA: Diagnosis not present

## 2015-08-16 DIAGNOSIS — M79675 Pain in left toe(s): Secondary | ICD-10-CM | POA: Diagnosis not present

## 2015-08-22 DIAGNOSIS — I4891 Unspecified atrial fibrillation: Secondary | ICD-10-CM | POA: Diagnosis not present

## 2015-08-22 DIAGNOSIS — Z8673 Personal history of transient ischemic attack (TIA), and cerebral infarction without residual deficits: Secondary | ICD-10-CM | POA: Diagnosis not present

## 2015-08-22 DIAGNOSIS — Z7901 Long term (current) use of anticoagulants: Secondary | ICD-10-CM | POA: Diagnosis not present

## 2015-08-28 DIAGNOSIS — G40909 Epilepsy, unspecified, not intractable, without status epilepticus: Secondary | ICD-10-CM | POA: Diagnosis not present

## 2015-08-28 DIAGNOSIS — N182 Chronic kidney disease, stage 2 (mild): Secondary | ICD-10-CM | POA: Diagnosis not present

## 2015-08-28 DIAGNOSIS — I2510 Atherosclerotic heart disease of native coronary artery without angina pectoris: Secondary | ICD-10-CM | POA: Diagnosis not present

## 2015-08-28 DIAGNOSIS — E039 Hypothyroidism, unspecified: Secondary | ICD-10-CM | POA: Diagnosis not present

## 2015-08-28 DIAGNOSIS — F411 Generalized anxiety disorder: Secondary | ICD-10-CM | POA: Diagnosis not present

## 2015-08-28 DIAGNOSIS — F332 Major depressive disorder, recurrent severe without psychotic features: Secondary | ICD-10-CM | POA: Diagnosis not present

## 2015-08-28 DIAGNOSIS — D649 Anemia, unspecified: Secondary | ICD-10-CM | POA: Diagnosis not present

## 2015-08-28 DIAGNOSIS — I509 Heart failure, unspecified: Secondary | ICD-10-CM | POA: Diagnosis not present

## 2015-08-28 LAB — TSH: TSH: 17.04 u[IU]/mL — AB (ref 0.41–5.90)

## 2015-08-29 DIAGNOSIS — N182 Chronic kidney disease, stage 2 (mild): Secondary | ICD-10-CM | POA: Diagnosis not present

## 2015-08-29 DIAGNOSIS — I509 Heart failure, unspecified: Secondary | ICD-10-CM | POA: Diagnosis not present

## 2015-08-29 DIAGNOSIS — D649 Anemia, unspecified: Secondary | ICD-10-CM | POA: Diagnosis not present

## 2015-08-29 DIAGNOSIS — E039 Hypothyroidism, unspecified: Secondary | ICD-10-CM | POA: Diagnosis not present

## 2015-08-29 DIAGNOSIS — G40909 Epilepsy, unspecified, not intractable, without status epilepticus: Secondary | ICD-10-CM | POA: Diagnosis not present

## 2015-08-30 DIAGNOSIS — I4891 Unspecified atrial fibrillation: Secondary | ICD-10-CM | POA: Diagnosis not present

## 2015-08-30 DIAGNOSIS — Z7901 Long term (current) use of anticoagulants: Secondary | ICD-10-CM | POA: Diagnosis not present

## 2015-08-30 DIAGNOSIS — Z8673 Personal history of transient ischemic attack (TIA), and cerebral infarction without residual deficits: Secondary | ICD-10-CM | POA: Diagnosis not present

## 2015-08-31 DIAGNOSIS — R32 Unspecified urinary incontinence: Secondary | ICD-10-CM | POA: Diagnosis not present

## 2015-08-31 DIAGNOSIS — Z8673 Personal history of transient ischemic attack (TIA), and cerebral infarction without residual deficits: Secondary | ICD-10-CM | POA: Diagnosis not present

## 2015-09-01 DIAGNOSIS — I2510 Atherosclerotic heart disease of native coronary artery without angina pectoris: Secondary | ICD-10-CM | POA: Diagnosis not present

## 2015-09-01 DIAGNOSIS — I4891 Unspecified atrial fibrillation: Secondary | ICD-10-CM | POA: Diagnosis not present

## 2015-09-04 DIAGNOSIS — Z7901 Long term (current) use of anticoagulants: Secondary | ICD-10-CM | POA: Diagnosis not present

## 2015-09-04 DIAGNOSIS — I4891 Unspecified atrial fibrillation: Secondary | ICD-10-CM | POA: Diagnosis not present

## 2015-09-04 DIAGNOSIS — Z8673 Personal history of transient ischemic attack (TIA), and cerebral infarction without residual deficits: Secondary | ICD-10-CM | POA: Diagnosis not present

## 2015-09-06 DIAGNOSIS — F411 Generalized anxiety disorder: Secondary | ICD-10-CM | POA: Diagnosis not present

## 2015-09-06 DIAGNOSIS — F332 Major depressive disorder, recurrent severe without psychotic features: Secondary | ICD-10-CM | POA: Diagnosis not present

## 2015-09-08 DIAGNOSIS — Z7901 Long term (current) use of anticoagulants: Secondary | ICD-10-CM | POA: Diagnosis not present

## 2015-09-08 DIAGNOSIS — Z8673 Personal history of transient ischemic attack (TIA), and cerebral infarction without residual deficits: Secondary | ICD-10-CM | POA: Diagnosis not present

## 2015-09-08 DIAGNOSIS — I4891 Unspecified atrial fibrillation: Secondary | ICD-10-CM | POA: Diagnosis not present

## 2015-09-08 DIAGNOSIS — I509 Heart failure, unspecified: Secondary | ICD-10-CM | POA: Diagnosis not present

## 2015-09-10 DIAGNOSIS — Z7901 Long term (current) use of anticoagulants: Secondary | ICD-10-CM | POA: Diagnosis not present

## 2015-09-10 DIAGNOSIS — Z8673 Personal history of transient ischemic attack (TIA), and cerebral infarction without residual deficits: Secondary | ICD-10-CM | POA: Diagnosis not present

## 2015-09-12 DIAGNOSIS — F332 Major depressive disorder, recurrent severe without psychotic features: Secondary | ICD-10-CM | POA: Diagnosis not present

## 2015-09-12 DIAGNOSIS — F411 Generalized anxiety disorder: Secondary | ICD-10-CM | POA: Diagnosis not present

## 2015-09-13 DIAGNOSIS — R2681 Unsteadiness on feet: Secondary | ICD-10-CM | POA: Diagnosis not present

## 2015-09-13 DIAGNOSIS — I509 Heart failure, unspecified: Secondary | ICD-10-CM | POA: Diagnosis not present

## 2015-09-14 DIAGNOSIS — I509 Heart failure, unspecified: Secondary | ICD-10-CM | POA: Diagnosis not present

## 2015-09-14 DIAGNOSIS — R2681 Unsteadiness on feet: Secondary | ICD-10-CM | POA: Diagnosis not present

## 2015-09-15 DIAGNOSIS — I509 Heart failure, unspecified: Secondary | ICD-10-CM | POA: Diagnosis not present

## 2015-09-15 DIAGNOSIS — R2681 Unsteadiness on feet: Secondary | ICD-10-CM | POA: Diagnosis not present

## 2015-09-16 DIAGNOSIS — R05 Cough: Secondary | ICD-10-CM | POA: Diagnosis not present

## 2015-09-17 ENCOUNTER — Inpatient Hospital Stay (HOSPITAL_COMMUNITY)
Admission: EM | Admit: 2015-09-17 | Discharge: 2015-09-20 | DRG: 872 | Disposition: A | Discharge status: Skilled Nursing Facility | Payer: Medicare Other | Attending: Family Medicine | Admitting: Family Medicine

## 2015-09-17 ENCOUNTER — Emergency Department (HOSPITAL_COMMUNITY): Payer: Medicare Other

## 2015-09-17 ENCOUNTER — Encounter (HOSPITAL_COMMUNITY): Payer: Self-pay

## 2015-09-17 DIAGNOSIS — T7840XA Allergy, unspecified, initial encounter: Secondary | ICD-10-CM | POA: Diagnosis not present

## 2015-09-17 DIAGNOSIS — R293 Abnormal posture: Secondary | ICD-10-CM | POA: Diagnosis not present

## 2015-09-17 DIAGNOSIS — K219 Gastro-esophageal reflux disease without esophagitis: Secondary | ICD-10-CM | POA: Diagnosis not present

## 2015-09-17 DIAGNOSIS — G8220 Paraplegia, unspecified: Secondary | ICD-10-CM | POA: Diagnosis present

## 2015-09-17 DIAGNOSIS — A4151 Sepsis due to Escherichia coli [E. coli]: Principal | ICD-10-CM | POA: Diagnosis present

## 2015-09-17 DIAGNOSIS — F411 Generalized anxiety disorder: Secondary | ICD-10-CM | POA: Diagnosis not present

## 2015-09-17 DIAGNOSIS — R2681 Unsteadiness on feet: Secondary | ICD-10-CM | POA: Diagnosis not present

## 2015-09-17 DIAGNOSIS — I25810 Atherosclerosis of coronary artery bypass graft(s) without angina pectoris: Secondary | ICD-10-CM | POA: Diagnosis not present

## 2015-09-17 DIAGNOSIS — R569 Unspecified convulsions: Secondary | ICD-10-CM

## 2015-09-17 DIAGNOSIS — B9620 Unspecified Escherichia coli [E. coli] as the cause of diseases classified elsewhere: Secondary | ICD-10-CM | POA: Diagnosis not present

## 2015-09-17 DIAGNOSIS — G40909 Epilepsy, unspecified, not intractable, without status epilepticus: Secondary | ICD-10-CM | POA: Diagnosis present

## 2015-09-17 DIAGNOSIS — E039 Hypothyroidism, unspecified: Secondary | ICD-10-CM | POA: Diagnosis present

## 2015-09-17 DIAGNOSIS — J449 Chronic obstructive pulmonary disease, unspecified: Secondary | ICD-10-CM | POA: Diagnosis present

## 2015-09-17 DIAGNOSIS — Z1612 Extended spectrum beta lactamase (ESBL) resistance: Secondary | ICD-10-CM | POA: Diagnosis not present

## 2015-09-17 DIAGNOSIS — Z87891 Personal history of nicotine dependence: Secondary | ICD-10-CM | POA: Diagnosis not present

## 2015-09-17 DIAGNOSIS — I10 Essential (primary) hypertension: Secondary | ICD-10-CM | POA: Diagnosis not present

## 2015-09-17 DIAGNOSIS — Z452 Encounter for adjustment and management of vascular access device: Secondary | ICD-10-CM | POA: Diagnosis not present

## 2015-09-17 DIAGNOSIS — G8929 Other chronic pain: Secondary | ICD-10-CM | POA: Diagnosis not present

## 2015-09-17 DIAGNOSIS — F419 Anxiety disorder, unspecified: Secondary | ICD-10-CM | POA: Diagnosis not present

## 2015-09-17 DIAGNOSIS — I69354 Hemiplegia and hemiparesis following cerebral infarction affecting left non-dominant side: Secondary | ICD-10-CM

## 2015-09-17 DIAGNOSIS — E8809 Other disorders of plasma-protein metabolism, not elsewhere classified: Secondary | ICD-10-CM | POA: Diagnosis not present

## 2015-09-17 DIAGNOSIS — Z7901 Long term (current) use of anticoagulants: Secondary | ICD-10-CM | POA: Diagnosis not present

## 2015-09-17 DIAGNOSIS — Z951 Presence of aortocoronary bypass graft: Secondary | ICD-10-CM

## 2015-09-17 DIAGNOSIS — A419 Sepsis, unspecified organism: Secondary | ICD-10-CM

## 2015-09-17 DIAGNOSIS — A498 Other bacterial infections of unspecified site: Secondary | ICD-10-CM | POA: Diagnosis not present

## 2015-09-17 DIAGNOSIS — N390 Urinary tract infection, site not specified: Secondary | ICD-10-CM | POA: Diagnosis present

## 2015-09-17 DIAGNOSIS — F339 Major depressive disorder, recurrent, unspecified: Secondary | ICD-10-CM | POA: Diagnosis not present

## 2015-09-17 DIAGNOSIS — Z7982 Long term (current) use of aspirin: Secondary | ICD-10-CM | POA: Diagnosis not present

## 2015-09-17 DIAGNOSIS — F418 Other specified anxiety disorders: Secondary | ICD-10-CM | POA: Diagnosis not present

## 2015-09-17 DIAGNOSIS — F319 Bipolar disorder, unspecified: Secondary | ICD-10-CM | POA: Diagnosis present

## 2015-09-17 DIAGNOSIS — I69954 Hemiplegia and hemiparesis following unspecified cerebrovascular disease affecting left non-dominant side: Secondary | ICD-10-CM | POA: Diagnosis not present

## 2015-09-17 DIAGNOSIS — Z8679 Personal history of other diseases of the circulatory system: Secondary | ICD-10-CM | POA: Diagnosis not present

## 2015-09-17 DIAGNOSIS — Z7401 Bed confinement status: Secondary | ICD-10-CM | POA: Diagnosis not present

## 2015-09-17 DIAGNOSIS — M6281 Muscle weakness (generalized): Secondary | ICD-10-CM | POA: Diagnosis not present

## 2015-09-17 DIAGNOSIS — R41841 Cognitive communication deficit: Secondary | ICD-10-CM | POA: Diagnosis not present

## 2015-09-17 DIAGNOSIS — R509 Fever, unspecified: Secondary | ICD-10-CM | POA: Diagnosis not present

## 2015-09-17 DIAGNOSIS — E559 Vitamin D deficiency, unspecified: Secondary | ICD-10-CM | POA: Diagnosis not present

## 2015-09-17 DIAGNOSIS — Z9981 Dependence on supplemental oxygen: Secondary | ICD-10-CM | POA: Diagnosis not present

## 2015-09-17 DIAGNOSIS — I2510 Atherosclerotic heart disease of native coronary artery without angina pectoris: Secondary | ICD-10-CM | POA: Diagnosis present

## 2015-09-17 DIAGNOSIS — B009 Herpesviral infection, unspecified: Secondary | ICD-10-CM | POA: Diagnosis not present

## 2015-09-17 DIAGNOSIS — E785 Hyperlipidemia, unspecified: Secondary | ICD-10-CM | POA: Diagnosis not present

## 2015-09-17 HISTORY — DX: Bipolar disorder, unspecified: F31.9

## 2015-09-17 HISTORY — DX: Major depressive disorder, single episode, unspecified: F32.9

## 2015-09-17 HISTORY — DX: Cerebral infarction, unspecified: I63.9

## 2015-09-17 HISTORY — DX: Heart failure, unspecified: I50.9

## 2015-09-17 HISTORY — DX: Chronic obstructive pulmonary disease, unspecified: J44.9

## 2015-09-17 HISTORY — DX: Gastro-esophageal reflux disease without esophagitis: K21.9

## 2015-09-17 HISTORY — DX: Disorder of thyroid, unspecified: E07.9

## 2015-09-17 HISTORY — DX: Hemiplegia, unspecified affecting unspecified side: G81.90

## 2015-09-17 HISTORY — DX: Depression, unspecified: F32.A

## 2015-09-17 LAB — PROTIME-INR
INR: 3.58 — ABNORMAL HIGH (ref 0.00–1.49)
Prothrombin Time: 34.9 seconds — ABNORMAL HIGH (ref 11.6–15.2)

## 2015-09-17 LAB — URINE MICROSCOPIC-ADD ON

## 2015-09-17 LAB — COMPREHENSIVE METABOLIC PANEL
ALT: 24 U/L (ref 14–54)
AST: 25 U/L (ref 15–41)
Albumin: 2.8 g/dL — ABNORMAL LOW (ref 3.5–5.0)
Alkaline Phosphatase: 126 U/L (ref 38–126)
Anion gap: 7 (ref 5–15)
BILIRUBIN TOTAL: 0.5 mg/dL (ref 0.3–1.2)
BUN: 10 mg/dL (ref 6–20)
CO2: 31 mmol/L (ref 22–32)
Calcium: 8.8 mg/dL — ABNORMAL LOW (ref 8.9–10.3)
Chloride: 100 mmol/L — ABNORMAL LOW (ref 101–111)
Creatinine, Ser: 0.78 mg/dL (ref 0.44–1.00)
GFR calc Af Amer: >60 mL/min (ref >60)
Glucose, Bld: 128 mg/dL — ABNORMAL HIGH (ref 65–99)
Potassium: 4.1 mmol/L (ref 3.5–5.1)
Sodium: 138 mmol/L (ref 135–145)
TOTAL PROTEIN: 6.9 g/dL (ref 6.5–8.1)

## 2015-09-17 LAB — CBC WITH DIFFERENTIAL/PLATELET
BASOS ABS: 0 10*3/uL (ref 0.0–0.1)
BASOS PCT: 0 %
EOS ABS: 0.1 10*3/uL (ref 0.0–0.7)
EOS PCT: 1 %
HCT: 39.5 % (ref 36.0–46.0)
Hemoglobin: 12.7 g/dL (ref 12.0–15.0)
Lymphocytes Relative: 10 %
Lymphs Abs: 1.1 10*3/uL (ref 0.7–4.0)
MCH: 30.3 pg (ref 26.0–34.0)
MCHC: 32.2 g/dL (ref 30.0–36.0)
MCV: 94.3 fL (ref 78.0–100.0)
Monocytes Absolute: 1.7 10*3/uL — ABNORMAL HIGH (ref 0.1–1.0)
Monocytes Relative: 15 %
Neutro Abs: 8.6 10*3/uL — ABNORMAL HIGH (ref 1.7–7.7)
Neutrophils Relative %: 74 %
PLATELETS: 148 10*3/uL — AB (ref 150–400)
RBC: 4.19 MIL/uL (ref 3.87–5.11)
RDW: 21.3 % — ABNORMAL HIGH (ref 11.5–15.5)
WBC: 11.6 10*3/uL — AB (ref 4.0–10.5)

## 2015-09-17 LAB — URINALYSIS, ROUTINE W REFLEX MICROSCOPIC
Bilirubin Urine: NEGATIVE
Glucose, UA: NEGATIVE mg/dL
Ketones, ur: NEGATIVE mg/dL
Nitrite: NEGATIVE
Protein, ur: 100 mg/dL — AB
SPECIFIC GRAVITY, URINE: 1.02 (ref 1.005–1.030)
pH: 5.5 (ref 5.0–8.0)

## 2015-09-17 LAB — I-STAT CG4 LACTIC ACID, ED: Lactic Acid, Venous: 1.22 mmol/L (ref 0.5–2.0)

## 2015-09-17 LAB — PHENYTOIN LEVEL, TOTAL: Phenytoin Lvl: 9.1 ug/mL — ABNORMAL LOW (ref 10.0–20.0)

## 2015-09-17 LAB — INFLUENZA PANEL BY PCR (TYPE A & B)
H1N1 flu by pcr: NOT DETECTED
INFLAPCR: NEGATIVE
INFLBPCR: NEGATIVE

## 2015-09-17 LAB — MRSA PCR SCREENING: MRSA BY PCR: NEGATIVE

## 2015-09-17 MED ORDER — SODIUM CHLORIDE 0.9 % IV BOLUS (SEPSIS): 1000.0000 mL | Freq: Once | INTRAVENOUS | Status: DC

## 2015-09-17 MED ORDER — ACETAMINOPHEN 325 MG PO TABS
650.0000 mg | ORAL_TABLET | Freq: Four times a day (QID) | ORAL | Status: DC | PRN
  Administered 2015-09-17 – 2015-09-19 (×3): 650 mg via ORAL
  Filled 2015-09-17 (×3): qty 2

## 2015-09-17 MED ORDER — PHENYTOIN SODIUM EXTENDED 100 MG PO CAPS
200.0000 mg | ORAL_CAPSULE | Freq: Every day | ORAL | Status: DC
  Administered 2015-09-17 – 2015-09-20 (×4): 200 mg via ORAL
  Filled 2015-09-17 (×4): qty 2

## 2015-09-17 MED ORDER — PIPERACILLIN-TAZOBACTAM 3.375 G IVPB 30 MIN
3.3750 g | Freq: Once | INTRAVENOUS | Status: AC
  Administered 2015-09-17: 3.375 g via INTRAVENOUS
  Filled 2015-09-17: qty 50

## 2015-09-17 MED ORDER — ONDANSETRON HCL 4 MG/2ML IJ SOLN: 4.0000 mg | Freq: Four times a day (QID) | INTRAMUSCULAR | Status: DC | PRN

## 2015-09-17 MED ORDER — ISOSORBIDE MONONITRATE ER 30 MG PO TB24
45.0000 mg | ORAL_TABLET | Freq: Every day | ORAL | Status: DC
  Administered 2015-09-17: 45 mg via ORAL
  Filled 2015-09-17: qty 2

## 2015-09-17 MED ORDER — SODIUM CHLORIDE 0.9 % IV SOLN
INTRAVENOUS | Status: AC
  Administered 2015-09-17: 08:00:00 via INTRAVENOUS

## 2015-09-17 MED ORDER — ACYCLOVIR 200 MG PO CAPS
400.0000 mg | ORAL_CAPSULE | Freq: Three times a day (TID) | ORAL | Status: DC
  Administered 2015-09-17 – 2015-09-20 (×10): 400 mg via ORAL
  Filled 2015-09-17 (×10): qty 2

## 2015-09-17 MED ORDER — ONDANSETRON HCL 4 MG PO TABS: 4.0000 mg | ORAL_TABLET | Freq: Four times a day (QID) | ORAL | Status: DC | PRN

## 2015-09-17 MED ORDER — LEVOTHYROXINE SODIUM 100 MCG PO TABS
100.0000 ug | ORAL_TABLET | Freq: Every day | ORAL | Status: DC
  Administered 2015-09-17 – 2015-09-20 (×4): 100 ug via ORAL
  Filled 2015-09-17 (×4): qty 1

## 2015-09-17 MED ORDER — IBUPROFEN 800 MG PO TABS
800.0000 mg | ORAL_TABLET | Freq: Once | ORAL | Status: AC
  Administered 2015-09-17: 800 mg via ORAL
  Filled 2015-09-17: qty 1

## 2015-09-17 MED ORDER — SODIUM CHLORIDE 0.9 % IV BOLUS (SEPSIS)
1000.0000 mL | INTRAVENOUS | Status: DC
  Administered 2015-09-17 (×2): 1000 mL via INTRAVENOUS

## 2015-09-17 MED ORDER — ACETAMINOPHEN 650 MG RE SUPP: 650.0000 mg | Freq: Four times a day (QID) | RECTAL | Status: DC | PRN

## 2015-09-17 MED ORDER — IPRATROPIUM-ALBUTEROL 0.5-2.5 (3) MG/3ML IN SOLN: 3.0000 mL | Freq: Four times a day (QID) | RESPIRATORY_TRACT | Status: DC | PRN

## 2015-09-17 MED ORDER — PHENYTOIN SODIUM EXTENDED 100 MG PO CAPS
300.0000 mg | ORAL_CAPSULE | Freq: Every day | ORAL | Status: DC
  Administered 2015-09-17 – 2015-09-19 (×3): 300 mg via ORAL
  Filled 2015-09-17 (×3): qty 3

## 2015-09-17 MED ORDER — WARFARIN - PHARMACIST DOSING INPATIENT
Status: DC
  Administered 2015-09-18 – 2015-09-19 (×2)

## 2015-09-17 MED ORDER — PIPERACILLIN-TAZOBACTAM 3.375 G IVPB
3.3750 g | Freq: Three times a day (TID) | INTRAVENOUS | Status: DC
  Administered 2015-09-17 – 2015-09-19 (×6): 3.375 g via INTRAVENOUS
  Filled 2015-09-17 (×10): qty 50

## 2015-09-17 MED ORDER — METOPROLOL TARTRATE 25 MG PO TABS
25.0000 mg | ORAL_TABLET | Freq: Two times a day (BID) | ORAL | Status: DC
  Administered 2015-09-17 – 2015-09-20 (×7): 25 mg via ORAL
  Filled 2015-09-17 (×7): qty 1

## 2015-09-17 MED ORDER — ATORVASTATIN CALCIUM 40 MG PO TABS
80.0000 mg | ORAL_TABLET | Freq: Every day | ORAL | Status: DC
  Administered 2015-09-17 – 2015-09-19 (×3): 80 mg via ORAL
  Filled 2015-09-17 (×4): qty 2

## 2015-09-17 NOTE — ED Notes (Signed)
Pt is resident of [facility], sent over for fever 104 earlier, reportedly had tylenol approx 2 hour sgo.   Pt c/o headache

## 2015-09-17 NOTE — H&P (Signed)
PCP:   [name], MD   Chief Complaint:  fever  HPI: 65 yo female h/o CVA with residual left sided hemiplegia, bedbound, chf, copd sent in from her SNF for fever.  Pt reports she has had a cough and nasal congestion for 4 days, but also dysuria for over a day.  Also some suprapubic pain.  No n/v/d.  No rashes.  No chest pain.  No swelling.  Temp was 103 on arrival.  Referred for admission for uti with possible sepsis.  Review of Systems:  Positive and negative as per HPI otherwise all other systems are negative  Past Medical History: Past Medical History  Diagnosis Date  . COPD (chronic obstructive pulmonary disease) ([place])   . Heart failure ([place])   . Hemiplegia ([place])   . Hemiparesis ([place])   . Thyroid disease   . Depression   . Bipolar 1 disorder, depressed ([place])   . Gastroesophageal reflux    History reviewed. No pertinent past surgical history.  Medications: Prior to Admission medications   Medication Sig Start Date End Date Taking? Authorizing Provider  acetaminophen (TYLENOL) 325 MG tablet Take 650 mg by mouth every 4 (four) hours as needed.    Historical Provider, MD  acyclovir (ZOVIRAX) 400 MG tablet Take 400 mg by mouth 3 (three) times daily.    Historical Provider, MD  aspirin EC 81 MG tablet Take 81 mg by mouth daily.    Historical Provider, MD  atorvastatin (LIPITOR) 80 MG tablet Take 1 tablet (80 mg total) by mouth daily. 07/18/15   Arnoldo Lenis, MD  calcium-vitamin D (OSCAL WITH D) 500-200 MG-UNIT per tablet Take 1 tablet by mouth daily with breakfast.    Historical Provider, MD  cetirizine (ZYRTEC) 10 MG tablet Take 10 mg by mouth daily.    Historical Provider, MD  Cholecalciferol (VITAMIN D3) 3000 UNITS TABS Take 1,000 Units by mouth daily.    Historical Provider, MD  DULoxetine (CYMBALTA) 60 MG capsule Take 60 mg by mouth at bedtime. 2 tabs hs    Historical Provider, MD  esomeprazole (NEXIUM) 40 MG capsule Take 40 mg by mouth daily at 12 noon.    Historical  Provider, MD  fentaNYL (DURAGESIC - DOSED MCG/HR) 50 MCG/HR Place 50 mcg onto the skin every 3 (three) days.    Historical Provider, MD  fluticasone (FLONASE) 50 MCG/ACT nasal spray Place 1 spray into both nostrils at bedtime.    Historical Provider, MD  furosemide (LASIX) 20 MG tablet Take 20 mg by mouth.    Historical Provider, MD  hydrOXYzine (ATARAX/VISTARIL) 50 MG tablet Take 50 mg by mouth at bedtime as needed.    Historical Provider, MD  ipratropium-albuterol (DUONEB) 0.5-2.5 (3) MG/3ML SOLN Take 3 mLs by nebulization 3 (three) times daily as needed.    Historical Provider, MD  isosorbide mononitrate (IMDUR) 30 MG 24 hr tablet Take 1.5 tablets (45 mg total) by mouth daily. 07/18/15   Arnoldo Lenis, MD  levothyroxine (SYNTHROID, LEVOTHROID) 100 MCG tablet Take 100 mcg by mouth daily before breakfast.    Historical Provider, MD  metoprolol tartrate (LOPRESSOR) 25 MG tablet Take 1 tablet (25 mg total) by mouth 2 (two) times daily. 05/16/15   Arnoldo Lenis, MD  Multiple Vitamins-Minerals (MULTIVITAMIN ADULT PO) Take 1 tablet by mouth daily.    Historical Provider, MD  nitroGLYCERIN (NITROSTAT) 0.4 MG SL tablet Place 0.4 mg under the tongue every 5 (five) minutes as needed for chest pain.    Historical  Provider, MD  nortriptyline (PAMELOR) 50 MG capsule Take 50 mg by mouth at bedtime.    Historical Provider, MD  omeprazole (PRILOSEC) 20 MG capsule Take 20 mg by mouth daily.    Historical Provider, MD  ondansetron (ZOFRAN) 4 MG tablet Take 4 mg by mouth every 8 (eight) hours as needed for nausea or vomiting.    Historical Provider, MD  phenytoin (DILANTIN) 100 MG ER capsule Take 100 mg by mouth 2 (two) times daily. 2 tabs am,3 tabs at hs    Historical Provider, MD  pregabalin (LYRICA) 75 MG capsule Take 75 mg by mouth daily.    Historical Provider, MD  Propylene Glycol (SYSTANE BALANCE) 0.6 % SOLN Apply 1 drop to eye daily. Both eyes    Historical Provider, MD  warfarin (COUMADIN) 6 MG tablet  Take 6 mg by mouth daily.    Historical Provider, MD    Allergies:   Allergies  Allergen Reactions  . Latex Other (See Comments)    unknown    Social History:  reports that she quit smoking about 12 years ago. Her smoking use included Cigarettes. She smoked 0.50 packs per day. She has never used smokeless tobacco. She reports that she does not drink alcohol. Her drug history is not on file.  Family History: No premature CAD  Physical Exam: Filed Vitals:   09/17/15 0330 09/17/15 0400 09/17/15 0500 09/17/15 0525  BP: 117/81 108/92 92/68 130/73  Pulse: 121  112 113  Temp:    101.8 F (38.8 C)  TempSrc:    Oral  Resp:   20 23  Height:      Weight:      SpO2: 95%  96% 95%   General appearance: alert, cooperative and no distress Head: Normocephalic, without obvious abnormality, atraumatic Eyes: negative Nose: Nares normal. Septum midline. Mucosa normal. No drainage or sinus tenderness. Neck: no JVD and supple, symmetrical, trachea midline Lungs: clear to auscultation bilaterally Heart: regular rate and rhythm, S1, S2 normal, no murmur, click, rub or gallop Abdomen: soft, non-tender; bowel sounds normal; no masses,  no organomegaly Extremities: extremities normal, atraumatic, no cyanosis or edema Pulses: 2+ and symmetric Skin: Skin color, texture, turgor normal. No rashes or lesions Neurologic: Grossly normal x left hemiplegia    Labs on Admission:   Recent Labs  09/17/15 0345  NA 138  K 4.1  CL 100*  CO2 31  GLUCOSE 128*  BUN 10  CREATININE 0.78  CALCIUM 8.8*    Recent Labs  09/17/15 0345  AST 25  ALT 24  ALKPHOS 126  BILITOT 0.5  PROT 6.9  ALBUMIN 2.8*     Recent Labs  09/17/15 0345  WBC 11.6*  NEUTROABS 8.6*  HGB 12.7  HCT 39.5  MCV 94.3  PLT 148*    Radiological Exams on Admission: Dg Chest Port 1 View  09/17/2015  CLINICAL DATA:  Fever.  Headache. EXAM: PORTABLE CHEST 1 VIEW COMPARISON:  05/29/2015 FINDINGS: There is mild unchanged  left hemidiaphragm elevation and moderate cardiomegaly. Mild central vascular prominence. No large effusions. No confluent airspace consolidation. IMPRESSION: Stable cardiomegaly. Mild chronic appearing central vascular prominence. Electronically Signed   By: Andreas Newport M.D.   On: 09/17/2015 04:21    Assessment/Plan  64 yo female with uti and early sepsis  Principal Problem:   UTI (lower urinary tract infection)- with likely early sepsis.  Iv zosyn.  Blood and urine cx done.  Does not have chronic foley.  Check quickflu also for uri  symptoms.  Source however likely urine.  cxr neg. Lactic acid level normal.  Given 1.5 liters ns ivf in ED, will not bolus any further.  Active Problems:   Seizures ([place])-  Noted, clarify home meds   COPD (chronic obstructive pulmonary disease) ([place])- stable, compensated,    Hx of CABG- noted,   Paraplegia ([place])- noted, from previous CVA  obs on medical.  Full code.  Clarify home medications.  Mannat Benedetti A 09/17/2015, 6:01 AM

## 2015-09-17 NOTE — Progress Notes (Signed)
[facility] PROGRESS NOTE  Karen Mccann [id number] DOB: 02-19-1951 DOA: 09/17/2015  PCP: [name], MD  Brief HPI: 64 year old Caucasian female with a past medical history of stroke with residual left-sided weakness who is bedbound. She has a history also of CHF, COPD. She is on chronic anticoagulation with Coumadin. She lives in a skilled nursing facility. She was brought into the hospital due to fever. She had a temperature up to 104F. Apparently had been having a cough and nasal congestion for 4 days. Also had been complaining of dysuria. She was hospitalized for further management.  Past medical history:  Past Medical History  Diagnosis Date  . COPD (chronic obstructive pulmonary disease) ([place])   . Heart failure ([place])   . Hemiplegia ([place])   . Hemiparesis ([place])   . Thyroid disease   . Depression   . Bipolar 1 disorder, depressed ([place])   . Gastroesophageal reflux   . Stroke [facility])     Consultants: None  Procedures: None  Antibiotics: Zosyn 12/4  Subjective: Patient slightly lethargic this morning. Easily arousable. She tells me that she couldn't sleep any last night and so she is very sleepy this morning. Denies any pain. No nausea, vomiting.  Objective: Vital Signs  Filed Vitals:   09/17/15 0600 09/17/15 0612 09/17/15 0630 09/17/15 0743  BP: 109/84  94/60 121/69  Pulse: 110  105 93  Temp:  99.1 F (37.3 C)  100.2 F (37.9 C)  TempSrc:  Oral  Oral  Resp: 20  21   Height:    5\' 6"  (1.676 m)  Weight:    129.275 kg (285 lb)  SpO2: 97%  97% 97%   No intake or output data in the 24 hours ending 09/17/15 0854 Filed Weights   09/17/15 0325 09/17/15 0743  Weight: 127.007 kg (280 lb) 129.275 kg (285 lb)    General appearance: Somnolent. Easily arousable. No distress. Resp: clear to auscultation bilaterally Cardio: regular rate and rhythm, S1, S2 normal, no murmur, click, rub or gallop GI: soft, non-tender; bowel sounds normal; no masses,  no  organomegaly Extremities: extremities normal, atraumatic, no cyanosis or edema Neurologic: Somnolent but arousable. Left-sided weakness from previous stroke. No other new deficits noted.  Lab Results:  Basic Metabolic Panel:  Recent Labs Lab 09/17/15 0345  NA 138  K 4.1  CL 100*  CO2 31  GLUCOSE 128*  BUN 10  CREATININE 0.78  CALCIUM 8.8*   Liver Function Tests:  Recent Labs Lab 09/17/15 0345  AST 25  ALT 24  ALKPHOS 126  BILITOT 0.5  PROT 6.9  ALBUMIN 2.8*   CBC:  Recent Labs Lab 09/17/15 0345  WBC 11.6*  NEUTROABS 8.6*  HGB 12.7  HCT 39.5  MCV 94.3  PLT 148*   CBG: No results for input(s): GLUCAP in the last 168 hours.  Recent Results (from the past 240 hour(s))  Blood Culture (routine x 2)     Status: None (Preliminary result)   Collection Time: 09/17/15  3:55 AM  Result Value Ref Range Status   Specimen Description RIGHT ANTECUBITAL  Final   Special Requests BOTTLES DRAWN AEROBIC AND ANAEROBIC 6CC  Final   Culture PENDING  Incomplete   Report Status PENDING  Incomplete  Blood Culture (routine x 2)     Status: None (Preliminary result)   Collection Time: 09/17/15  4:00 AM  Result Value Ref Range Status   Specimen Description RIGHT ANTECUBITAL  Final   Special Requests BOTTLES DRAWN AEROBIC AND  ANAEROBIC [facility]  Final   Culture PENDING  Incomplete   Report Status PENDING  Incomplete      Studies/Results: Dg Chest Port 1 View  09/17/2015  CLINICAL DATA:  Fever.  Headache. EXAM: PORTABLE CHEST 1 VIEW COMPARISON:  05/29/2015 FINDINGS: There is mild unchanged left hemidiaphragm elevation and moderate cardiomegaly. Mild central vascular prominence. No large effusions. No confluent airspace consolidation. IMPRESSION: Stable cardiomegaly. Mild chronic appearing central vascular prominence. Electronically Signed   By: Andreas Newport M.D.   On: 09/17/2015 04:21    Medications:  Scheduled: . acyclovir  400 mg Oral TID  . atorvastatin  80 mg Oral q1800    . isosorbide mononitrate  45 mg Oral Daily  . levothyroxine  100 mcg Oral QAC breakfast  . metoprolol tartrate  25 mg Oral BID  . phenytoin  200 mg Oral Daily  . phenytoin  300 mg Oral QHS  . piperacillin-tazobactam (ZOSYN)  IV  3.375 g Intravenous Q8H  . Warfarin - Pharmacist Dosing Inpatient   Does not apply Q24H   Continuous: . sodium chloride 75 mL/hr at 09/17/15 0756   [id number] **OR** acetaminophen, ipratropium-albuterol, ondansetron **OR** ondansetron (ZOFRAN) IV  Assessment/Plan:  Principal Problem:   UTI (lower urinary tract infection) Active Problems:   Seizures ([facility])   COPD (chronic obstructive pulmonary disease) ([facility])   Hx of CABG   Paraplegia ([facility])    Fever Early sepsis cannot be ruled out. Although her vital signs are stable currently. Fever thought to be secondary to UTI. Continue Zosyn for now. Chest x-ray did not show any infiltrates. Influenza PCR is negative. Await blood and urine culture reports. Continue Zosyn for now.  Possible UTI As above. Await cultures.  History of stroke with left hemiplegia Stable. Patient is bedbound. She resides in a skilled nursing facility. She is on warfarin for secondary prophylaxis.  History of coronary artery disease Followed by cardiology as outpatient. Had a Myoview stress test in August 2016 without any clear ischemia. Continue imdur and other medical management.  On long-term anticoagulation for history of stroke INR is supratherapeutic. Pharmacy to dose warfarin.  History of seizure disorder Patient is on Dilantin. This will be resumed.  History of COPD Stable. Continue home medications.  History of hypothyroidism Continue level thyroxine  DVT Prophylaxis: On warfarin    Code Status: Full code  Family Communication: Discussed with the patient. No family at bedside  Disposition Plan: Await improvement. Continue current management.      [facility] Pager  [phone] 09/17/2015, 8:54 AM  If 7PM-7AM, please contact night-coverage at [email or web address], password [facility]

## 2015-09-17 NOTE — ED Provider Notes (Signed)
CSN: [id number]     Arrival date & time 09/17/15  0319 History   First MD Initiated Contact with Patient 09/17/15 0401     Chief Complaint  Patient presents with  . Fever     Patient is a 64 y.o. female presenting with fever. The history is provided by the patient.  Fever Severity:  Moderate Onset quality:  Gradual Duration:  3 days Timing:  Constant Progression:  Worsening Chronicity:  New Relieved by:  Nothing Worsened by:  Nothing tried Associated symptoms: cough and headaches   Associated symptoms: no diarrhea, no rash and no vomiting   Associated symptoms comment:  Abdominal pain  Pt with h/o multiple medical problems including previous CVA with left sided paresis, obesity, presents with fever.  She reports HA, mild cough, sore throat, and lower abdominal pain No vomiting/diarrhea Pt has no other complaints at this time  Past Medical History  Diagnosis Date  . COPD (chronic obstructive pulmonary disease) ([place])   . Heart failure ([place])   . Hemiplegia ([place])   . Hemiparesis ([place])   . Thyroid disease   . Depression   . Bipolar 1 disorder, depressed ([place])   . Gastroesophageal reflux    History reviewed. No pertinent past surgical history. No family history on file. Social History  Substance Use Topics  . Smoking status: Former Smoker -- 0.50 packs/day    Types: Cigarettes    Quit date: 05/16/2003  . Smokeless tobacco: Never Used  . Alcohol Use: No   OB History    No data available     Review of Systems  Constitutional: Positive for fever.  Respiratory: Positive for cough.   Gastrointestinal: Negative for vomiting and diarrhea.  Skin: Negative for rash.  Neurological: Positive for headaches.  All other systems reviewed and are negative.     Allergies  Latex  Home Medications   Prior to Admission medications   Medication Sig Start Date End Date Taking? Authorizing Provider  acetaminophen (TYLENOL) 325 MG tablet Take 650 mg by mouth every 4 (four)  hours as needed.    Historical Provider, MD  acyclovir (ZOVIRAX) 400 MG tablet Take 400 mg by mouth 3 (three) times daily.    Historical Provider, MD  aspirin EC 81 MG tablet Take 81 mg by mouth daily.    Historical Provider, MD  atorvastatin (LIPITOR) 80 MG tablet Take 1 tablet (80 mg total) by mouth daily. 07/18/15   Arnoldo Lenis, MD  calcium-vitamin D (OSCAL WITH D) 500-200 MG-UNIT per tablet Take 1 tablet by mouth daily with breakfast.    Historical Provider, MD  cetirizine (ZYRTEC) 10 MG tablet Take 10 mg by mouth daily.    Historical Provider, MD  Cholecalciferol (VITAMIN D3) 3000 UNITS TABS Take 1,000 Units by mouth daily.    Historical Provider, MD  DULoxetine (CYMBALTA) 60 MG capsule Take 60 mg by mouth at bedtime. 2 tabs hs    Historical Provider, MD  esomeprazole (NEXIUM) 40 MG capsule Take 40 mg by mouth daily at 12 noon.    Historical Provider, MD  fentaNYL (DURAGESIC - DOSED MCG/HR) 50 MCG/HR Place 50 mcg onto the skin every 3 (three) days.    Historical Provider, MD  fluticasone (FLONASE) 50 MCG/ACT nasal spray Place 1 spray into both nostrils at bedtime.    Historical Provider, MD  furosemide (LASIX) 20 MG tablet Take 20 mg by mouth.    Historical Provider, MD  hydrOXYzine (ATARAX/VISTARIL) 50 MG tablet Take 50 mg by mouth at  bedtime as needed.    Historical Provider, MD  ipratropium-albuterol (DUONEB) 0.5-2.5 (3) MG/3ML SOLN Take 3 mLs by nebulization 3 (three) times daily as needed.    Historical Provider, MD  isosorbide mononitrate (IMDUR) 30 MG 24 hr tablet Take 1.5 tablets (45 mg total) by mouth daily. 07/18/15   Arnoldo Lenis, MD  levothyroxine (SYNTHROID, LEVOTHROID) 100 MCG tablet Take 100 mcg by mouth daily before breakfast.    Historical Provider, MD  metoprolol tartrate (LOPRESSOR) 25 MG tablet Take 1 tablet (25 mg total) by mouth 2 (two) times daily. 05/16/15   Arnoldo Lenis, MD  Multiple Vitamins-Minerals (MULTIVITAMIN ADULT PO) Take 1 tablet by mouth daily.     Historical Provider, MD  nitroGLYCERIN (NITROSTAT) 0.4 MG SL tablet Place 0.4 mg under the tongue every 5 (five) minutes as needed for chest pain.    Historical Provider, MD  nortriptyline (PAMELOR) 50 MG capsule Take 50 mg by mouth at bedtime.    Historical Provider, MD  omeprazole (PRILOSEC) 20 MG capsule Take 20 mg by mouth daily.    Historical Provider, MD  ondansetron (ZOFRAN) 4 MG tablet Take 4 mg by mouth every 8 (eight) hours as needed for nausea or vomiting.    Historical Provider, MD  phenytoin (DILANTIN) 100 MG ER capsule Take 100 mg by mouth 2 (two) times daily. 2 tabs am,3 tabs at hs    Historical Provider, MD  pregabalin (LYRICA) 75 MG capsule Take 75 mg by mouth daily.    Historical Provider, MD  Propylene Glycol (SYSTANE BALANCE) 0.6 % SOLN Apply 1 drop to eye daily. Both eyes    Historical Provider, MD  warfarin (COUMADIN) 6 MG tablet Take 6 mg by mouth daily.    Historical Provider, MD   BP 134/83 mmHg  Pulse 126  Temp(Src) 103.7 F (39.8 C) (Oral)  Resp 17  Ht 5\' 6"  (1.676 m)  Wt 127.007 kg  BMI 45.21 kg/m2  SpO2 93% Physical Exam CONSTITUTIONAL: ill appearing, mild distress noted HEAD: Normocephalic/atraumatic EYES: EOMI/PERRL ENMT: Mucous membranes moist NECK: supple no meningeal signs SPINE/BACK:entire spine nontender CV: S1/S2 noted, no murmurs/rubs/gallops noted LUNGS: decreased BS noted bilaterally, no distress noted ABDOMEN: soft, nontender, she is obese NEURO: Pt is awake/alert/appropriate, left sided paresis (chronic) noted.   EXTREMITIES: pulses normal/equal, full ROM SKIN: warm, color normal PSYCH: no abnormalities of mood noted, alert and oriented to situation  ED Course  Procedures  CRITICAL CARE Performed by: Sharyon Cable Total critical care time: 33 minutes Critical care time was exclusive of separately billable procedures and treating other patients. Critical care was necessary to treat or prevent imminent or life-threatening  deterioration. Critical care was time spent personally by me on the following activities: development of treatment plan with patient and/or surrogate as well as nursing, discussions with consultants, evaluation of patient's response to treatment, examination of patient, obtaining history from patient or surrogate, ordering and performing treatments and interventions, ordering and review of laboratory studies, ordering and review of radiographic studies, pulse oximetry and re-evaluation of patient's condition. PATIENT WITH UTI AND SEPSIS HEART RATE >125 SHE REQUIRED 2 IV BOLUSES SHE REQUIRED IV ANTIBIOTICS  5:16 AM Pt with UTI IV fluids infusing  Antibiotics have been ordered 5:41 AM PT WITH UTI SHE WILL REQUIRE ADMISSION HER VITALS ARE IMPROVING D/W DR DAVID WILL ADMIT TO MED-SURG AS PATIENT HAS BEEN STABILIZED Labs Review Labs Reviewed  COMPREHENSIVE METABOLIC PANEL - Abnormal; Notable for the following:    Chloride 100 (*)  Glucose, Bld 128 (*)    Calcium 8.8 (*)    Albumin 2.8 (*)    All other components within normal limits  CBC WITH DIFFERENTIAL/PLATELET - Abnormal; Notable for the following:    WBC 11.6 (*)    RDW 21.3 (*)    Platelets 148 (*)    Neutro Abs 8.6 (*)    Monocytes Absolute 1.7 (*)    All other components within normal limits  URINALYSIS, ROUTINE W REFLEX MICROSCOPIC (NOT AT [facility]) - Abnormal; Notable for the following:    APPearance CLOUDY (*)    Hgb urine dipstick LARGE (*)    Protein, ur 100 (*)    Leukocytes, UA LARGE (*)    All other components within normal limits  URINE MICROSCOPIC-ADD ON - Abnormal; Notable for the following:    Squamous Epithelial / LPF 0-5 (*)    Bacteria, UA MANY (*)    All other components within normal limits  CULTURE, BLOOD (ROUTINE X 2)  CULTURE, BLOOD (ROUTINE X 2)  URINE CULTURE  PROTIME-INR  PHENYTOIN LEVEL, FREE AND TOTAL  INFLUENZA PANEL BY PCR (TYPE A & B, H1N1)  I-STAT CG4 LACTIC ACID, ED    Imaging  Review Dg Chest Port 1 View  09/17/2015  CLINICAL DATA:  Fever.  Headache. EXAM: PORTABLE CHEST 1 VIEW COMPARISON:  05/29/2015 FINDINGS: There is mild unchanged left hemidiaphragm elevation and moderate cardiomegaly. Mild central vascular prominence. No large effusions. No confluent airspace consolidation. IMPRESSION: Stable cardiomegaly. Mild chronic appearing central vascular prominence. Electronically Signed   By: Andreas Newport M.D.   On: 09/17/2015 04:21   I have personally reviewed and evaluated these images and lab results as part of my medical decision-making.   EKG Interpretation   Date/Time:  Sunday September 17 2015 04:02:34 EST Ventricular Rate:  120 PR Interval:  116 QRS Duration: 103 QT Interval:  317 QTC Calculation: 448 R Axis:   47 Text Interpretation:  Sinus tachycardia Non-specific ST-t changes Abnormal  ECG Confirmed by Christy Gentles  MD, Shatyra Becka ([id number]) on 09/17/2015 4:23:26 AM     Medications  sodium chloride 0.9 % bolus 1,000 mL (1,000 mLs Intravenous New Bag/Given 09/17/15 0405)  sodium chloride 0.9 % bolus 1,000 mL (not administered)  ibuprofen (ADVIL,MOTRIN) tablet 800 mg (800 mg Oral Given 09/17/15 0441)  piperacillin-tazobactam (ZOSYN) IVPB 3.375 g (0 g Intravenous Stopped 09/17/15 0526)    MDM   Final diagnoses:  Sepsis, due to unspecified organism [facility])  UTI (lower urinary tract infection)    Nursing notes including past medical history and social history reviewed and considered in documentation xrays/imaging reviewed by myself and considered during evaluation Labs/vital reviewed myself and considered during evaluation     Ripley Fraise, MD 09/17/15 [phone]

## 2015-09-17 NOTE — ED Notes (Signed)
Made nurse aware of temp and heart rate.

## 2015-09-17 NOTE — Progress Notes (Signed)
ANTIBIOTIC CONSULT NOTE - INITIAL  Pharmacy Consult for Zosyn Indication: rule out sepsis  Allergies  Allergen Reactions  . Latex Other (See Comments)    unknown   Patient Measurements: Height: 5\' 6"  (167.6 cm) Weight: 285 lb (129.275 kg) IBW/kg (Calculated) : 59.3  Vital Signs: Temp: 100.2 F (37.9 C) (12/04 0743) Temp Source: Oral (12/04 0743) BP: 121/69 mmHg (12/04 0743) Pulse Rate: 93 (12/04 0743) Intake/Output from previous day:   Intake/Output from this shift:    Labs:  Recent Labs  09/17/15 0345  WBC 11.6*  HGB 12.7  PLT 148*  CREATININE 0.78   Estimated Creatinine Clearance: 100.5 mL/min (by C-G formula based on Cr of 0.78). No results for input(s): VANCOTROUGH, VANCOPEAK, VANCORANDOM, GENTTROUGH, GENTPEAK, GENTRANDOM, TOBRATROUGH, TOBRAPEAK, TOBRARND, AMIKACINPEAK, AMIKACINTROU, AMIKACIN in the last 72 hours.   Microbiology: Recent Results (from the past 720 hour(s))  Blood Culture (routine x 2)     Status: None (Preliminary result)   Collection Time: 09/17/15  3:55 AM  Result Value Ref Range Status   Specimen Description RIGHT ANTECUBITAL  Final   Special Requests BOTTLES DRAWN AEROBIC AND ANAEROBIC 6CC  Final   Culture PENDING  Incomplete   Report Status PENDING  Incomplete  Blood Culture (routine x 2)     Status: None (Preliminary result)   Collection Time: 09/17/15  4:00 AM  Result Value Ref Range Status   Specimen Description RIGHT ANTECUBITAL  Final   Special Requests BOTTLES DRAWN AEROBIC AND ANAEROBIC 6CC  Final   Culture PENDING  Incomplete   Report Status PENDING  Incomplete    Medical History: Past Medical History  Diagnosis Date  . COPD (chronic obstructive pulmonary disease) ([place])   . Heart failure ([place])   . Hemiplegia ([place])   . Hemiparesis ([place])   . Thyroid disease   . Depression   . Bipolar 1 disorder, depressed ([place])   . Gastroesophageal reflux   . Stroke [facility])    Anti-infectives    Start     Dose/Rate Route Frequency  Ordered Stop   09/17/15 1400  piperacillin-tazobactam (ZOSYN) IVPB 3.375 g     3.375 g 12.5 mL/hr over 240 Minutes Intravenous Every 8 hours 09/17/15 0834     09/17/15 1000  acyclovir (ZOVIRAX) 200 MG capsule 400 mg     400 mg Oral 3 times daily 09/17/15 0852     09/17/15 0415  piperacillin-tazobactam (ZOSYN) IVPB 3.375 g     3.375 g 100 mL/hr over 30 Minutes Intravenous  Once 09/17/15 [id number] 09/17/15 0526     Assessment: 64 yo female h/o CVA with residual left sided hemiplegia, bedbound, chf, copd sent in from her SNF for fever. Pt reports she has had a cough and nasal congestion for 4 days, but also dysuria for over a day. Also some suprapubic pain. No n/v/d. No rashes. No chest pain. No swelling. Temp was 103 on arrival. Referred for admission for uti with possible sepsis.  Goal of Therapy:  Eradicate infection.  Plan:  Zosyn 3.375gm IV q8h, EID Monitor labs, progress and c/s  [place] Crane, Arlander Gillen A 09/17/2015,8:57 AM

## 2015-09-17 NOTE — ED Notes (Signed)
Unable to obtain second IV, MD aware.

## 2015-09-17 NOTE — ED Notes (Signed)
IV fluids decreased to 75 ml/hr per Dr. Camelia Eng verbal order.

## 2015-09-17 NOTE — ED Notes (Signed)
In and out cath patient with two person assist.

## 2015-09-17 NOTE — Progress Notes (Signed)
ANTICOAGULATION CONSULT NOTE - Initial Consult  Pharmacy Consult for Coumadin (chronic Rx PTA) Indication: stroke  Allergies  Allergen Reactions  . Latex Other (See Comments)    unknown   Patient Measurements: Height: 5\' 6"  (167.6 cm) Weight: 285 lb (129.275 kg) IBW/kg (Calculated) : 59.3  Vital Signs: Temp: 100.2 F (37.9 C) (12/04 0743) Temp Source: Oral (12/04 0743) BP: 121/69 mmHg (12/04 0743) Pulse Rate: 93 (12/04 0743)  Labs:  Recent Labs  09/17/15 0345  HGB 12.7  HCT 39.5  PLT 148*  LABPROT 34.9*  INR 3.58*  CREATININE 0.78    Estimated Creatinine Clearance: 100.5 mL/min (by C-G formula based on Cr of 0.78).   Medical History: Past Medical History  Diagnosis Date  . COPD (chronic obstructive pulmonary disease) ([place])   . Heart failure ([place])   . Hemiplegia ([place])   . Hemiparesis ([place])   . Thyroid disease   . Depression   . Bipolar 1 disorder, depressed ([place])   . Gastroesophageal reflux   . Stroke [facility])     Medications:  Prescriptions prior to admission  Medication Sig Dispense Refill Last Dose  . acetaminophen (TYLENOL) 325 MG tablet Take 650 mg by mouth every 4 (four) hours as needed.   Taking  . acyclovir (ZOVIRAX) 400 MG tablet Take 400 mg by mouth 3 (three) times daily.   Taking  . aspirin EC 81 MG tablet Take 81 mg by mouth daily.   Taking  . atorvastatin (LIPITOR) 80 MG tablet Take 1 tablet (80 mg total) by mouth daily. 90 tablet 3   . calcium-vitamin D (OSCAL WITH D) 500-200 MG-UNIT per tablet Take 1 tablet by mouth daily with breakfast.   Taking  . cetirizine (ZYRTEC) 10 MG tablet Take 10 mg by mouth daily.   Taking  . Cholecalciferol (VITAMIN D3) 3000 UNITS TABS Take 1,000 Units by mouth daily.   Taking  . DULoxetine (CYMBALTA) 60 MG capsule Take 60 mg by mouth at bedtime. 2 tabs hs   Taking  . esomeprazole (NEXIUM) 40 MG capsule Take 40 mg by mouth daily at 12 noon.   Taking  . fentaNYL (DURAGESIC - DOSED MCG/HR) 50 MCG/HR Place 50 mcg  onto the skin every 3 (three) days.   Taking  . fluticasone (FLONASE) 50 MCG/ACT nasal spray Place 1 spray into both nostrils at bedtime.   Taking  . furosemide (LASIX) 20 MG tablet Take 20 mg by mouth.   Taking  . hydrOXYzine (ATARAX/VISTARIL) 50 MG tablet Take 50 mg by mouth at bedtime as needed.   Taking  . ipratropium-albuterol (DUONEB) 0.5-2.5 (3) MG/3ML SOLN Take 3 mLs by nebulization 3 (three) times daily as needed.   Taking  . isosorbide mononitrate (IMDUR) 30 MG 24 hr tablet Take 1.5 tablets (45 mg total) by mouth daily. 135 tablet 3   . levothyroxine (SYNTHROID, LEVOTHROID) 100 MCG tablet Take 100 mcg by mouth daily before breakfast.   Taking  . metoprolol tartrate (LOPRESSOR) 25 MG tablet Take 1 tablet (25 mg total) by mouth 2 (two) times daily. 180 tablet 3   . Multiple Vitamins-Minerals (MULTIVITAMIN ADULT PO) Take 1 tablet by mouth daily.   Taking  . nitroGLYCERIN (NITROSTAT) 0.4 MG SL tablet Place 0.4 mg under the tongue every 5 (five) minutes as needed for chest pain.   Taking  . nortriptyline (PAMELOR) 50 MG capsule Take 50 mg by mouth at bedtime.   Taking  . omeprazole (PRILOSEC) 20 MG capsule Take 20 mg by mouth daily.  Taking  . ondansetron (ZOFRAN) 4 MG tablet Take 4 mg by mouth every 8 (eight) hours as needed for nausea or vomiting.   Taking  . phenytoin (DILANTIN) 100 MG ER capsule Take 100 mg by mouth 2 (two) times daily. 2 tabs am,3 tabs at hs   Taking  . pregabalin (LYRICA) 75 MG capsule Take 75 mg by mouth daily.   Taking  . Propylene Glycol (SYSTANE BALANCE) 0.6 % SOLN Apply 1 drop to eye daily. Both eyes   Taking  . warfarin (COUMADIN) 6 MG tablet Take 6 mg by mouth daily.   Taking    Assessment: 64yo female on chronic Coumadin PTA, home dose listed above.  INR is SUPRAtherapeutic on admission.    Goal of Therapy:  INR 2-3 Monitor platelets by anticoagulation protocol: Yes   Plan:  HOLD coumadin today INR daily Monitor for s/sx of bleeding  [place] Crane, Burnie Hank  A 09/17/2015,8:59 AM

## 2015-09-18 DIAGNOSIS — I25810 Atherosclerosis of coronary artery bypass graft(s) without angina pectoris: Secondary | ICD-10-CM

## 2015-09-18 LAB — CBC
HEMATOCRIT: 36 % (ref 36.0–46.0)
HEMOGLOBIN: 11.4 g/dL — AB (ref 12.0–15.0)
MCH: 29.9 pg (ref 26.0–34.0)
MCHC: 31.7 g/dL (ref 30.0–36.0)
MCV: 94.5 fL (ref 78.0–100.0)
Platelets: 154 10*3/uL (ref 150–400)
RBC: 3.81 MIL/uL — AB (ref 3.87–5.11)
RDW: 20.7 % — ABNORMAL HIGH (ref 11.5–15.5)
WBC: 7.1 10*3/uL (ref 4.0–10.5)

## 2015-09-18 LAB — PHENYTOIN LEVEL, FREE AND TOTAL
PHENYTOIN FREE: 1.2 ug/mL (ref 1.0–2.0)
PHENYTOIN, TOTAL: 9.1 ug/mL — AB (ref 10.0–20.0)

## 2015-09-18 LAB — BASIC METABOLIC PANEL
ANION GAP: 6 (ref 5–15)
BUN: 8 mg/dL (ref 6–20)
CO2: 28 mmol/L (ref 22–32)
Calcium: 7.7 mg/dL — ABNORMAL LOW (ref 8.9–10.3)
Chloride: 107 mmol/L (ref 101–111)
Creatinine, Ser: 0.64 mg/dL (ref 0.44–1.00)
GFR calc Af Amer: >60 mL/min (ref >60)
Glucose, Bld: 91 mg/dL (ref 65–99)
POTASSIUM: 3.5 mmol/L (ref 3.5–5.1)
SODIUM: 141 mmol/L (ref 135–145)

## 2015-09-18 LAB — PROTIME-INR
INR: 2.76 — AB (ref 0.00–1.49)
Prothrombin Time: 28.7 seconds — ABNORMAL HIGH (ref 11.6–15.2)

## 2015-09-18 LAB — TROPONIN I

## 2015-09-18 MED ORDER — ISOSORBIDE MONONITRATE ER 60 MG PO TB24
60.0000 mg | ORAL_TABLET | Freq: Every day | ORAL | Status: DC
Start: 2015-09-18 — End: 2015-09-20
  Administered 2015-09-18 – 2015-09-20 (×3): 60 mg via ORAL
  Filled 2015-09-18 (×3): qty 1

## 2015-09-18 MED ORDER — WARFARIN SODIUM 5 MG PO TABS
5.0000 mg | ORAL_TABLET | Freq: Once | ORAL | Status: AC
  Administered 2015-09-18: 5 mg via ORAL
  Filled 2015-09-18: qty 1

## 2015-09-18 NOTE — Progress Notes (Signed)
ANTICOAGULATION CONSULT NOTE - follow up  Pharmacy Consult for Coumadin (chronic Rx PTA) Indication: stroke  Allergies  Allergen Reactions  . Latex Other (See Comments)    unknown   Patient Measurements: Height: 5\' 6"  (167.6 cm) Weight: 284 lb 1.6 oz (128.867 kg) IBW/kg (Calculated) : 59.3  Vital Signs: Temp: 99.3 F (37.4 C) (12/05 0548) Temp Source: Oral (12/05 0548) BP: 128/64 mmHg (12/05 0548) Pulse Rate: 88 (12/05 0548)  Labs:  Recent Labs  09/17/15 0345 09/18/15 0845  HGB 12.7 11.4*  HCT 39.5 36.0  PLT 148* 154  LABPROT 34.9* 28.7*  INR 3.58* 2.76*  CREATININE 0.78 0.64   Estimated Creatinine Clearance: 100.3 mL/min (by C-G formula based on Cr of 0.64).  Medical History: Past Medical History  Diagnosis Date  . COPD (chronic obstructive pulmonary disease) ([place])   . Heart failure ([place])   . Hemiplegia ([employer])   . Hemiparesis ([place])   . Thyroid disease   . Depression   . Bipolar 1 disorder, depressed ([place])   . Gastroesophageal reflux   . Stroke [facility])     Medications:  Prescriptions prior to admission  Medication Sig Dispense Refill Last Dose  . acyclovir (ZOVIRAX) 400 MG tablet Take 400 mg by mouth 2 (two) times daily.    09/16/2015  . aspirin EC 81 MG tablet Take 81 mg by mouth daily.   09/16/2015  . calcium-vitamin D (OSCAL WITH D) 500-200 MG-UNIT per tablet Take 1 tablet by mouth daily with breakfast.   09/17/2015  . cetirizine (ZYRTEC) 10 MG tablet Take 10 mg by mouth daily.   09/16/2015  . Cholecalciferol (VITAMIN D3) 3000 UNITS TABS Take 1,000 Units by mouth daily.   09/16/2015  . DULoxetine (CYMBALTA) 60 MG capsule Take 60 mg by mouth at bedtime.    09/15/2015  . ferrous sulfate 325 (65 FE) MG tablet Take 325 mg by mouth 2 (two) times daily with a meal.   09/16/2015  . furosemide (LASIX) 20 MG tablet Take 20 mg by mouth.   09/16/2015  . guaiFENesin (MUCINEX) 600 MG 12 hr tablet Take 600 mg by mouth 2 (two) times daily.   09/16/2015  .  HYDROcodone-acetaminophen (NORCO/VICODIN) 5-325 MG tablet Take 1 tablet by mouth every 6 (six) hours as needed for moderate pain.   09/15/2015  . hydrOXYzine (ATARAX/VISTARIL) 50 MG tablet Take 50 mg by mouth at bedtime.    09/15/2015  . isosorbide mononitrate (IMDUR) 30 MG 24 hr tablet Take 1.5 tablets (45 mg total) by mouth daily. 135 tablet 3 09/16/2015  . latanoprost (XALATAN) 0.005 % ophthalmic solution Place 1 drop into both eyes at bedtime.   09/16/2015  . levothyroxine (SYNTHROID, LEVOTHROID) 300 MCG tablet Take 300 mcg by mouth daily before breakfast.   09/17/2015  . LORazepam (ATIVAN) 1 MG tablet Take 1 mg by mouth every 6 (six) hours as needed for anxiety.   09/15/2015  . metoprolol tartrate (LOPRESSOR) 25 MG tablet Take 1 tablet (25 mg total) by mouth 2 (two) times daily. 180 tablet 3 09/17/2015 at 0900  . Multiple Vitamins-Minerals (MULTIVITAMIN ADULT PO) Take 1 tablet by mouth daily.   09/16/2015  . nortriptyline (PAMELOR) 50 MG capsule Take 50 mg by mouth at bedtime.   09/16/2015  . omeprazole (PRILOSEC) 20 MG capsule Take 20 mg by mouth daily.   09/16/2015  . phenytoin (DILANTIN) 100 MG ER capsule Take 200-300 mg by mouth 2 (two) times daily. 2 tabs am,3 tabs at hs   09/16/2015  .  pravastatin (PRAVACHOL) 80 MG tablet Take 80 mg by mouth daily.   09/16/2015  . pregabalin (LYRICA) 75 MG capsule Take 75 mg by mouth daily.   09/16/2015  . Propylene Glycol (SYSTANE BALANCE) 0.6 % SOLN Place 1 drop into both eyes 2 (two) times daily. Both eyes   09/16/2015  . senna (SENOKOT) 8.6 MG TABS tablet Take 1 tablet by mouth 2 (two) times daily.   09/16/2015  . vitamin C (ASCORBIC ACID) 500 MG tablet Take 500 mg by mouth 2 (two) times daily.   09/16/2015  . warfarin (COUMADIN) 2.5 MG tablet Take 2.5 mg by mouth daily. Takes with a 3 mg tablet to make 5.5 mg   09/16/2015  . warfarin (COUMADIN) 3 MG tablet Take 3 mg by mouth daily. Takes with a 2.5 mg tablet to make 5.5 mg   09/16/2015  . acetaminophen (TYLENOL) 325  MG tablet Take 650 mg by mouth every 4 (four) hours as needed.   09/14/2015  . fluticasone (FLONASE) 50 MCG/ACT nasal spray Place 1 spray into both nostrils at bedtime.   09/15/2015  . ipratropium-albuterol (DUONEB) 0.5-2.5 (3) MG/3ML SOLN Take 3 mLs by nebulization 3 (three) times daily as needed.   Unknown  . nitroGLYCERIN (NITROSTAT) 0.4 MG SL tablet Place 0.4 mg under the tongue every 5 (five) minutes as needed for chest pain.   Unknown  . ondansetron (ZOFRAN) 4 MG tablet Take 4 mg by mouth every 8 (eight) hours as needed for nausea or vomiting.   Unknown    Assessment: 64yo female on chronic Coumadin PTA, home dose listed above.  INR is SUPRAtherapeutic on admission but has now trended down to goal range.   Goal of Therapy:  INR 2-3 Monitor platelets by anticoagulation protocol: Yes   Plan:  Coumadin 5mg  po today x 1 INR daily Monitor for s/sx of bleeding  Hart Robinsons A 09/18/2015,9:22 AM

## 2015-09-18 NOTE — Clinical Social Work Note (Signed)
Clinical Social Work Assessment  Patient Details  Name: Karen Mccann MRN: [id number] Date of Birth: [birth year]  Date of referral:  09/18/15               Reason for consult:  Discharge Planning                Permission sought to share information with:    Permission granted to share information::     Name::        Agency::     Relationship::     Contact Information:     Housing/Transportation Living arrangements for the past 2 months:  [place] of Information:  Patient Patient Interpreter Needed:  None Criminal Activity/Legal Involvement Pertinent to Current Situation/Hospitalization:  No - Comment as needed Significant Relationships:  None Lives with:  Facility Resident Do you feel safe going back to the place where you live?  Yes Need for family participation in patient care:  No (Coment)  Care giving concerns:  Pt is long term resident at [facility].    Social Worker assessment / plan: CSW met with pt at bedside. Pt alert and oriented and reports she has been a resident at [facility] for over 2 years. She states she had a stroke 12 years ago and was at home, but eventually had to go to SNF as she was having frequent falls and didn't feel safe at home anymore. Pt shared that her daughter, Warren Lacy is not very involved. She became somewhat emotional when she talked about this situation. Pt states she is not sure what happened, but that they used to be so close. CSW provided support. Pt came to ED due to 104 fever. Admitted with possible UTI. Per Debbie at facility, pt gets up to wheelchair using lift. She requires extensive assist with ADLs due to left hemiplegia. Pt is nursing level of care and okay to return.    Employment status:  Disabled (Comment on whether or not currently receiving Disability) Insurance information:  Medicare PT Recommendations:  Not assessed at this time Information / Referral to community resources:  Other (Comment Required) (return to  [facility])  Patient/Family's Response to care:  Pt requests return to [facility] when medically stable.   Patient/Family's Understanding of and Emotional Response to Diagnosis, Current Treatment, and Prognosis:  Pt aware of admission diagnosis and treatment plan.   Emotional Assessment Appearance:  Appears stated age Attitude/Demeanor/Rapport:  Other (Cooperative) Affect (typically observed):  Other (somewhat emotional) Orientation:  Oriented to Self, Oriented to Place, Oriented to  Time, Oriented to Situation Alcohol / Substance use:  Not Applicable Psych involvement (Current and /or in the community):  No (Comment)  Discharge Needs  Concerns to be addressed:  Discharge Planning Concerns Readmission within the last 30 days:  No Current discharge risk:  Physical Impairment Barriers to Discharge:  Continued Medical Work up   Salome Arnt, [place] 09/18/2015, 9:11 AM [phone]

## 2015-09-18 NOTE — Care Management Note (Signed)
Case Management Note  Patient Details  Name: Karen Mccann MRN: [id number] Date of Birth: [birth year]  Subjective/Objective:                  Pt admitted from [facility] with UTI. Pt will return to facility when medically stable.  Action/Plan: CSW is aware and will arrange discharge to facility when medically stable.  Expected Discharge Date:  09/21/15               Expected Discharge Plan:  Skilled Nursing Facility  In-House Referral:  Clinical Social Work  Discharge planning Services  CM Consult  Post Acute Care Choice:  NA Choice offered to:  NA  DME Arranged:    DME Agency:     HH Arranged:    [place] Agency:     Status of Service:  Completed, signed off  Medicare Important Message Given:    Date Medicare IM Given:    Medicare IM give by:    Date Additional Medicare IM Given:    Additional Medicare Important Message give by:     If discussed at [place] of Stay Meetings, dates discussed:    Additional Comments:  Joylene Draft, RN 09/18/2015, 1:25 PM

## 2015-09-18 NOTE — Progress Notes (Signed)
[facility] PROGRESS NOTE  Karen Mccann [id number] DOB: July 19, 1951 DOA: 09/17/2015  PCP: [name], MD  Brief HPI: 64 year old Caucasian female with a past medical history of stroke with residual left-sided weakness who is bedbound. She has a history also of CHF, COPD. She is on chronic anticoagulation with Coumadin. She lives in a skilled nursing facility. She was brought into the hospital due to fever. She had a temperature up to 104F. Apparently had been having a cough and nasal congestion for 4 days. Also had been complaining of dysuria. She was hospitalized for further management.  Past medical history:  Past Medical History  Diagnosis Date  . COPD (chronic obstructive pulmonary disease) ([place])   . Heart failure ([place])   . Hemiplegia ([place])   . Hemiparesis ([place])   . Thyroid disease   . Depression   . Bipolar 1 disorder, depressed ([place])   . Gastroesophageal reflux   . Stroke [facility])     Consultants: None  Procedures: None  Antibiotics: Zosyn 12/4  Subjective: Patient feels well. She did mention some chest pain overnight. Denies any pain currently. No shortness of breath. No nausea, vomiting.  Objective: Vital Signs  Filed Vitals:   09/17/15 0743 09/17/15 1539 09/17/15 2029 09/18/15 0548  BP: 121/69 116/83 123/46 128/64  Pulse: 93 73 82 88  Temp: 100.2 F (37.9 C) 98.2 F (36.8 C) 98.8 F (37.1 C) 99.3 F (37.4 C)  TempSrc: Oral Oral Oral Oral  Resp:  20 23 20   Height: 5\' 6"  (1.676 m)     Weight: 129.275 kg (285 lb)   128.867 kg (284 lb 1.6 oz)  SpO2: 97% 100% 98% 100%    Intake/Output Summary (Last 24 hours) at 09/18/15 0926 Last data filed at 09/17/15 1800  Gross per 24 hour  Intake   1010 ml  Output      0 ml  Net   1010 ml   Filed Weights   09/17/15 0325 09/17/15 0743 09/18/15 0548  Weight: 127.007 kg (280 lb) 129.275 kg (285 lb) 128.867 kg (284 lb 1.6 oz)    General appearance: Awake and alert. No distress. Resp: clear to  auscultation bilaterally Cardio: regular rate and rhythm, S1, S2 normal, no murmur, click, rub or gallop GI: soft, non-tender; bowel sounds normal; no masses,  no organomegaly Extremities: extremities normal, atraumatic, no cyanosis or edema Neurologic: Awake and alert. Left-sided weakness from previous stroke. No other new deficits noted.  Lab Results:  Basic Metabolic Panel:  Recent Labs Lab 09/17/15 0345 09/18/15 0845  NA 138 141  K 4.1 3.5  CL 100* 107  CO2 31 28  GLUCOSE 128* 91  BUN 10 8  CREATININE 0.78 0.64  CALCIUM 8.8* 7.7*   Liver Function Tests:  Recent Labs Lab 09/17/15 0345  AST 25  ALT 24  ALKPHOS 126  BILITOT 0.5  PROT 6.9  ALBUMIN 2.8*   CBC:  Recent Labs Lab 09/17/15 0345 09/18/15 0845  WBC 11.6* 7.1  NEUTROABS 8.6*  --   HGB 12.7 11.4*  HCT 39.5 36.0  MCV 94.3 94.5  PLT 148* 154   CBG: No results for input(s): GLUCAP in the last 168 hours.  Recent Results (from the past 240 hour(s))  Blood Culture (routine x 2)     Status: None (Preliminary result)   Collection Time: 09/17/15  3:55 AM  Result Value Ref Range Status   Specimen Description RIGHT ANTECUBITAL  Final   Special Requests BOTTLES DRAWN AEROBIC AND ANAEROBIC  [facility]  Final   Culture PENDING  Incomplete   Report Status PENDING  Incomplete  Blood Culture (routine x 2)     Status: None (Preliminary result)   Collection Time: 09/17/15  4:00 AM  Result Value Ref Range Status   Specimen Description RIGHT ANTECUBITAL  Final   Special Requests BOTTLES DRAWN AEROBIC AND ANAEROBIC 6CC  Final   Culture PENDING  Incomplete   Report Status PENDING  Incomplete  MRSA PCR Screening     Status: None   Collection Time: 09/17/15  8:44 AM  Result Value Ref Range Status   MRSA by PCR NEGATIVE NEGATIVE Final    Comment:        The GeneXpert MRSA Assay (FDA approved for NASAL specimens only), is one component of a comprehensive MRSA colonization surveillance program. It is not intended to  diagnose MRSA infection nor to guide or monitor treatment for MRSA infections.       Studies/Results: Dg Chest Port 1 View  09/17/2015  CLINICAL DATA:  Fever.  Headache. EXAM: PORTABLE CHEST 1 VIEW COMPARISON:  05/29/2015 FINDINGS: There is mild unchanged left hemidiaphragm elevation and moderate cardiomegaly. Mild central vascular prominence. No large effusions. No confluent airspace consolidation. IMPRESSION: Stable cardiomegaly. Mild chronic appearing central vascular prominence. Electronically Signed   By: Andreas Newport M.D.   On: 09/17/2015 04:21    Medications:  Scheduled: . acyclovir  400 mg Oral TID  . atorvastatin  80 mg Oral q1800  . isosorbide mononitrate  45 mg Oral Daily  . levothyroxine  100 mcg Oral QAC breakfast  . metoprolol tartrate  25 mg Oral BID  . phenytoin  200 mg Oral Daily  . phenytoin  300 mg Oral QHS  . piperacillin-tazobactam (ZOSYN)  IV  3.375 g Intravenous Q8H  . warfarin  5 mg Oral Once  . Warfarin - Pharmacist Dosing Inpatient   Does not apply Q24H   Continuous:   [id number] **OR** acetaminophen, ipratropium-albuterol, ondansetron **OR** ondansetron (ZOFRAN) IV  Assessment/Plan:  Principal Problem:   UTI (lower urinary tract infection) Active Problems:   Seizures (HCC)   COPD (chronic obstructive pulmonary disease) (HCC)   Hx of CABG   Paraplegia (HCC)    Fever Early sepsis could not be ruled out. She is however stable currently. Fever thought to be secondary to UTI. Continue Zosyn for now. Chest x-ray did not show any infiltrates. Influenza PCR is negative. Await blood and urine culture reports. Continue Zosyn for now.  Possible UTI As above. Await cultures.  History of stroke with left hemiplegia Stable. Patient is bedbound. She resides in a skilled nursing facility. She is on warfarin for secondary prophylaxis.  History of coronary artery disease She mentioned chest pain overnight. Troponin was checked which was normal.  Followed by cardiology as outpatient. Had a Myoview stress test in August 2016 without any clear ischemia. Increase the dose of imdur and continue other medical management. Do not anticipate any further workup unless her pain recurs.  On long-term anticoagulation for history of stroke INR is supratherapeutic. Pharmacy to dose warfarin.  History of seizure disorder Patient is on Dilantin.   History of COPD Stable. Continue home medications.  History of hypothyroidism Continue levothyroxine  DVT Prophylaxis: On warfarin    Code Status: Full code  Family Communication: Discussed with the patient. No family at bedside  Disposition Plan: Await improvement. Continue current management.    LOS: 1 day   [place] [facility] Pager [phone] 09/18/2015, 9:26 AM  If 7PM-7AM, please contact night-coverage at [email or web address], password [facility]

## 2015-09-18 NOTE — NC FL2 (Signed)
[place] LEVEL OF CARE SCREENING TOOL     IDENTIFICATION  Patient Name: Karen Mccann Birthdate: 05-28-1951 Sex: female Admission Date (Current Location): 09/17/2015  [place] and [place] Number:   [id number] [place] and Address:  [place] [address], [place]      Provider Number: [id number]  Attending Physician Name and Address:  Bonnielee Haff, MD  Relative Name and Phone Number:       Current Level of Care: Hospital Recommended Level of Care: [place] Prior Approval Number:    Date Approved/Denied:   PASRR Number: [id number]  Discharge Plan: SNF    Current Diagnoses: Patient Active Problem List   Diagnosis Date Noted  . UTI (lower urinary tract infection) 09/17/2015  . Stroke ([place]) 05/09/2015  . Hypothyroid 05/09/2015  . Bipolar 1 disorder ([place]) 05/09/2015  . Seizures ([place]) 05/09/2015  . Hyperlipidemia 05/09/2015  . COPD (chronic obstructive pulmonary disease) ([place]) 05/09/2015  . Depression 05/09/2015  . Anxiety 05/09/2015  . General weakness 05/09/2015  . CAD (coronary artery disease) 05/09/2015  . 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 HTN (hypertension) 05/09/2015  . Chronic pain 05/09/2015  . Esophageal reflux disease 05/09/2015  . Other heart disorders in diseases classified elsewhere 05/09/2015  . Paraplegia ([place]) 05/09/2015    Orientation ACTIVITIES/SOCIAL BLADDER RESPIRATION    Self, Time, Situation, Place    Incontinent O2 (As needed) (2L)  BEHAVIORAL SYMPTOMS/MOOD NEUROLOGICAL BOWEL NUTRITION STATUS  Other (Comment) (n/a)  (n/a) Incontinent Diet (heart healthy)  PHYSICIAN VISITS COMMUNICATION OF NEEDS Height & Weight Skin    Verbally 5\' 6"  (167.6 cm) 284 lbs. Normal          AMBULATORY STATUS RESPIRATION     (non-ambulatory) O2 (As needed) (2L)      Personal Care Assistance Level of Assistance  Bathing, Feeding, Dressing Bathing Assistance: Maximum assistance Feeding assistance:  Limited assistance Dressing Assistance: Maximum assistance      Functional Limitations Info  Sight, Hearing, Speech Sight Info: Adequate Hearing Info: Adequate Speech Info: Adequate       SPECIAL CARE FACTORS FREQUENCY                      Additional Factors Info  Code Status, Allergies Code Status Info: Full code Allergies Info: Latex           Current Medications (09/18/2015):  This is the current hospital active medication list Current Facility-Administered Medications  Medication Dose Route Frequency Provider Last Rate Last Dose  . acetaminophen (TYLENOL) tablet 650 mg  650 mg Oral Q6H PRN [place] Grout, MD   650 mg at 09/17/15 2327   Or  . acetaminophen (TYLENOL) suppository 650 mg  650 mg Rectal Q6H PRN Phillips Grout, MD      . acyclovir (ZOVIRAX) 200 MG capsule 400 mg  400 mg Oral TID Bonnielee Haff, MD   400 mg at 09/17/15 2140  . atorvastatin (LIPITOR) tablet 80 mg  80 mg Oral q1800 Bonnielee Haff, MD   80 mg at 09/17/15 1721  . ipratropium-albuterol (DUONEB) 0.5-2.5 (3) MG/3ML nebulizer solution 3 mL  3 mL Nebulization Q6H PRN Bonnielee Haff, MD      . isosorbide mononitrate (IMDUR) 24 hr tablet 45 mg  45 mg Oral Daily Bonnielee Haff, MD   45 mg at 09/17/15 0959  . levothyroxine (SYNTHROID, LEVOTHROID) tablet 100 mcg  100 mcg Oral QAC breakfast Bonnielee Haff, MD   100 mcg at 09/17/15  [id number]  . metoprolol tartrate (LOPRESSOR) tablet 25 mg  25 mg Oral BID Bonnielee Haff, MD   25 mg at 09/17/15 2140  . ondansetron (ZOFRAN) tablet 4 mg  4 mg Oral Q6H PRN Phillips Grout, MD       Or  . ondansetron [facility]) injection 4 mg  4 mg Intravenous Q6H PRN Phillips Grout, MD      . phenytoin (DILANTIN) ER capsule 200 mg  200 mg Oral Daily Bonnielee Haff, MD   200 mg at 09/17/15 0958  . phenytoin (DILANTIN) ER capsule 300 mg  300 mg Oral QHS Bonnielee Haff, MD   300 mg at 09/17/15 2140  . piperacillin-tazobactam (ZOSYN) IVPB 3.375 g  3.375 g Intravenous Q8H Bonnielee Haff,  MD   3.375 g at 09/18/15 0650  . Warfarin - Pharmacist Dosing Inpatient   Does not apply Q24H Bonnielee Haff, MD   Stopped at 09/17/15 1600     Discharge Medications: Please see discharge summary for a list of discharge medications.  Relevant Imaging Results:  Relevant Lab Results:  Recent Labs    Additional Information    Salome Arnt, [place]

## 2015-09-19 ENCOUNTER — Inpatient Hospital Stay (HOSPITAL_COMMUNITY): Payer: Medicare Other

## 2015-09-19 ENCOUNTER — Encounter (HOSPITAL_COMMUNITY): Payer: Medicare Other

## 2015-09-19 DIAGNOSIS — Z1612 Extended spectrum beta lactamase (ESBL) resistance: Secondary | ICD-10-CM

## 2015-09-19 DIAGNOSIS — A498 Other bacterial infections of unspecified site: Secondary | ICD-10-CM

## 2015-09-19 LAB — URINE CULTURE

## 2015-09-19 LAB — BASIC METABOLIC PANEL
Anion gap: 7 (ref 5–15)
BUN: 7 mg/dL (ref 6–20)
CO2: 30 mmol/L (ref 22–32)
CREATININE: 0.64 mg/dL (ref 0.44–1.00)
Calcium: 8.2 mg/dL — ABNORMAL LOW (ref 8.9–10.3)
Chloride: 106 mmol/L (ref 101–111)
Glucose, Bld: 86 mg/dL (ref 65–99)
POTASSIUM: 3.4 mmol/L — AB (ref 3.5–5.1)
SODIUM: 143 mmol/L (ref 135–145)

## 2015-09-19 LAB — CBC
HEMATOCRIT: 36.7 % (ref 36.0–46.0)
HEMOGLOBIN: 11.4 g/dL — AB (ref 12.0–15.0)
MCH: 29.4 pg (ref 26.0–34.0)
MCHC: 31.1 g/dL (ref 30.0–36.0)
MCV: 94.6 fL (ref 78.0–100.0)
Platelets: 175 10*3/uL (ref 150–400)
RBC: 3.88 MIL/uL (ref 3.87–5.11)
RDW: 20.4 % — AB (ref 11.5–15.5)
WBC: 6.5 10*3/uL (ref 4.0–10.5)

## 2015-09-19 LAB — PROTIME-INR
INR: 2.07 — AB (ref 0.00–1.49)
Prothrombin Time: 23.2 seconds — ABNORMAL HIGH (ref 11.6–15.2)

## 2015-09-19 MED ORDER — SODIUM CHLORIDE 0.9 % IJ SOLN
10.0000 mL | Freq: Two times a day (BID) | INTRAMUSCULAR | Status: DC
  Administered 2015-09-19 – 2015-09-20 (×3): 10 mL

## 2015-09-19 MED ORDER — SODIUM CHLORIDE 0.9 % IJ SOLN
10.0000 mL | INTRAMUSCULAR | Status: DC | PRN
  Administered 2015-09-19: 40 mL
  Filled 2015-09-19: qty 40

## 2015-09-19 MED ORDER — WARFARIN SODIUM 5 MG PO TABS
6.0000 mg | ORAL_TABLET | Freq: Once | ORAL | Status: AC
  Administered 2015-09-19: 6 mg via ORAL
  Filled 2015-09-19: qty 1

## 2015-09-19 MED ORDER — POTASSIUM CHLORIDE CRYS ER 20 MEQ PO TBCR
40.0000 meq | EXTENDED_RELEASE_TABLET | Freq: Once | ORAL | Status: AC
  Administered 2015-09-19: 40 meq via ORAL
  Filled 2015-09-19: qty 2

## 2015-09-19 MED ORDER — ERTAPENEM SODIUM 1 G IJ SOLR
1.0000 g | INTRAMUSCULAR | Status: DC
  Administered 2015-09-19 – 2015-09-20 (×2): 1 g via INTRAVENOUS
  Filled 2015-09-19 (×2): qty 1

## 2015-09-19 NOTE — Progress Notes (Signed)
ANTIBIOTIC CONSULT NOTE - follow up  Pharmacy Consult for Zosyn Indication: rule out sepsis  Allergies  Allergen Reactions  . Latex Other (See Comments)    unknown   Patient Measurements: Height: 5\' 6"  (167.6 cm) Weight: 285 lb 8 oz (129.502 kg) IBW/kg (Calculated) : 59.3  Vital Signs: Temp: 98.2 F (36.8 C) (12/06 0506) Temp Source: Oral (12/06 0506) BP: 132/61 mmHg (12/06 0506) Pulse Rate: 74 (12/06 0506) Intake/Output from previous day: 12/05 0701 - 12/06 0700 In: 600 [P.O.:600] Out: -  Intake/Output from this shift:    Labs:  Recent Labs  09/17/15 0345 09/18/15 0845 09/19/15 0704  WBC 11.6* 7.1 6.5  HGB 12.7 11.4* 11.4*  PLT 148* 154 175  CREATININE 0.78 0.64 0.64   Estimated Creatinine Clearance: 100.6 mL/min (by C-G formula based on Cr of 0.64). No results for input(s): VANCOTROUGH, VANCOPEAK, VANCORANDOM, GENTTROUGH, GENTPEAK, GENTRANDOM, TOBRATROUGH, TOBRAPEAK, TOBRARND, AMIKACINPEAK, AMIKACINTROU, AMIKACIN in the last 72 hours.   Microbiology: Recent Results (from the past 720 hour(s))  Blood Culture (routine x 2)     Status: None (Preliminary result)   Collection Time: 09/17/15  3:55 AM  Result Value Ref Range Status   Specimen Description RIGHT ANTECUBITAL  Final   Special Requests BOTTLES DRAWN AEROBIC AND ANAEROBIC 6CC  Final   Culture NO GROWTH 1 DAY  Final   Report Status PENDING  Incomplete  Blood Culture (routine x 2)     Status: None (Preliminary result)   Collection Time: 09/17/15  4:00 AM  Result Value Ref Range Status   Specimen Description RIGHT ANTECUBITAL  Final   Special Requests BOTTLES DRAWN AEROBIC AND ANAEROBIC 6CC  Final   Culture NO GROWTH 1 DAY  Final   Report Status PENDING  Incomplete  Urine culture     Status: None (Preliminary result)   Collection Time: 09/17/15  4:33 AM  Result Value Ref Range Status   Specimen Description URINE, CATHETERIZED  Final   Special Requests NONE  Final   Culture   Final    >=100,000  COLONIES/mL ESCHERICHIA COLI Performed at [facility]    Report Status PENDING  Incomplete  MRSA PCR Screening     Status: None   Collection Time: 09/17/15  8:44 AM  Result Value Ref Range Status   MRSA by PCR NEGATIVE NEGATIVE Final    Comment:        The GeneXpert MRSA Assay (FDA approved for NASAL specimens only), is one component of a comprehensive MRSA colonization surveillance program. It is not intended to diagnose MRSA infection nor to guide or monitor treatment for MRSA infections.    Medical History: Past Medical History  Diagnosis Date  . COPD (chronic obstructive pulmonary disease) ([place])   . Heart failure ([place])   . Hemiplegia ([place])   . Hemiparesis ([place])   . Thyroid disease   . Depression   . Bipolar 1 disorder, depressed ([place])   . Gastroesophageal reflux   . Stroke [facility])    Anti-infectives    Start     Dose/Rate Route Frequency Ordered Stop   09/17/15 1400  piperacillin-tazobactam (ZOSYN) IVPB 3.375 g     3.375 g 12.5 mL/hr over 240 Minutes Intravenous Every 8 hours 09/17/15 0834     09/17/15 1000  acyclovir (ZOVIRAX) 200 MG capsule 400 mg     400 mg Oral 3 times daily 09/17/15 0852     09/17/15 0415  piperacillin-tazobactam (ZOSYN) IVPB 3.375 g     3.375 g 100  mL/hr over 30 Minutes Intravenous  Once 09/17/15 [id number] 09/17/15 0526     Assessment: 64 yo female h/o CVA with residual left sided hemiplegia, bedbound, chf, copd sent in from her SNF for fever. Pt reports she has had a cough and nasal congestion for 4 days, but also dysuria for over a day. Zosyn started for uti with possible sepsis. Afebrile, Renal stable, WBC OK.  Goal of Therapy:  Eradicate infection.  Plan:  Continue Zosyn 3.375gm IV q8h, EID Deescalate ABX / switch to PO when appropriate Monitor labs, progress and cultures / sensitivities  [place] Crane, Jaeshawn Silvio A 09/19/2015,7:59 AM

## 2015-09-19 NOTE — Progress Notes (Signed)
Peripherally Inserted Central Catheter/Midline Placement  The IV Nurse has discussed with the patient and/or persons authorized to consent for the patient, the purpose of this procedure and the potential benefits and risks involved with this procedure.  The benefits include less needle sticks, lab draws from the catheter and patient may be discharged home with the catheter.  Risks include, but not limited to, infection, bleeding, blood clot (thrombus formation), and puncture of an artery; nerve damage and irregular heat beat.  Alternatives to this procedure were also discussed.  PICC/Midline Placement Documentation  PICC Single Lumen 09/19/15 PICC Right Cephalic 41 cm 0 cm (Active)  Indication for Insertion or Continuance of Line Home intravenous therapies (PICC only) 09/19/2015  2:43 PM  Exposed Catheter (cm) 0 cm 09/19/2015  2:43 PM  Site Assessment Clean;Dry;Intact 09/19/2015  2:43 PM  Line Status Flushed;Saline locked;Blood return noted 09/19/2015  2:43 PM  Dressing Type Transparent;Restrictive 09/19/2015  2:43 PM  Dressing Status Clean;Dry;Intact;Antimicrobial disc in place 09/19/2015  2:43 PM  Line Care Connections checked and tightened 09/19/2015  2:43 PM  Dressing Intervention New dressing 09/19/2015  2:43 PM  Dressing Change Due 09/26/15 09/19/2015  2:43 PM       Hillery Jacks 09/19/2015, 3:00 PM

## 2015-09-19 NOTE — Progress Notes (Signed)
ANTICOAGULATION CONSULT NOTE - follow up  Pharmacy Consult for Coumadin (chronic Rx PTA) Indication: stroke  Allergies  Allergen Reactions  . Latex Other (See Comments)    unknown   Patient Measurements: Height: 5\' 6"  (167.6 cm) Weight: 285 lb 8 oz (129.502 kg) IBW/kg (Calculated) : 59.3  Vital Signs: Temp: 98.2 F (36.8 C) (12/06 0506) Temp Source: Oral (12/06 0506) BP: 132/61 mmHg (12/06 0506) Pulse Rate: 74 (12/06 0506)  Labs:  Recent Labs  09/17/15 0345 09/18/15 0845 09/18/15 1029 09/19/15 0704  HGB 12.7 11.4*  --  11.4*  HCT 39.5 36.0  --  36.7  PLT 148* 154  --  175  LABPROT 34.9* 28.7*  --  23.2*  INR 3.58* 2.76*  --  2.07*  CREATININE 0.78 0.64  --  0.64  TROPONINI  --   --  <0.03  --    Estimated Creatinine Clearance: 100.6 mL/min (by C-G formula based on Cr of 0.64).  Medical History: Past Medical History  Diagnosis Date  . COPD (chronic obstructive pulmonary disease) ([place])   . Heart failure ([place])   . Hemiplegia ([place])   . Hemiparesis ([place])   . Thyroid disease   . Depression   . Bipolar 1 disorder, depressed ([place])   . Gastroesophageal reflux   . Stroke [facility]     Medications:  Prescriptions prior to admission  Medication Sig Dispense Refill Last Dose  . acyclovir (ZOVIRAX) 400 MG tablet Take 400 mg by mouth 2 (two) times daily.    09/16/2015  . aspirin EC 81 MG tablet Take 81 mg by mouth daily.   09/16/2015  . calcium-vitamin D (OSCAL WITH D) 500-200 MG-UNIT per tablet Take 1 tablet by mouth daily with breakfast.   09/17/2015  . cetirizine (ZYRTEC) 10 MG tablet Take 10 mg by mouth daily.   09/16/2015  . Cholecalciferol (VITAMIN D3) 3000 UNITS TABS Take 1,000 Units by mouth daily.   09/16/2015  . DULoxetine (CYMBALTA) 60 MG capsule Take 60 mg by mouth at bedtime.    09/15/2015  . ferrous sulfate 325 (65 FE) MG tablet Take 325 mg by mouth 2 (two) times daily with a meal.   09/16/2015  . furosemide (LASIX) 20 MG tablet Take 20 mg by mouth.   09/16/2015  .  guaiFENesin (MUCINEX) 600 MG 12 hr tablet Take 600 mg by mouth 2 (two) times daily.   09/16/2015  . HYDROcodone-acetaminophen (NORCO/VICODIN) 5-325 MG tablet Take 1 tablet by mouth every 6 (six) hours as needed for moderate pain.   09/15/2015  . hydrOXYzine (ATARAX/VISTARIL) 50 MG tablet Take 50 mg by mouth at bedtime.    09/15/2015  . isosorbide mononitrate (IMDUR) 30 MG 24 hr tablet Take 1.5 tablets (45 mg total) by mouth daily. 135 tablet 3 09/16/2015  . latanoprost (XALATAN) 0.005 % ophthalmic solution Place 1 drop into both eyes at bedtime.   09/16/2015  . levothyroxine (SYNTHROID, LEVOTHROID) 300 MCG tablet Take 300 mcg by mouth daily before breakfast.   09/17/2015  . LORazepam (ATIVAN) 1 MG tablet Take 1 mg by mouth every 6 (six) hours as needed for anxiety.   09/15/2015  . metoprolol tartrate (LOPRESSOR) 25 MG tablet Take 1 tablet (25 mg total) by mouth 2 (two) times daily. 180 tablet 3 09/17/2015 at 0900  . Multiple Vitamins-Minerals (MULTIVITAMIN ADULT PO) Take 1 tablet by mouth daily.   09/16/2015  . nortriptyline (PAMELOR) 50 MG capsule Take 50 mg by mouth at bedtime.   09/16/2015  . omeprazole (  PRILOSEC) 20 MG capsule Take 20 mg by mouth daily.   09/16/2015  . phenytoin (DILANTIN) 100 MG ER capsule Take 200-300 mg by mouth 2 (two) times daily. 2 tabs am,3 tabs at hs   09/16/2015  . pravastatin (PRAVACHOL) 80 MG tablet Take 80 mg by mouth daily.   09/16/2015  . pregabalin (LYRICA) 75 MG capsule Take 75 mg by mouth daily.   09/16/2015  . Propylene Glycol (SYSTANE BALANCE) 0.6 % SOLN Place 1 drop into both eyes 2 (two) times daily. Both eyes   09/16/2015  . senna (SENOKOT) 8.6 MG TABS tablet Take 1 tablet by mouth 2 (two) times daily.   09/16/2015  . vitamin C (ASCORBIC ACID) 500 MG tablet Take 500 mg by mouth 2 (two) times daily.   09/16/2015  . warfarin (COUMADIN) 2.5 MG tablet Take 2.5 mg by mouth daily. Takes with a 3 mg tablet to make 5.5 mg   09/16/2015  . warfarin (COUMADIN) 3 MG tablet Take 3 mg by  mouth daily. Takes with a 2.5 mg tablet to make 5.5 mg   09/16/2015  . acetaminophen (TYLENOL) 325 MG tablet Take 650 mg by mouth every 4 (four) hours as needed.   09/14/2015  . fluticasone (FLONASE) 50 MCG/ACT nasal spray Place 1 spray into both nostrils at bedtime.   09/15/2015  . ipratropium-albuterol (DUONEB) 0.5-2.5 (3) MG/3ML SOLN Take 3 mLs by nebulization 3 (three) times daily as needed.   Unknown  . nitroGLYCERIN (NITROSTAT) 0.4 MG SL tablet Place 0.4 mg under the tongue every 5 (five) minutes as needed for chest pain.   Unknown  . ondansetron (ZOFRAN) 4 MG tablet Take 4 mg by mouth every 8 (eight) hours as needed for nausea or vomiting.   Unknown   Assessment: 64yo female on chronic Coumadin PTA, home dose listed above.  INR is SUPRAtherapeutic on admission but has now trended down to goal range / on low end.  CBC OK, no bleeding reported Goal of Therapy:  INR 2-3 Monitor platelets by anticoagulation protocol: Yes   Plan:  Coumadin 6mg  po today x 1 INR daily Monitor for s/sx of bleeding  Hart Robinsons A 09/19/2015,8:01 AM

## 2015-09-19 NOTE — Progress Notes (Signed)
[facility] PROGRESS NOTE  KENT STULLER [id number] DOB: 1951/02/27 DOA: 09/17/2015  PCP: [name], MD  Brief HPI: 64 year old Caucasian female with a past medical history of stroke with residual left-sided weakness who is bedbound. She has a history also of CHF, COPD. She is on chronic anticoagulation with Coumadin. She lives in a skilled nursing facility. She was brought into the hospital due to fever. She had a temperature up to 104F. Apparently had been having a cough and nasal congestion for 4 days. Also had been complaining of dysuria. She was hospitalized for further management.  Past medical history:  Past Medical History  Diagnosis Date  . COPD (chronic obstructive pulmonary disease) ([place])   . Heart failure ([place])   . Hemiplegia ([place])   . Hemiparesis ([place])   . Thyroid disease   . Depression   . Bipolar 1 disorder, depressed ([place])   . Gastroesophageal reflux   . Stroke [facility])     Consultants: None  Procedures: None  Antibiotics: Zosyn 12/4--12/6 Ertapenem 12/6  Subjective: Patient denies any complaints. No nausea, vomiting. Tolerating her diet.   Objective: Vital Signs  Filed Vitals:   09/18/15 1425 09/18/15 2151 09/19/15 0506 09/19/15 0910  BP:  122/55 132/61   Pulse: 76 85 74   Temp: 98.7 F (37.1 C) 99.8 F (37.7 C) 98.2 F (36.8 C)   TempSrc: Oral Oral Oral   Resp: 20 20 20    Height:      Weight:   129.502 kg (285 lb 8 oz)   SpO2: 93% 100% 100% 100%    Intake/Output Summary (Last 24 hours) at 09/19/15 0940 Last data filed at 09/18/15 1911  Gross per 24 hour  Intake    360 ml  Output      0 ml  Net    360 ml   Filed Weights   09/17/15 0743 09/18/15 0548 09/19/15 0506  Weight: 129.275 kg (285 lb) 128.867 kg (284 lb 1.6 oz) 129.502 kg (285 lb 8 oz)    General appearance: Awake and alert. No distress. Resp: clear to auscultation bilaterally Cardio: regular rate and rhythm, S1, S2 normal, no murmur, click, rub or gallop GI:  soft, non-tender; bowel sounds normal; no masses,  no organomegaly Neurologic: Awake and alert. Left-sided weakness from previous stroke. No other new deficits noted.  Lab Results:  Basic Metabolic Panel:  Recent Labs Lab 09/17/15 0345 09/18/15 0845 09/19/15 0704  NA 138 141 143  K 4.1 3.5 3.4*  CL 100* 107 106  CO2 31 28 30   GLUCOSE 128* 91 86  BUN 10 8 7   CREATININE 0.78 0.64 0.64  CALCIUM 8.8* 7.7* 8.2*   Liver Function Tests:  Recent Labs Lab 09/17/15 0345  AST 25  ALT 24  ALKPHOS 126  BILITOT 0.5  PROT 6.9  ALBUMIN 2.8*   CBC:  Recent Labs Lab 09/17/15 0345 09/18/15 0845 09/19/15 0704  WBC 11.6* 7.1 6.5  NEUTROABS 8.6*  --   --   HGB 12.7 11.4* 11.4*  HCT 39.5 36.0 36.7  MCV 94.3 94.5 94.6  PLT 148* 154 175   CBG: No results for input(s): GLUCAP in the last 168 hours.  Recent Results (from the past 240 hour(s))  Blood Culture (routine x 2)     Status: None (Preliminary result)   Collection Time: 09/17/15  3:55 AM  Result Value Ref Range Status   Specimen Description RIGHT ANTECUBITAL  Final   Special Requests BOTTLES DRAWN AEROBIC AND ANAEROBIC 6CC  Final   Culture NO GROWTH 1 DAY  Final   Report Status PENDING  Incomplete  Blood Culture (routine x 2)     Status: None (Preliminary result)   Collection Time: 09/17/15  4:00 AM  Result Value Ref Range Status   Specimen Description RIGHT ANTECUBITAL  Final   Special Requests BOTTLES DRAWN AEROBIC AND ANAEROBIC 6CC  Final   Culture NO GROWTH 1 DAY  Final   Report Status PENDING  Incomplete  Urine culture     Status: None (Preliminary result)   Collection Time: 09/17/15  4:33 AM  Result Value Ref Range Status   Specimen Description URINE, CATHETERIZED  Final   Special Requests NONE  Final   Culture   Final    >=100,000 COLONIES/mL ESCHERICHIA COLI Performed at [facility]    Report Status PENDING  Incomplete  MRSA PCR Screening     Status: None   Collection Time: 09/17/15  8:44 AM    Result Value Ref Range Status   MRSA by PCR NEGATIVE NEGATIVE Final    Comment:        The GeneXpert MRSA Assay (FDA approved for NASAL specimens only), is one component of a comprehensive MRSA colonization surveillance program. It is not intended to diagnose MRSA infection nor to guide or monitor treatment for MRSA infections.       Studies/Results: No results found.  Medications:  Scheduled: . acyclovir  400 mg Oral TID  . atorvastatin  80 mg Oral q1800  . isosorbide mononitrate  60 mg Oral Daily  . levothyroxine  100 mcg Oral QAC breakfast  . metoprolol tartrate  25 mg Oral BID  . phenytoin  200 mg Oral Daily  . phenytoin  300 mg Oral QHS  . piperacillin-tazobactam (ZOSYN)  IV  3.375 g Intravenous Q8H  . potassium chloride  40 mEq Oral Once  . warfarin  6 mg Oral Once  . Warfarin - Pharmacist Dosing Inpatient   Does not apply Q24H   Continuous:   [id number] **OR** acetaminophen, ipratropium-albuterol, ondansetron **OR** ondansetron (ZOFRAN) IV  Assessment/Plan:  Principal Problem:   UTI (lower urinary tract infection) Active Problems:   Seizures (HCC)   COPD (chronic obstructive pulmonary disease) (HCC)   Hx of CABG   Paraplegia (HCC)    Fever Fever thought to be secondary to UTI. Chest x-ray did not show any infiltrates. Influenza PCR is negative. Blood cultures are negative so far. Urine culture is growing Escherichia coli. Sensitivities suggest ESBL.   UTI with ESBL Escherichia coli with sepsis At the time of admission patient was tachycardic, was febrile with elevated WBC. Due to ESBL patient will need treatment with IV antibiotics. She lives in a skilled nursing facility. Ertapenem may be suitable option for her due to ease of administration/dosage. We will initiate. If she tolerates, PICC line can be placed and she can be discharged back to SNF tomorrow.   History of stroke with left hemiplegia Stable. Patient is bedbound. She resides in a  skilled nursing facility. She is on warfarin for secondary prophylaxis.  History of coronary artery disease She mentioned chest pain on 12/4. Troponin was checked which was normal. Followed by cardiology as outpatient. Had a Myoview stress test in August 2016 without any clear ischemia. Increase the dose of imdur and continue other medical management. Do not anticipate any further workup unless her pain recurs. No further occurrence of pain.  On long-term anticoagulation for history of stroke Pharmacy is dosing warfarin.  History  of seizure disorder Patient is on Dilantin. Stable  History of COPD Stable. Continue home medications.  History of hypothyroidism Continue levothyroxine  DVT Prophylaxis: On warfarin    Code Status: Full code  Family Communication: Discussed with the patient. No family at bedside  Disposition Plan: PICC line will be ordered for later today. If she tolerates ertapenem, she could be discharged back to SNF tomorrow.    LOS: 2 days   [place] Pager [phone] 09/19/2015, 9:40 AM  If 7PM-7AM, please contact night-coverage at [email or web address], password [facility]

## 2015-09-20 DIAGNOSIS — I4891 Unspecified atrial fibrillation: Secondary | ICD-10-CM | POA: Diagnosis not present

## 2015-09-20 DIAGNOSIS — F332 Major depressive disorder, recurrent severe without psychotic features: Secondary | ICD-10-CM | POA: Diagnosis not present

## 2015-09-20 DIAGNOSIS — B009 Herpesviral infection, unspecified: Secondary | ICD-10-CM | POA: Diagnosis not present

## 2015-09-20 DIAGNOSIS — N390 Urinary tract infection, site not specified: Secondary | ICD-10-CM | POA: Diagnosis not present

## 2015-09-20 DIAGNOSIS — I10 Essential (primary) hypertension: Secondary | ICD-10-CM | POA: Diagnosis not present

## 2015-09-20 DIAGNOSIS — I2510 Atherosclerotic heart disease of native coronary artery without angina pectoris: Secondary | ICD-10-CM | POA: Diagnosis not present

## 2015-09-20 DIAGNOSIS — F411 Generalized anxiety disorder: Secondary | ICD-10-CM | POA: Diagnosis not present

## 2015-09-20 DIAGNOSIS — F339 Major depressive disorder, recurrent, unspecified: Secondary | ICD-10-CM | POA: Diagnosis not present

## 2015-09-20 DIAGNOSIS — R293 Abnormal posture: Secondary | ICD-10-CM | POA: Diagnosis not present

## 2015-09-20 DIAGNOSIS — R41841 Cognitive communication deficit: Secondary | ICD-10-CM | POA: Diagnosis not present

## 2015-09-20 DIAGNOSIS — Z7401 Bed confinement status: Secondary | ICD-10-CM | POA: Diagnosis not present

## 2015-09-20 DIAGNOSIS — Z8679 Personal history of other diseases of the circulatory system: Secondary | ICD-10-CM | POA: Diagnosis not present

## 2015-09-20 DIAGNOSIS — Z9981 Dependence on supplemental oxygen: Secondary | ICD-10-CM | POA: Diagnosis not present

## 2015-09-20 DIAGNOSIS — E039 Hypothyroidism, unspecified: Secondary | ICD-10-CM | POA: Diagnosis not present

## 2015-09-20 DIAGNOSIS — I509 Heart failure, unspecified: Secondary | ICD-10-CM | POA: Diagnosis not present

## 2015-09-20 DIAGNOSIS — F3130 Bipolar disorder, current episode depressed, mild or moderate severity, unspecified: Secondary | ICD-10-CM | POA: Diagnosis not present

## 2015-09-20 DIAGNOSIS — Z8673 Personal history of transient ischemic attack (TIA), and cerebral infarction without residual deficits: Secondary | ICD-10-CM | POA: Diagnosis not present

## 2015-09-20 DIAGNOSIS — G8929 Other chronic pain: Secondary | ICD-10-CM | POA: Diagnosis not present

## 2015-09-20 DIAGNOSIS — K219 Gastro-esophageal reflux disease without esophagitis: Secondary | ICD-10-CM | POA: Diagnosis not present

## 2015-09-20 DIAGNOSIS — F319 Bipolar disorder, unspecified: Secondary | ICD-10-CM | POA: Diagnosis not present

## 2015-09-20 DIAGNOSIS — E559 Vitamin D deficiency, unspecified: Secondary | ICD-10-CM | POA: Diagnosis not present

## 2015-09-20 DIAGNOSIS — D689 Coagulation defect, unspecified: Secondary | ICD-10-CM | POA: Diagnosis not present

## 2015-09-20 DIAGNOSIS — F419 Anxiety disorder, unspecified: Secondary | ICD-10-CM | POA: Diagnosis not present

## 2015-09-20 DIAGNOSIS — N182 Chronic kidney disease, stage 2 (mild): Secondary | ICD-10-CM | POA: Diagnosis not present

## 2015-09-20 DIAGNOSIS — F418 Other specified anxiety disorders: Secondary | ICD-10-CM | POA: Diagnosis not present

## 2015-09-20 DIAGNOSIS — Z951 Presence of aortocoronary bypass graft: Secondary | ICD-10-CM | POA: Diagnosis not present

## 2015-09-20 DIAGNOSIS — M6281 Muscle weakness (generalized): Secondary | ICD-10-CM | POA: Diagnosis not present

## 2015-09-20 DIAGNOSIS — T7840XA Allergy, unspecified, initial encounter: Secondary | ICD-10-CM | POA: Diagnosis not present

## 2015-09-20 DIAGNOSIS — Z7901 Long term (current) use of anticoagulants: Secondary | ICD-10-CM | POA: Diagnosis not present

## 2015-09-20 DIAGNOSIS — G40909 Epilepsy, unspecified, not intractable, without status epilepticus: Secondary | ICD-10-CM | POA: Diagnosis not present

## 2015-09-20 DIAGNOSIS — R2681 Unsteadiness on feet: Secondary | ICD-10-CM | POA: Diagnosis not present

## 2015-09-20 DIAGNOSIS — E8809 Other disorders of plasma-protein metabolism, not elsewhere classified: Secondary | ICD-10-CM | POA: Diagnosis not present

## 2015-09-20 DIAGNOSIS — R197 Diarrhea, unspecified: Secondary | ICD-10-CM | POA: Diagnosis not present

## 2015-09-20 DIAGNOSIS — D649 Anemia, unspecified: Secondary | ICD-10-CM | POA: Diagnosis not present

## 2015-09-20 DIAGNOSIS — E785 Hyperlipidemia, unspecified: Secondary | ICD-10-CM | POA: Diagnosis not present

## 2015-09-20 DIAGNOSIS — G8220 Paraplegia, unspecified: Secondary | ICD-10-CM | POA: Diagnosis not present

## 2015-09-20 DIAGNOSIS — I69954 Hemiplegia and hemiparesis following unspecified cerebrovascular disease affecting left non-dominant side: Secondary | ICD-10-CM | POA: Diagnosis not present

## 2015-09-20 LAB — PROTIME-INR
INR: 1.96 — AB (ref 0.00–1.49)
Prothrombin Time: 22.2 seconds — ABNORMAL HIGH (ref 11.6–15.2)

## 2015-09-20 LAB — BASIC METABOLIC PANEL
ANION GAP: 3 — AB (ref 5–15)
BUN: 5 mg/dL — AB (ref 6–20)
CO2: 31 mmol/L (ref 22–32)
Calcium: 7.9 mg/dL — ABNORMAL LOW (ref 8.9–10.3)
Chloride: 108 mmol/L (ref 101–111)
Creatinine, Ser: 0.52 mg/dL (ref 0.44–1.00)
GFR calc Af Amer: >60 mL/min (ref >60)
Glucose, Bld: 93 mg/dL (ref 65–99)
POTASSIUM: 3.6 mmol/L (ref 3.5–5.1)
SODIUM: 142 mmol/L (ref 135–145)

## 2015-09-20 MED ORDER — LEVOTHYROXINE SODIUM 100 MCG PO TABS: 100.0000 ug | ORAL_TABLET | Freq: Every day | ORAL | Status: DC

## 2015-09-20 MED ORDER — WARFARIN SODIUM 5 MG PO TABS: 6.0000 mg | ORAL_TABLET | Freq: Once | ORAL | Status: DC

## 2015-09-20 MED ORDER — SODIUM CHLORIDE 0.9 % IV SOLN: 1.0000 g | INTRAVENOUS | Status: DC

## 2015-09-20 NOTE — Discharge Summary (Signed)
Physician Discharge Summary  Karen Mccann [id number] DOB: 1951-03-04 DOA: 09/17/2015  PCP: [name], MD  Admit date: 09/17/2015 Discharge date: 09/20/2015  Time spent: > 35 minutes  Recommendations for Outpatient Follow-up:  1. Please have picc line removed after completion of antibiotic therapy 2. Pt will need 7 more days of antibiotic therapy 3. Monitor INR levels   Discharge Diagnoses:  Principal Problem:   UTI (lower urinary tract infection) Active Problems:   Seizures (HCC)   COPD (chronic obstructive pulmonary disease) (HCC)   Hx of CABG   Paraplegia [facility])   Discharge Condition: stable  Diet recommendation: Heart healthy  Filed Weights   09/17/15 0743 09/18/15 0548 09/19/15 0506  Weight: 129.275 kg (285 lb) 128.867 kg (284 lb 1.6 oz) 129.502 kg (285 lb 8 oz)    History of present illness:  From original HPI: 64 yo female h/o CVA with residual left sided hemiplegia, bedbound, chf, copd sent in from her SNF for fever. Pt reports she has had a cough and nasal congestion for 4 days, but also dysuria for over a day. Also some suprapubic pain.  Hospital Course:  Fever Fever thought to be secondary to UTI. Chest x-ray did not show any infiltrates. Influenza PCR is negative. Blood cultures are negative so far. Urine culture is growing Escherichia coli. Sensitivities suggest ESBL.   UTI with ESBL Escherichia coli with sepsis PICC line placed. Patient will need 7 more days to complete a total treatment course of 10 days. Picc line may be removed after completion of antibiotics.   History of stroke with left hemiplegia Stable. Patient is bedbound. She resides in a skilled nursing facility. She is on warfarin for secondary prophylaxis.  History of coronary artery disease Had a Myoview stress test in August 2016 without any clear ischemia. Increase the dose of imdur and continue other medical management. No complaints of chest pain on day of d/c  On long-term  anticoagulation for history of stroke On warfarin to be dosed by facility or pcp at facility.  History of seizure disorder Patient is on Dilantin. Stable  History of COPD Stable. Continue home medications.  History of hypothyroidism Continue levothyroxine  Procedures:  None  Consultations:  None  Discharge Exam: Filed Vitals:   09/20/15 0640 09/20/15 0824  BP: 138/73   Pulse: 76 77  Temp: 98.6 F (37 C)   Resp: 18 18    General: Pt in nad, alert and awake Cardiovascular: s1 and s2 wnl, no rubs Respiratory: no increased wob, no wheezes  Discharge Instructions   Discharge Instructions    Call MD for:  persistant dizziness or light-headedness    Complete by:  As directed      Call MD for:  redness, tenderness, or signs of infection (pain, swelling, redness, odor or green/yellow discharge around incision site)    Complete by:  As directed      Call MD for:  temperature >100.4    Complete by:  As directed      Diet - low sodium heart healthy    Complete by:  As directed      Increase activity slowly    Complete by:  As directed           Current Discharge Medication List    START taking these medications   Details  ertapenem 1 g in sodium chloride 0.9 % 50 mL Inject 1 g into the vein daily. Qty: 7 g, Refills: 0      CONTINUE  these medications which have CHANGED   Details  levothyroxine (SYNTHROID, LEVOTHROID) 100 MCG tablet Take 1 tablet (100 mcg total) by mouth daily before breakfast. Qty: 30 tablet, Refills: 0      CONTINUE these medications which have NOT CHANGED   Details  acyclovir (ZOVIRAX) 400 MG tablet Take 400 mg by mouth 2 (two) times daily.     aspirin EC 81 MG tablet Take 81 mg by mouth daily.    calcium-vitamin D (OSCAL WITH D) 500-200 MG-UNIT per tablet Take 1 tablet by mouth daily with breakfast.    cetirizine (ZYRTEC) 10 MG tablet Take 10 mg by mouth daily.    Cholecalciferol (VITAMIN D3) 3000 UNITS TABS Take 1,000 Units by mouth  daily.    DULoxetine (CYMBALTA) 60 MG capsule Take 60 mg by mouth at bedtime.     ferrous sulfate 325 (65 FE) MG tablet Take 325 mg by mouth 2 (two) times daily with a meal.    furosemide (LASIX) 20 MG tablet Take 20 mg by mouth.    guaiFENesin (MUCINEX) 600 MG 12 hr tablet Take 600 mg by mouth 2 (two) times daily.    HYDROcodone-acetaminophen (NORCO/VICODIN) 5-325 MG tablet Take 1 tablet by mouth every 6 (six) hours as needed for moderate pain.    hydrOXYzine (ATARAX/VISTARIL) 50 MG tablet Take 50 mg by mouth at bedtime.     isosorbide mononitrate (IMDUR) 30 MG 24 hr tablet Take 1.5 tablets (45 mg total) by mouth daily. Qty: 135 tablet, Refills: 3    latanoprost (XALATAN) 0.005 % ophthalmic solution Place 1 drop into both eyes at bedtime.    metoprolol tartrate (LOPRESSOR) 25 MG tablet Take 1 tablet (25 mg total) by mouth 2 (two) times daily. Qty: 180 tablet, Refills: 3    Multiple Vitamins-Minerals (MULTIVITAMIN ADULT PO) Take 1 tablet by mouth daily.    nortriptyline (PAMELOR) 50 MG capsule Take 50 mg by mouth at bedtime.    omeprazole (PRILOSEC) 20 MG capsule Take 20 mg by mouth daily.    phenytoin (DILANTIN) 100 MG ER capsule Take 200-300 mg by mouth 2 (two) times daily. 2 tabs am,3 tabs at hs    pravastatin (PRAVACHOL) 80 MG tablet Take 80 mg by mouth daily.    pregabalin (LYRICA) 75 MG capsule Take 75 mg by mouth daily.    Propylene Glycol (SYSTANE BALANCE) 0.6 % SOLN Place 1 drop into both eyes 2 (two) times daily. Both eyes    senna (SENOKOT) 8.6 MG TABS tablet Take 1 tablet by mouth 2 (two) times daily.    vitamin C (ASCORBIC ACID) 500 MG tablet Take 500 mg by mouth 2 (two) times daily.    !! warfarin (COUMADIN) 2.5 MG tablet Take 2.5 mg by mouth daily. Takes with a 3 mg tablet to make 5.5 mg    !! warfarin (COUMADIN) 3 MG tablet Take 3 mg by mouth daily. Takes with a 2.5 mg tablet to make 5.5 mg    acetaminophen (TYLENOL) 325 MG tablet Take 650 mg by mouth every  4 (four) hours as needed.    fluticasone (FLONASE) 50 MCG/ACT nasal spray Place 1 spray into both nostrils at bedtime.    ipratropium-albuterol (DUONEB) 0.5-2.5 (3) MG/3ML SOLN Take 3 mLs by nebulization 3 (three) times daily as needed.    nitroGLYCERIN (NITROSTAT) 0.4 MG SL tablet Place 0.4 mg under the tongue every 5 (five) minutes as needed for chest pain.    ondansetron (ZOFRAN) 4 MG tablet Take 4 mg by mouth  every 8 (eight) hours as needed for nausea or vomiting.     !! - Potential duplicate medications found. Please discuss with provider.    STOP taking these medications     LORazepam (ATIVAN) 1 MG tablet      atorvastatin (LIPITOR) 80 MG tablet        Allergies  Allergen Reactions  . Latex Other (See Comments)    unknown      The results of significant diagnostics from this hospitalization (including imaging, microbiology, ancillary and laboratory) are listed below for reference.    Significant Diagnostic Studies: Dg Chest Port 1 View  09/19/2015  CLINICAL DATA:  PICC placement EXAM: PORTABLE CHEST 1 VIEW COMPARISON:  Two days ago FINDINGS: Right upper extremity PICC with tip approximately 2 cm below the upper cavoatrial junction. Chronic cardiomegaly. Negative aortic and hilar contours. There is no edema, consolidation, effusion, or pneumothorax. No acute osseous finding. Patient is status post CABG. IMPRESSION: Right upper extremity PICC with tip just below the upper cavoatrial junction. Electronically Signed   By: Monte Fantasia M.D.   On: 09/19/2015 15:03   Dg Chest Port 1 View  09/17/2015  CLINICAL DATA:  Fever.  Headache. EXAM: PORTABLE CHEST 1 VIEW COMPARISON:  05/29/2015 FINDINGS: There is mild unchanged left hemidiaphragm elevation and moderate cardiomegaly. Mild central vascular prominence. No large effusions. No confluent airspace consolidation. IMPRESSION: Stable cardiomegaly. Mild chronic appearing central vascular prominence. Electronically Signed   By: Andreas Newport M.D.   On: 09/17/2015 04:21    Microbiology: Recent Results (from the past 240 hour(s))  Blood Culture (routine x 2)     Status: None (Preliminary result)   Collection Time: 09/17/15  3:55 AM  Result Value Ref Range Status   Specimen Description BLOOD RIGHT ANTECUBITAL  Final   Special Requests BOTTLES DRAWN AEROBIC AND ANAEROBIC 6CC  Final   Culture NO GROWTH 3 DAYS  Final   Report Status PENDING  Incomplete  Blood Culture (routine x 2)     Status: None (Preliminary result)   Collection Time: 09/17/15  4:00 AM  Result Value Ref Range Status   Specimen Description BLOOD RIGHT ANTECUBITAL  Final   Special Requests BOTTLES DRAWN AEROBIC AND ANAEROBIC 6CC  Final   Culture NO GROWTH 3 DAYS  Final   Report Status PENDING  Incomplete  Urine culture     Status: None   Collection Time: 09/17/15  4:33 AM  Result Value Ref Range Status   Specimen Description URINE, CATHETERIZED  Final   Special Requests NONE  Final   Culture   Final    >=100,000 COLONIES/mL ESCHERICHIA COLI Confirmed Extended Spectrum Beta-Lactamase Producer (ESBL) Performed at [facility]    Report Status 09/19/2015 FINAL  Final   Organism ID, Bacteria ESCHERICHIA COLI  Final      Susceptibility   Escherichia coli - MIC*    AMPICILLIN >=32 RESISTANT Resistant     CEFAZOLIN >=64 RESISTANT Resistant     CEFTRIAXONE >=64 RESISTANT Resistant     CIPROFLOXACIN >=4 RESISTANT Resistant     GENTAMICIN <=1 SENSITIVE Sensitive     IMIPENEM <=0.25 SENSITIVE Sensitive     NITROFURANTOIN 128 RESISTANT Resistant     TRIMETH/SULFA >=320 RESISTANT Resistant     AMPICILLIN/SULBACTAM 16 INTERMEDIATE Intermediate     PIP/TAZO <=4 SENSITIVE Sensitive     * >=100,000 COLONIES/mL ESCHERICHIA COLI  MRSA PCR Screening     Status: None   Collection Time: 09/17/15  8:44 AM  Result Value Ref Range Status   MRSA by PCR NEGATIVE NEGATIVE Final    Comment:        The GeneXpert MRSA Assay (FDA approved for NASAL  specimens only), is one component of a comprehensive MRSA colonization surveillance program. It is not intended to diagnose MRSA infection nor to guide or monitor treatment for MRSA infections.      Labs: Basic Metabolic Panel:  Recent Labs Lab 09/17/15 0345 09/18/15 0845 09/19/15 0704 09/20/15 0643  NA 138 141 143 142  K 4.1 3.5 3.4* 3.6  CL 100* 107 106 108  CO2 31 28 30 31   GLUCOSE 128* 91 86 93  BUN 10 8 7  5*  CREATININE 0.78 0.64 0.64 0.52  CALCIUM 8.8* 7.7* 8.2* 7.9*   Liver Function Tests:  Recent Labs Lab 09/17/15 0345  AST 25  ALT 24  ALKPHOS 126  BILITOT 0.5  PROT 6.9  ALBUMIN 2.8*   No results for input(s): LIPASE, AMYLASE in the last 168 hours. No results for input(s): AMMONIA in the last 168 hours. CBC:  Recent Labs Lab 09/17/15 0345 09/18/15 0845 09/19/15 0704  WBC 11.6* 7.1 6.5  NEUTROABS 8.6*  --   --   HGB 12.7 11.4* 11.4*  HCT 39.5 36.0 36.7  MCV 94.3 94.5 94.6  PLT 148* 154 175   Cardiac Enzymes:  Recent Labs Lab 09/18/15 1029  TROPONINI <0.03   BNP: BNP (last 3 results) No results for input(s): BNP in the last 8760 hours.  ProBNP (last 3 results) No results for input(s): PROBNP in the last 8760 hours.  CBG: No results for input(s): GLUCAP in the last 168 hours.     Signed:  Velvet Bathe  [facility] 09/20/2015, 1:26 PM

## 2015-09-20 NOTE — Progress Notes (Signed)
Report called to [facility]. 

## 2015-09-20 NOTE — Care Management Important Message (Signed)
Important Message  Patient Details  Name: VICTORINE GOPAL MRN: [id number] Date of Birth: 10/07/1951   Medicare Important Message Given:  Yes    Joylene Draft, RN 09/20/2015, 1:13 PM

## 2015-09-20 NOTE — NC FL2 (Signed)
[place] LEVEL OF CARE SCREENING TOOL     IDENTIFICATION  Patient Name: Karen Mccann Birthdate: [birth year] Sex: female Admission Date (Current Location): 09/17/2015  Holiday and [place] Number:   [id number] [place] and Address:  [facility] [address], [place]      Provider Number: [id number]  Attending Physician Name and Address:  Velvet Bathe, MD  Relative Name and Phone Number:       Current Level of Care: Hospital Recommended Level of Care: [place] Prior Approval Number:    Date Approved/Denied:   PASRR Number: [id number]  Discharge Plan: SNF    Current Diagnoses: Patient Active Problem List   Diagnosis Date Noted  . UTI (lower urinary tract infection) 09/17/2015  . Stroke ([place]) 05/09/2015  . Hypothyroid 05/09/2015  . Bipolar 1 disorder ([place]) 05/09/2015  . Seizures ([place]) 05/09/2015  . Hyperlipidemia 05/09/2015  . COPD (chronic obstructive pulmonary disease) ([place]) 05/09/2015  . Depression 05/09/2015  . Anxiety 05/09/2015  . General weakness 05/09/2015  . CAD (coronary artery disease) 05/09/2015  . 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 HTN (hypertension) 05/09/2015  . Chronic pain 05/09/2015  . Esophageal reflux disease 05/09/2015  . Other heart disorders in diseases classified elsewhere 05/09/2015  . Paraplegia ([place]) 05/09/2015    Orientation ACTIVITIES/SOCIAL BLADDER RESPIRATION    Self, Time, Situation, Place    Incontinent O2 (As needed) (2L)  BEHAVIORAL SYMPTOMS/MOOD NEUROLOGICAL BOWEL NUTRITION STATUS  Other (Comment) (n/a)  (n/a) Incontinent Diet (heart healthy)  PHYSICIAN VISITS COMMUNICATION OF NEEDS Height & Weight Skin    Verbally 5\' 6"  (167.6 cm) 284 lbs. Normal          AMBULATORY STATUS RESPIRATION     (non-ambulatory) O2 (As needed) (2L)      Personal Care Assistance Level of Assistance  Bathing, Feeding, Dressing Bathing Assistance: Maximum assistance Feeding assistance:  Limited assistance Dressing Assistance: Maximum assistance      Functional Limitations Info  Sight, Hearing, Speech Sight Info: Adequate Hearing Info: Adequate Speech Info: Adequate       SPECIAL CARE FACTORS FREQUENCY                      Additional Factors Info  Code Status, Allergies Code Status Info: Full code Allergies Info: Latex           Current Medications (09/20/2015):  This is the current hospital active medication list Current Facility-Administered Medications  Medication Dose Route Frequency Provider Last Rate Last Dose  . acetaminophen (TYLENOL) tablet 650 mg  650 mg Oral Q6H PRN Phillips Grout, MD   650 mg at 09/19/15 2242   Or  . acetaminophen (TYLENOL) suppository 650 mg  650 mg Rectal Q6H PRN Phillips Grout, MD      . acyclovir (ZOVIRAX) 200 MG capsule 400 mg  400 mg Oral TID Bonnielee Haff, MD   400 mg at 09/20/15 0841  . atorvastatin (LIPITOR) tablet 80 mg  80 mg Oral q1800 Bonnielee Haff, MD   80 mg at 09/19/15 1722  . ertapenem (INVANZ) 1 g in sodium chloride 0.9 % 50 mL IVPB  1 g Intravenous Q24H Bonnielee Haff, MD   1 g at 09/19/15 1519  . ipratropium-albuterol (DUONEB) 0.5-2.5 (3) MG/3ML nebulizer solution 3 mL  3 mL Nebulization Q6H PRN Bonnielee Haff, MD      . isosorbide mononitrate (IMDUR) 24 hr tablet 60 mg  60 mg Oral Daily Bonnielee Haff, MD  60 mg at 09/20/15 0841  . levothyroxine (SYNTHROID, LEVOTHROID) tablet 100 mcg  100 mcg Oral QAC breakfast Bonnielee Haff, MD   100 mcg at 09/20/15 0806  . metoprolol tartrate (LOPRESSOR) tablet 25 mg  25 mg Oral BID Bonnielee Haff, MD   25 mg at 09/20/15 0841  . ondansetron (ZOFRAN) tablet 4 mg  4 mg Oral Q6H PRN Phillips Grout, MD       Or  . ondansetron [facility]) injection 4 mg  4 mg Intravenous Q6H PRN Phillips Grout, MD      . phenytoin (DILANTIN) ER capsule 200 mg  200 mg Oral Daily Bonnielee Haff, MD   200 mg at 09/20/15 0841  . phenytoin (DILANTIN) ER capsule 300 mg  300 mg Oral QHS Bonnielee Haff, MD   300 mg at 09/19/15 2228  . sodium chloride 0.9 % injection 10-40 mL  10-40 mL Intracatheter Q12H Bonnielee Haff, MD   10 mL at 09/20/15 1000  . sodium chloride 0.9 % injection 10-40 mL  10-40 mL Intracatheter PRN Bonnielee Haff, MD   40 mL at 09/19/15 1546  . warfarin (COUMADIN) tablet 6 mg  6 mg Oral Once Velvet Bathe, MD      . Warfarin - Pharmacist Dosing Inpatient   Does not apply Q24H Bonnielee Haff, MD         Discharge Medications: Please see discharge summary for a list of discharge medications.  Relevant Imaging Results:  Relevant Lab Results:  Recent Labs    Additional Information PICC line for IV antibiotics.   Benay Pike [place], [place]

## 2015-09-20 NOTE — Clinical Social Work Note (Signed)
Pt d/c today back to [facility]. Pt and facility aware and agreeable. Pt to have PICC line for IV antibiotics. Facility notified. Pt states she does not want CSW to call anyone besides [facility]. Will transport via [employer].  Benay Pike, [place]

## 2015-09-20 NOTE — Progress Notes (Signed)
ANTICOAGULATION CONSULT NOTE - follow up  Pharmacy Consult for Coumadin (chronic Rx PTA) Indication: stroke  Allergies  Allergen Reactions  . Latex Other (See Comments)    unknown   Patient Measurements: Height: 5\' 6"  (167.6 cm) Weight: 285 lb 8 oz (129.502 kg) IBW/kg (Calculated) : 59.3  Vital Signs: Temp: 98.6 F (37 C) (12/07 0640) Temp Source: Oral (12/07 0640) BP: 138/73 mmHg (12/07 0640) Pulse Rate: 77 (12/07 0824)  Labs:  Recent Labs  09/18/15 0845 09/18/15 1029 09/19/15 0704 09/20/15 0643  HGB 11.4*  --  11.4*  --   HCT 36.0  --  36.7  --   PLT 154  --  175  --   LABPROT 28.7*  --  23.2* 22.2*  INR 2.76*  --  2.07* 1.96*  CREATININE 0.64  --  0.64 0.52  TROPONINI  --  <0.03  --   --    Estimated Creatinine Clearance: 100.6 mL/min (by C-G formula based on Cr of 0.52).  Medical History: Past Medical History  Diagnosis Date  . COPD (chronic obstructive pulmonary disease) ([place])   . Heart failure ([place])   . Hemiplegia ([place])   . Hemiparesis ([place])   . Thyroid disease   . Depression   . Bipolar 1 disorder, depressed ([place])   . Gastroesophageal reflux   . Stroke [facility])     Medications:  Prescriptions prior to admission  Medication Sig Dispense Refill Last Dose  . acyclovir (ZOVIRAX) 400 MG tablet Take 400 mg by mouth 2 (two) times daily.    09/16/2015  . aspirin EC 81 MG tablet Take 81 mg by mouth daily.   09/16/2015  . calcium-vitamin D (OSCAL WITH D) 500-200 MG-UNIT per tablet Take 1 tablet by mouth daily with breakfast.   09/17/2015  . cetirizine (ZYRTEC) 10 MG tablet Take 10 mg by mouth daily.   09/16/2015  . Cholecalciferol (VITAMIN D3) 3000 UNITS TABS Take 1,000 Units by mouth daily.   09/16/2015  . DULoxetine (CYMBALTA) 60 MG capsule Take 60 mg by mouth at bedtime.    09/15/2015  . ferrous sulfate 325 (65 FE) MG tablet Take 325 mg by mouth 2 (two) times daily with a meal.   09/16/2015  . furosemide (LASIX) 20 MG tablet Take 20 mg by mouth.   09/16/2015  .  guaiFENesin (MUCINEX) 600 MG 12 hr tablet Take 600 mg by mouth 2 (two) times daily.   09/16/2015  . HYDROcodone-acetaminophen (NORCO/VICODIN) 5-325 MG tablet Take 1 tablet by mouth every 6 (six) hours as needed for moderate pain.   09/15/2015  . hydrOXYzine (ATARAX/VISTARIL) 50 MG tablet Take 50 mg by mouth at bedtime.    09/15/2015  . isosorbide mononitrate (IMDUR) 30 MG 24 hr tablet Take 1.5 tablets (45 mg total) by mouth daily. 135 tablet 3 09/16/2015  . latanoprost (XALATAN) 0.005 % ophthalmic solution Place 1 drop into both eyes at bedtime.   09/16/2015  . levothyroxine (SYNTHROID, LEVOTHROID) 300 MCG tablet Take 300 mcg by mouth daily before breakfast.   09/17/2015  . LORazepam (ATIVAN) 1 MG tablet Take 1 mg by mouth every 6 (six) hours as needed for anxiety.   09/15/2015  . metoprolol tartrate (LOPRESSOR) 25 MG tablet Take 1 tablet (25 mg total) by mouth 2 (two) times daily. 180 tablet 3 09/17/2015 at 0900  . Multiple Vitamins-Minerals (MULTIVITAMIN ADULT PO) Take 1 tablet by mouth daily.   09/16/2015  . nortriptyline (PAMELOR) 50 MG capsule Take 50 mg by mouth at bedtime.  09/16/2015  . omeprazole (PRILOSEC) 20 MG capsule Take 20 mg by mouth daily.   09/16/2015  . phenytoin (DILANTIN) 100 MG ER capsule Take 200-300 mg by mouth 2 (two) times daily. 2 tabs am,3 tabs at hs   09/16/2015  . pravastatin (PRAVACHOL) 80 MG tablet Take 80 mg by mouth daily.   09/16/2015  . pregabalin (LYRICA) 75 MG capsule Take 75 mg by mouth daily.   09/16/2015  . Propylene Glycol (SYSTANE BALANCE) 0.6 % SOLN Place 1 drop into both eyes 2 (two) times daily. Both eyes   09/16/2015  . senna (SENOKOT) 8.6 MG TABS tablet Take 1 tablet by mouth 2 (two) times daily.   09/16/2015  . vitamin C (ASCORBIC ACID) 500 MG tablet Take 500 mg by mouth 2 (two) times daily.   09/16/2015  . warfarin (COUMADIN) 2.5 MG tablet Take 2.5 mg by mouth daily. Takes with a 3 mg tablet to make 5.5 mg   09/16/2015  . warfarin (COUMADIN) 3 MG tablet Take 3 mg by  mouth daily. Takes with a 2.5 mg tablet to make 5.5 mg   09/16/2015  . acetaminophen (TYLENOL) 325 MG tablet Take 650 mg by mouth every 4 (four) hours as needed.   09/14/2015  . fluticasone (FLONASE) 50 MCG/ACT nasal spray Place 1 spray into both nostrils at bedtime.   09/15/2015  . ipratropium-albuterol (DUONEB) 0.5-2.5 (3) MG/3ML SOLN Take 3 mLs by nebulization 3 (three) times daily as needed.   Unknown  . nitroGLYCERIN (NITROSTAT) 0.4 MG SL tablet Place 0.4 mg under the tongue every 5 (five) minutes as needed for chest pain.   Unknown  . ondansetron (ZOFRAN) 4 MG tablet Take 4 mg by mouth every 8 (eight) hours as needed for nausea or vomiting.   Unknown   Assessment: 64yo female on chronic Coumadin PTA, home dose listed above.  INR was supratherapeutic on admission but has now trended down to goal range / on low end and is 1.96 today.  CBC OK, no bleeding reported Goal of Therapy:  INR 2-3 Monitor platelets by anticoagulation protocol: Yes   Plan:  Coumadin 6mg  po today x 1 INR daily Monitor for s/sx of bleeding  Isac Sarna, BS [place], BCPS Clinical Pharmacist Pager [phone] 09/20/2015,8:33 AM

## 2015-09-20 NOTE — Care Management Note (Signed)
Case Management Note  Patient Details  Name: Karen Mccann MRN: [id number] Date of Birth: 02-Jan-1951  Subjective/Objective:                    Action/Plan:   Expected Discharge Date:  09/21/15               Expected Discharge Plan:  Skilled Nursing Facility  In-House Referral:  Clinical Social Work  Discharge planning Services  CM Consult  Post Acute Care Choice:  NA Choice offered to:  NA  DME Arranged:    DME Agency:     HH Arranged:    [place] Agency:     Status of Service:  Completed, signed off  Medicare Important Message Given:  Yes Date Medicare IM Given:    Medicare IM give by:    Date Additional Medicare IM Given:    Additional Medicare Important Message give by:     If discussed at [place] of Stay Meetings, dates discussed:    Additional Comments: Pt discharged back to [facility] today. CSW to arrange discharge to facility. [name] [place], RN 09/20/2015, 1:14 PM

## 2015-09-21 DIAGNOSIS — G40909 Epilepsy, unspecified, not intractable, without status epilepticus: Secondary | ICD-10-CM | POA: Diagnosis not present

## 2015-09-21 DIAGNOSIS — R197 Diarrhea, unspecified: Secondary | ICD-10-CM | POA: Diagnosis not present

## 2015-09-21 DIAGNOSIS — Z8673 Personal history of transient ischemic attack (TIA), and cerebral infarction without residual deficits: Secondary | ICD-10-CM | POA: Diagnosis not present

## 2015-09-21 DIAGNOSIS — D649 Anemia, unspecified: Secondary | ICD-10-CM | POA: Diagnosis not present

## 2015-09-21 DIAGNOSIS — F3130 Bipolar disorder, current episode depressed, mild or moderate severity, unspecified: Secondary | ICD-10-CM | POA: Diagnosis not present

## 2015-09-21 DIAGNOSIS — D689 Coagulation defect, unspecified: Secondary | ICD-10-CM | POA: Diagnosis not present

## 2015-09-21 DIAGNOSIS — N182 Chronic kidney disease, stage 2 (mild): Secondary | ICD-10-CM | POA: Diagnosis not present

## 2015-09-21 LAB — PROTIME-INR: INR: 2 — AB (ref 0.9–1.1)

## 2015-09-22 DIAGNOSIS — Z8673 Personal history of transient ischemic attack (TIA), and cerebral infarction without residual deficits: Secondary | ICD-10-CM | POA: Diagnosis not present

## 2015-09-22 DIAGNOSIS — D649 Anemia, unspecified: Secondary | ICD-10-CM | POA: Diagnosis not present

## 2015-09-22 DIAGNOSIS — G40909 Epilepsy, unspecified, not intractable, without status epilepticus: Secondary | ICD-10-CM | POA: Diagnosis not present

## 2015-09-22 DIAGNOSIS — D689 Coagulation defect, unspecified: Secondary | ICD-10-CM | POA: Diagnosis not present

## 2015-09-22 DIAGNOSIS — F3130 Bipolar disorder, current episode depressed, mild or moderate severity, unspecified: Secondary | ICD-10-CM | POA: Diagnosis not present

## 2015-09-22 DIAGNOSIS — R197 Diarrhea, unspecified: Secondary | ICD-10-CM | POA: Diagnosis not present

## 2015-09-22 DIAGNOSIS — N182 Chronic kidney disease, stage 2 (mild): Secondary | ICD-10-CM | POA: Diagnosis not present

## 2015-09-23 LAB — CULTURE, BLOOD (ROUTINE X 2)
Culture: NO GROWTH
Culture: NO GROWTH

## 2015-09-25 DIAGNOSIS — N390 Urinary tract infection, site not specified: Secondary | ICD-10-CM | POA: Diagnosis not present

## 2015-09-25 DIAGNOSIS — G40909 Epilepsy, unspecified, not intractable, without status epilepticus: Secondary | ICD-10-CM | POA: Diagnosis not present

## 2015-09-25 DIAGNOSIS — Z8673 Personal history of transient ischemic attack (TIA), and cerebral infarction without residual deficits: Secondary | ICD-10-CM | POA: Diagnosis not present

## 2015-09-25 DIAGNOSIS — D689 Coagulation defect, unspecified: Secondary | ICD-10-CM | POA: Diagnosis not present

## 2015-09-28 DIAGNOSIS — A047 Enterocolitis due to Clostridium difficile: Secondary | ICD-10-CM | POA: Diagnosis not present

## 2015-09-28 DIAGNOSIS — D689 Coagulation defect, unspecified: Secondary | ICD-10-CM | POA: Diagnosis not present

## 2015-09-28 DIAGNOSIS — G40909 Epilepsy, unspecified, not intractable, without status epilepticus: Secondary | ICD-10-CM | POA: Diagnosis not present

## 2015-09-28 DIAGNOSIS — Z8673 Personal history of transient ischemic attack (TIA), and cerebral infarction without residual deficits: Secondary | ICD-10-CM | POA: Diagnosis not present

## 2015-10-02 DIAGNOSIS — R569 Unspecified convulsions: Secondary | ICD-10-CM | POA: Diagnosis not present

## 2015-10-02 DIAGNOSIS — Z8673 Personal history of transient ischemic attack (TIA), and cerebral infarction without residual deficits: Secondary | ICD-10-CM | POA: Diagnosis not present

## 2015-10-02 DIAGNOSIS — G40909 Epilepsy, unspecified, not intractable, without status epilepticus: Secondary | ICD-10-CM | POA: Diagnosis not present

## 2015-10-02 DIAGNOSIS — A047 Enterocolitis due to Clostridium difficile: Secondary | ICD-10-CM | POA: Diagnosis not present

## 2015-10-02 DIAGNOSIS — Z7901 Long term (current) use of anticoagulants: Secondary | ICD-10-CM | POA: Diagnosis not present

## 2015-10-02 DIAGNOSIS — D689 Coagulation defect, unspecified: Secondary | ICD-10-CM | POA: Diagnosis not present

## 2015-10-05 DIAGNOSIS — G40909 Epilepsy, unspecified, not intractable, without status epilepticus: Secondary | ICD-10-CM | POA: Diagnosis not present

## 2015-10-05 DIAGNOSIS — I4891 Unspecified atrial fibrillation: Secondary | ICD-10-CM | POA: Diagnosis not present

## 2015-10-05 DIAGNOSIS — D689 Coagulation defect, unspecified: Secondary | ICD-10-CM | POA: Diagnosis not present

## 2015-10-05 DIAGNOSIS — R569 Unspecified convulsions: Secondary | ICD-10-CM | POA: Diagnosis not present

## 2015-10-05 DIAGNOSIS — Z7901 Long term (current) use of anticoagulants: Secondary | ICD-10-CM | POA: Diagnosis not present

## 2015-10-05 DIAGNOSIS — Z8673 Personal history of transient ischemic attack (TIA), and cerebral infarction without residual deficits: Secondary | ICD-10-CM | POA: Diagnosis not present

## 2015-10-05 DIAGNOSIS — A047 Enterocolitis due to Clostridium difficile: Secondary | ICD-10-CM | POA: Diagnosis not present

## 2015-10-05 DIAGNOSIS — Z79899 Other long term (current) drug therapy: Secondary | ICD-10-CM | POA: Diagnosis not present

## 2015-10-09 DIAGNOSIS — G40909 Epilepsy, unspecified, not intractable, without status epilepticus: Secondary | ICD-10-CM | POA: Diagnosis not present

## 2015-10-09 DIAGNOSIS — Z79899 Other long term (current) drug therapy: Secondary | ICD-10-CM | POA: Diagnosis not present

## 2015-10-09 DIAGNOSIS — D689 Coagulation defect, unspecified: Secondary | ICD-10-CM | POA: Diagnosis not present

## 2015-10-09 DIAGNOSIS — A047 Enterocolitis due to Clostridium difficile: Secondary | ICD-10-CM | POA: Diagnosis not present

## 2015-10-09 DIAGNOSIS — I4891 Unspecified atrial fibrillation: Secondary | ICD-10-CM | POA: Diagnosis not present

## 2015-10-09 DIAGNOSIS — Z8673 Personal history of transient ischemic attack (TIA), and cerebral infarction without residual deficits: Secondary | ICD-10-CM | POA: Diagnosis not present

## 2015-10-10 DIAGNOSIS — F332 Major depressive disorder, recurrent severe without psychotic features: Secondary | ICD-10-CM | POA: Diagnosis not present

## 2015-10-10 DIAGNOSIS — F411 Generalized anxiety disorder: Secondary | ICD-10-CM | POA: Diagnosis not present

## 2015-10-13 DIAGNOSIS — I4891 Unspecified atrial fibrillation: Secondary | ICD-10-CM | POA: Diagnosis not present

## 2015-10-13 DIAGNOSIS — G40909 Epilepsy, unspecified, not intractable, without status epilepticus: Secondary | ICD-10-CM | POA: Diagnosis not present

## 2015-10-13 DIAGNOSIS — Z8673 Personal history of transient ischemic attack (TIA), and cerebral infarction without residual deficits: Secondary | ICD-10-CM | POA: Diagnosis not present

## 2015-10-13 DIAGNOSIS — Z7901 Long term (current) use of anticoagulants: Secondary | ICD-10-CM | POA: Diagnosis not present

## 2015-10-13 DIAGNOSIS — I5030 Unspecified diastolic (congestive) heart failure: Secondary | ICD-10-CM | POA: Diagnosis not present

## 2015-10-17 DIAGNOSIS — F332 Major depressive disorder, recurrent severe without psychotic features: Secondary | ICD-10-CM | POA: Diagnosis not present

## 2015-10-17 DIAGNOSIS — F411 Generalized anxiety disorder: Secondary | ICD-10-CM | POA: Diagnosis not present

## 2015-10-20 DIAGNOSIS — I4891 Unspecified atrial fibrillation: Secondary | ICD-10-CM | POA: Diagnosis not present

## 2015-10-20 DIAGNOSIS — Z7901 Long term (current) use of anticoagulants: Secondary | ICD-10-CM | POA: Diagnosis not present

## 2015-10-23 DIAGNOSIS — Z8679 Personal history of other diseases of the circulatory system: Secondary | ICD-10-CM | POA: Diagnosis not present

## 2015-10-24 DIAGNOSIS — F411 Generalized anxiety disorder: Secondary | ICD-10-CM | POA: Diagnosis not present

## 2015-10-24 DIAGNOSIS — F332 Major depressive disorder, recurrent severe without psychotic features: Secondary | ICD-10-CM | POA: Diagnosis not present

## 2015-10-25 DIAGNOSIS — I4891 Unspecified atrial fibrillation: Secondary | ICD-10-CM | POA: Diagnosis not present

## 2015-10-25 DIAGNOSIS — I10 Essential (primary) hypertension: Secondary | ICD-10-CM | POA: Diagnosis not present

## 2015-10-25 DIAGNOSIS — Z8679 Personal history of other diseases of the circulatory system: Secondary | ICD-10-CM | POA: Diagnosis not present

## 2015-10-25 DIAGNOSIS — Z7901 Long term (current) use of anticoagulants: Secondary | ICD-10-CM | POA: Diagnosis not present

## 2015-10-27 DIAGNOSIS — Z7901 Long term (current) use of anticoagulants: Secondary | ICD-10-CM | POA: Diagnosis not present

## 2015-10-27 DIAGNOSIS — I4891 Unspecified atrial fibrillation: Secondary | ICD-10-CM | POA: Diagnosis not present

## 2015-10-29 DIAGNOSIS — M79661 Pain in right lower leg: Secondary | ICD-10-CM | POA: Diagnosis not present

## 2015-10-30 DIAGNOSIS — M79605 Pain in left leg: Secondary | ICD-10-CM | POA: Diagnosis not present

## 2015-10-30 DIAGNOSIS — M79661 Pain in right lower leg: Secondary | ICD-10-CM | POA: Diagnosis not present

## 2015-10-30 DIAGNOSIS — Z7901 Long term (current) use of anticoagulants: Secondary | ICD-10-CM | POA: Diagnosis not present

## 2015-10-30 DIAGNOSIS — I4891 Unspecified atrial fibrillation: Secondary | ICD-10-CM | POA: Diagnosis not present

## 2015-10-31 DIAGNOSIS — I4891 Unspecified atrial fibrillation: Secondary | ICD-10-CM | POA: Diagnosis not present

## 2015-10-31 DIAGNOSIS — Z7901 Long term (current) use of anticoagulants: Secondary | ICD-10-CM | POA: Diagnosis not present

## 2015-11-02 DIAGNOSIS — Z79899 Other long term (current) drug therapy: Secondary | ICD-10-CM | POA: Diagnosis not present

## 2015-11-02 DIAGNOSIS — I4891 Unspecified atrial fibrillation: Secondary | ICD-10-CM | POA: Diagnosis not present

## 2015-11-02 DIAGNOSIS — Z7901 Long term (current) use of anticoagulants: Secondary | ICD-10-CM | POA: Diagnosis not present

## 2015-11-03 DIAGNOSIS — E039 Hypothyroidism, unspecified: Secondary | ICD-10-CM | POA: Diagnosis not present

## 2015-11-06 ENCOUNTER — Ambulatory Visit (INDEPENDENT_AMBULATORY_CARE_PROVIDER_SITE_OTHER): Payer: Medicare Other | Admitting: "Endocrinology

## 2015-11-06 VITALS — BP 140/82 | HR 62

## 2015-11-06 DIAGNOSIS — Z79899 Other long term (current) drug therapy: Secondary | ICD-10-CM | POA: Diagnosis not present

## 2015-11-06 DIAGNOSIS — I4891 Unspecified atrial fibrillation: Secondary | ICD-10-CM | POA: Diagnosis not present

## 2015-11-06 DIAGNOSIS — E041 Nontoxic single thyroid nodule: Secondary | ICD-10-CM | POA: Diagnosis not present

## 2015-11-06 DIAGNOSIS — E038 Other specified hypothyroidism: Secondary | ICD-10-CM

## 2015-11-06 NOTE — Addendum Note (Signed)
Addended by: Cassandria Anger on: 11/06/2015 04:16 PM   Modules accepted: Orders, Medications

## 2015-11-06 NOTE — Progress Notes (Addendum)
Subjective:    Patient ID: Karen Mccann, female    DOB: 03-17-1951, PCP [name], MD   Past Medical History  Diagnosis Date  . COPD (chronic obstructive pulmonary disease) ([place])   . Heart failure ([place])   . Hemiplegia ([place])   . Hemiparesis ([place])   . Thyroid disease   . Depression   . Bipolar 1 disorder, depressed ([place])   . Gastroesophageal reflux   . Stroke [facility])    No past surgical history on file. Social History   Social History  . Marital Status: Divorced    Spouse Name: N/A  . Number of Children: N/A  . Years of Education: N/A   Social History Main Topics  . Smoking status: Former Smoker -- 0.50 packs/day    Types: Cigarettes    Quit date: 05/16/2003  . Smokeless tobacco: Never Used  . Alcohol Use: No  . Drug Use: Not on file  . Sexual Activity: No   Other Topics Concern  . Not on file   Social History Narrative   Outpatient Encounter Prescriptions as of 11/06/2015  Medication Sig  . acetaminophen (TYLENOL) 325 MG tablet Take 650 mg by mouth every 4 (four) hours as needed.  Marland Kitchen acyclovir (ZOVIRAX) 400 MG tablet Take 400 mg by mouth 2 (two) times daily.   Marland Kitchen aspirin EC 81 MG tablet Take 81 mg by mouth daily.  . calcium-vitamin D (OSCAL WITH D) 500-200 MG-UNIT per tablet Take 1 tablet by mouth daily with breakfast.  . cetirizine (ZYRTEC) 10 MG tablet Take 10 mg by mouth daily.  . Cholecalciferol (VITAMIN D3) 3000 UNITS TABS Take 1,000 Units by mouth daily.  . DULoxetine (CYMBALTA) 60 MG capsule Take 60 mg by mouth at bedtime.   . ertapenem 1 g in sodium chloride 0.9 % 50 mL Inject 1 g into the vein daily.  . ferrous sulfate 325 (65 FE) MG tablet Take 325 mg by mouth 2 (two) times daily with a meal.  . fluticasone (FLONASE) 50 MCG/ACT nasal spray Place 1 spray into both nostrils at bedtime.  . furosemide (LASIX) 20 MG tablet Take 20 mg by mouth.  Marland Kitchen guaiFENesin (MUCINEX) 600 MG 12 hr tablet Take 600 mg by mouth 2 (two) times daily.  Marland Kitchen  HYDROcodone-acetaminophen (NORCO/VICODIN) 5-325 MG tablet Take 1 tablet by mouth every 6 (six) hours as needed for moderate pain.  . hydrOXYzine (ATARAX/VISTARIL) 50 MG tablet Take 50 mg by mouth at bedtime.   Marland Kitchen ipratropium-albuterol (DUONEB) 0.5-2.5 (3) MG/3ML SOLN Take 3 mLs by nebulization 3 (three) times daily as needed.  . isosorbide mononitrate (IMDUR) 30 MG 24 hr tablet Take 1.5 tablets (45 mg total) by mouth daily.  Marland Kitchen latanoprost (XALATAN) 0.005 % ophthalmic solution Place 1 drop into both eyes at bedtime.  Marland Kitchen levothyroxine (SYNTHROID, LEVOTHROID) 100 MCG tablet Take 1 tablet (100 mcg total) by mouth daily before breakfast.  . metoprolol tartrate (LOPRESSOR) 25 MG tablet Take 1 tablet (25 mg total) by mouth 2 (two) times daily.  . Multiple Vitamins-Minerals (MULTIVITAMIN ADULT PO) Take 1 tablet by mouth daily.  . nitroGLYCERIN (NITROSTAT) 0.4 MG SL tablet Place 0.4 mg under the tongue every 5 (five) minutes as needed for chest pain.  . nortriptyline (PAMELOR) 50 MG capsule Take 50 mg by mouth at bedtime.  Marland Kitchen omeprazole (PRILOSEC) 20 MG capsule Take 20 mg by mouth daily.  . ondansetron (ZOFRAN) 4 MG tablet Take 4 mg by mouth every 8 (eight) hours as needed for  nausea or vomiting.  . phenytoin (DILANTIN) 100 MG ER capsule Take 200-300 mg by mouth 2 (two) times daily. 2 tabs am,3 tabs at hs  . pravastatin (PRAVACHOL) 80 MG tablet Take 80 mg by mouth daily.  . pregabalin (LYRICA) 75 MG capsule Take 75 mg by mouth daily.  Marland Kitchen Propylene Glycol (SYSTANE BALANCE) 0.6 % SOLN Place 1 drop into both eyes 2 (two) times daily. Both eyes  . senna (SENOKOT) 8.6 MG TABS tablet Take 1 tablet by mouth 2 (two) times daily.  . vitamin C (ASCORBIC ACID) 500 MG tablet Take 500 mg by mouth 2 (two) times daily.  Marland Kitchen warfarin (COUMADIN) 2.5 MG tablet Take 2.5 mg by mouth daily. Takes with a 3 mg tablet to make 5.5 mg  . warfarin (COUMADIN) 3 MG tablet Take 3 mg by mouth daily. Takes with a 2.5 mg tablet to make 5.5 mg    No facility-administered encounter medications on file as of 11/06/2015.   ALLERGIES: Allergies  Allergen Reactions  . Latex Other (See Comments)    unknown   VACCINATION STATUS:  There is no immunization history on file for this patient.  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 She is a nursing home resident at [facility]. She is being seen in consultation for hypothyroidism requested by Dr. [name]. She reports that she underwent thyroidectomy at age 89. She was treated with various doses of thyroid hormone over the years, currently at 200 g by mouth every morning. She is not sure about consistency of his thyroid hormone intake. She reports recent weight gain of 20 pounds over a year, feeling depressed, cold intolerant. She has multiple family members with thyroid dysfunction including her mother, grandmother, and her daughter. She denies any family history of thyroid cancer.  Review of Systems Constitutional:  Wheelchair-bound, + weight gain, + fatigue, +hypothermia Eyes: no blurry vision, no xerophthalmia ENT: no sore throat, no nodules palpated in throat, no dysphagia/odynophagia, no hoarseness Cardiovascular: no CP/SOB/palpitations/, +Left leg swelling Respiratory: no cough/SOB Gastrointestinal: no N/V/D/C Musculoskeletal: + muscle, +joint aches Skin: no rashes Neurological: no tremors/numbness/tingling/dizziness Psychiatric: no depression/anxiety   Objective:    BP 140/82 mmHg  Pulse 62  SpO2 98%  Wt Readings from Last 3 Encounters:  09/19/15 285 lb 8 oz (129.502 kg)  07/18/15 280 lb 1.6 oz (127.053 kg)  05/16/15 298 lb (135.172 kg)    Physical Exam  Constitutional: obese, in NAD Eyes: PERRLA, EOMI, no exophthalmos ENT: moist mucous membranes, old healed thyroidectomy scar on anterior lower neck, thick neck difficult to palpate for the thyroid, no cervical lymphadenopathy Cardiovascular:   STERNOTOMY scar on anterior precordium,RRR, No MRG Respiratory:  bilateral basal rales. Gastrointestinal: abdomen soft, NT, ND, BS+ Musculoskeletal: Wheelchair-bound, left upper and lower extremities edemtous. Power 1 out of 4 on left extremities, Skin: moist, warm, no rashes Neurological: no tremor with outstretched hands.  CMP     Component Value Date/Time   NA 142 09/20/2015 0643   K 3.6 09/20/2015 0643   CL 108 09/20/2015 0643   CO2 31 09/20/2015 0643   GLUCOSE 93 09/20/2015 0643   BUN 5* 09/20/2015 0643   CREATININE 0.52 09/20/2015 0643   CALCIUM 7.9* 09/20/2015 0643   PROT 6.9 09/17/2015 0345   ALBUMIN 2.8* 09/17/2015 0345   AST 25 09/17/2015 0345   ALT 24 09/17/2015 0345   ALKPHOS 126 09/17/2015 0345   BILITOT 0.5 09/17/2015 0345   GFRNONAA >60 09/20/2015 [id number]  GFRAA >60 09/20/2015 0643   On 09/21/2015 her TSH was elevated at 22.79 On 08/28/2015 her TSH was elevated at 17.04   Assessment & Plan:   1.  Hypothyroidism -This is long-standing since at least age 27 per her report. She will need thyroid hormone replacement for life. -Her MAR shows she is currently on 200 g of levothyroxine by mouth every morning since 09/22/2015.  - We discussed about correct intake of levothyroxine, at fasting, with water, separated by at least 30 minutes from breakfast, and separated by more than 4 hours from calcium, iron, multivitamins, acid reflux medications (PPIs). -Patient is made aware of the fact that thyroid hormone replacement is needed for life, dose to be adjusted by periodic monitoring of thyroid function tests.  -I have reviewed her available thyroid workup records. Based on her last 2 TSH determinations she is still profoundly hypothyroid, likely due to inadequate replacement. She is now on  200 g of levothyroxine. However, I will proceed to obtain a new set of thyroid function test including: - T4, free - TSH - Thyroid peroxidase antibody  -She will return in 1 week  to see if adjustment is needed. Given her history of partial thyroidectomy due to goiter and current physical finding of palpable thyroid coupled with difficulty to feel thyroid bed due to thick neck I will proceed to obtain - US Soft Tissue Head/Neck for surveillance.   - I advised patient to maintain close follow up with [name], MD for primary care needs. Follow up plan: Return in about 1 week (around 11/13/2015) for underactive thyroid, labs today, Thyroid Ultrasound.  Glade Lloyd, MD Phone: [phone]  Fax: [phone]   11/06/2015, 9:44 AM

## 2015-11-07 DIAGNOSIS — D649 Anemia, unspecified: Secondary | ICD-10-CM | POA: Diagnosis not present

## 2015-11-07 DIAGNOSIS — E039 Hypothyroidism, unspecified: Secondary | ICD-10-CM | POA: Diagnosis not present

## 2015-11-08 DIAGNOSIS — A047 Enterocolitis due to Clostridium difficile: Secondary | ICD-10-CM | POA: Diagnosis not present

## 2015-11-08 DIAGNOSIS — E041 Nontoxic single thyroid nodule: Secondary | ICD-10-CM | POA: Diagnosis not present

## 2015-11-10 ENCOUNTER — Ambulatory Visit (HOSPITAL_COMMUNITY): Admission: RE | Admit: 2015-11-10 | Payer: Medicare Other | Source: Ambulatory Visit

## 2015-11-10 DIAGNOSIS — E039 Hypothyroidism, unspecified: Secondary | ICD-10-CM | POA: Diagnosis not present

## 2015-11-10 DIAGNOSIS — Z7901 Long term (current) use of anticoagulants: Secondary | ICD-10-CM | POA: Diagnosis not present

## 2015-11-13 DIAGNOSIS — Z7901 Long term (current) use of anticoagulants: Secondary | ICD-10-CM | POA: Diagnosis not present

## 2015-11-13 DIAGNOSIS — I4891 Unspecified atrial fibrillation: Secondary | ICD-10-CM | POA: Diagnosis not present

## 2015-11-15 ENCOUNTER — Encounter: Payer: Self-pay | Admitting: "Endocrinology

## 2015-11-15 ENCOUNTER — Ambulatory Visit (INDEPENDENT_AMBULATORY_CARE_PROVIDER_SITE_OTHER): Payer: Medicare Other | Admitting: "Endocrinology

## 2015-11-15 VITALS — BP 100/70 | HR 79 | Ht 66.0 in

## 2015-11-15 DIAGNOSIS — E042 Nontoxic multinodular goiter: Secondary | ICD-10-CM

## 2015-11-15 DIAGNOSIS — E038 Other specified hypothyroidism: Secondary | ICD-10-CM | POA: Diagnosis not present

## 2015-11-15 NOTE — Progress Notes (Signed)
Subjective:    Patient ID: Karen Mccann, female    DOB: 10/29/1950, PCP [name], MD   Past Medical History  Diagnosis Date  . COPD (chronic obstructive pulmonary disease) ([place])   . Heart failure ([place])   . Hemiplegia ([place])   . Hemiparesis ([place])   . Thyroid disease   . Depression   . Bipolar 1 disorder, depressed ([place])   . Gastroesophageal reflux   . Stroke [facility])    History reviewed. No pertinent past surgical history. Social History   Social History  . Marital Status: Divorced    Spouse Name: N/A  . Number of Children: N/A  . Years of Education: N/A   Social History Main Topics  . Smoking status: Former Smoker -- 0.50 packs/day    Types: Cigarettes    Quit date: 05/16/2003  . Smokeless tobacco: Never Used  . Alcohol Use: No  . Drug Use: None  . Sexual Activity: No   Other Topics Concern  . None   Social History Narrative   Outpatient Encounter Prescriptions as of 11/15/2015  Medication Sig  . acetaminophen (TYLENOL) 325 MG tablet Take 650 mg by mouth every 4 (four) hours as needed.  Marland Kitchen acyclovir (ZOVIRAX) 400 MG tablet Take 400 mg by mouth 2 (two) times daily.   Marland Kitchen aspirin EC 81 MG tablet Take 81 mg by mouth daily.  . calcium-vitamin D (OSCAL WITH D) 500-200 MG-UNIT per tablet Take 1 tablet by mouth daily with breakfast.  . cetirizine (ZYRTEC) 10 MG tablet Take 10 mg by mouth daily.  . Cholecalciferol (VITAMIN D3) 3000 UNITS TABS Take 1,000 Units by mouth daily.  . DULoxetine (CYMBALTA) 60 MG capsule Take 60 mg by mouth at bedtime.   . ertapenem 1 g in sodium chloride 0.9 % 50 mL Inject 1 g into the vein daily.  . ferrous sulfate 325 (65 FE) MG tablet Take 325 mg by mouth 2 (two) times daily with a meal.  . fluticasone (FLONASE) 50 MCG/ACT nasal spray Place 1 spray into both nostrils at bedtime.  . furosemide (LASIX) 20 MG tablet Take 20 mg by mouth.  Marland Kitchen guaiFENesin (MUCINEX) 600 MG 12 hr tablet Take 600 mg by mouth 2 (two) times daily.  Marland Kitchen  HYDROcodone-acetaminophen (NORCO/VICODIN) 5-325 MG tablet Take 1 tablet by mouth every 6 (six) hours as needed for moderate pain.  . hydrOXYzine (ATARAX/VISTARIL) 50 MG tablet Take 50 mg by mouth at bedtime.   Marland Kitchen ipratropium-albuterol (DUONEB) 0.5-2.5 (3) MG/3ML SOLN Take 3 mLs by nebulization 3 (three) times daily as needed.  . isosorbide mononitrate (IMDUR) 30 MG 24 hr tablet Take 1.5 tablets (45 mg total) by mouth daily.  Marland Kitchen latanoprost (XALATAN) 0.005 % ophthalmic solution Place 1 drop into both eyes at bedtime.  Marland Kitchen levothyroxine (SYNTHROID, LEVOTHROID) 200 MCG tablet Take 200 mcg by mouth daily before breakfast.  . metoprolol tartrate (LOPRESSOR) 25 MG tablet Take 1 tablet (25 mg total) by mouth 2 (two) times daily.  . Multiple Vitamins-Minerals (MULTIVITAMIN ADULT PO) Take 1 tablet by mouth daily.  . nitroGLYCERIN (NITROSTAT) 0.4 MG SL tablet Place 0.4 mg under the tongue every 5 (five) minutes as needed for chest pain.  . nortriptyline (PAMELOR) 50 MG capsule Take 50 mg by mouth at bedtime.  Marland Kitchen omeprazole (PRILOSEC) 20 MG capsule Take 20 mg by mouth daily.  . ondansetron (ZOFRAN) 4 MG tablet Take 4 mg by mouth every 8 (eight) hours as needed for nausea or vomiting.  . phenytoin (  DILANTIN) 100 MG ER capsule Take 200-300 mg by mouth 2 (two) times daily. 2 tabs am,3 tabs at hs  . pravastatin (PRAVACHOL) 80 MG tablet Take 80 mg by mouth daily.  . pregabalin (LYRICA) 75 MG capsule Take 75 mg by mouth daily.  Marland Kitchen Propylene Glycol (SYSTANE BALANCE) 0.6 % SOLN Place 1 drop into both eyes 2 (two) times daily. Both eyes  . senna (SENOKOT) 8.6 MG TABS tablet Take 1 tablet by mouth 2 (two) times daily.  . vitamin C (ASCORBIC ACID) 500 MG tablet Take 500 mg by mouth 2 (two) times daily.  Marland Kitchen warfarin (COUMADIN) 2.5 MG tablet Take 2.5 mg by mouth daily. Takes with a 3 mg tablet to make 5.5 mg  . warfarin (COUMADIN) 3 MG tablet Take 3 mg by mouth daily. Takes with a 2.5 mg tablet to make 5.5 mg   No  facility-administered encounter medications on file as of 11/15/2015.   ALLERGIES: Allergies  Allergen Reactions  . Latex Other (See Comments)    unknown   VACCINATION STATUS:  There is no immunization history on file for this patient.  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 She is a nursing home resident at [facility]. She is being seen in in follow-up with new set of thyroid function test and thyroid ultrasound after being seen in consultation for hypothyroidism. She reports that she underwent thyroidectomy at age 79. She was treated with various doses of thyroid hormone over the years, currently at 200 g by mouth every morning.  She reports recent weight gain of 20 pounds over a year, feeling depressed, cold intolerant. She has multiple family members with thyroid dysfunction including her mother, grandmother, and her daughter. She denies any family history of thyroid cancer.  Review of Systems Constitutional:  Wheelchair-bound, + weight gain, + fatigue, +hypothermia Eyes: no blurry vision, no xerophthalmia ENT: no sore throat, no nodules palpated in throat, no dysphagia/odynophagia, no hoarseness Cardiovascular: no CP/SOB/palpitations/, +Left leg swelling Respiratory: no cough/SOB Gastrointestinal: no N/V/D/C Musculoskeletal: + muscle, +joint aches Skin: no rashes Neurological: no tremors/numbness/tingling/dizziness Psychiatric: no depression/anxiety   Objective:    BP 100/70 mmHg  Pulse 79  Ht 5\' 6"  (1.676 m)  SpO2 95%  Wt Readings from Last 3 Encounters:  09/19/15 285 lb 8 oz (129.502 kg)  07/18/15 280 lb 1.6 oz (127.053 kg)  05/16/15 298 lb (135.172 kg)    Physical Exam  Constitutional: obese, in NAD Eyes: PERRLA, EOMI, no exophthalmos ENT: moist mucous membranes, old healed thyroidectomy scar on anterior lower neck, thick neck difficult to palpate for the thyroid, no cervical  lymphadenopathy Cardiovascular:  STERNOTOMY scar on anterior precordium,RRR, No MRG Respiratory:  bilateral basal rales. Gastrointestinal: abdomen soft, NT, ND, BS+ Musculoskeletal: Wheelchair-bound, left upper and lower extremities edematous. Power 1 out of 4 on left extremities, Skin: moist, warm, no rashes Neurological: no tremor with outstretched hands.  CMP     Component Value Date/Time   NA 142 09/20/2015 0643   K 3.6 09/20/2015 0643   CL 108 09/20/2015 0643   CO2 31 09/20/2015 0643   GLUCOSE 93 09/20/2015 0643   BUN 5* 09/20/2015 0643   CREATININE 0.52 09/20/2015 0643   CALCIUM 7.9* 09/20/2015 0643   PROT 6.9 09/17/2015 0345   ALBUMIN 2.8* 09/17/2015 0345   AST 25 09/17/2015 0345   ALT 24 09/17/2015 0345   ALKPHOS 126 09/17/2015 0345   BILITOT 0.5 09/17/2015 0345   GFRNONAA >60 09/20/2015 [id number]  GFRAA >60 09/20/2015 0643   On general 24th 2017: Her TSH was better at 5.32 along with free T4 1.24. -From December and November her TSH where 22.79 and 17.04 respectively.  Assessment & Plan:   1.  Hypothyroidism 2. Ultimately nodular goiter -Hypothyroidism is long-standing problem for her since  at least age 55 per her report. She will need thyroid hormone replacement for life. -Her most recent thyroid function tests are showing improvement. I advised her to continue levothyroxine 200 g by mouth every morning.   - We discussed about correct intake of levothyroxine, at fasting, with water, separated by at least 30 minutes from breakfast, and separated by more than 4 hours from calcium, iron, multivitamins, acid reflux medications (PPIs). -Patient is made aware of the fact that thyroid hormone replacement is needed for life, dose to be adjusted by periodic monitoring of thyroid function tests.   -Her thyroid ultrasound is abnormal. Right lobe measures 4.5 x 2.1 cm with 1 cm complex nodule and left lobe 3.1 x 2.2 cm with 2.6 x 2.3 cm complex nodule. -This will need fine  needle aspiration under ultrasound guidance. I will send her to monitor in Hospital radiology for the same. -She will return in 2 weeks with biopsy results.  - I advised patient to maintain close follow up with [name], MD for primary care needs. Follow up plan: Return in about 2 weeks (around 11/29/2015) for underactive thyroid, biopsy results.  Glade Lloyd, MD Phone: [phone]  Fax: [phone]   11/15/2015, 1:25 PM

## 2015-11-20 DIAGNOSIS — I4891 Unspecified atrial fibrillation: Secondary | ICD-10-CM | POA: Diagnosis not present

## 2015-11-20 DIAGNOSIS — Z7901 Long term (current) use of anticoagulants: Secondary | ICD-10-CM | POA: Diagnosis not present

## 2015-11-21 DIAGNOSIS — F411 Generalized anxiety disorder: Secondary | ICD-10-CM | POA: Diagnosis not present

## 2015-11-21 DIAGNOSIS — F332 Major depressive disorder, recurrent severe without psychotic features: Secondary | ICD-10-CM | POA: Diagnosis not present

## 2015-11-23 ENCOUNTER — Encounter: Payer: Self-pay | Admitting: "Endocrinology

## 2015-11-23 ENCOUNTER — Ambulatory Visit (HOSPITAL_COMMUNITY): Admission: RE | Admit: 2015-11-23 | Payer: Medicare Other | Source: Ambulatory Visit

## 2015-11-28 DIAGNOSIS — F332 Major depressive disorder, recurrent severe without psychotic features: Secondary | ICD-10-CM | POA: Diagnosis not present

## 2015-11-28 DIAGNOSIS — F411 Generalized anxiety disorder: Secondary | ICD-10-CM | POA: Diagnosis not present

## 2015-11-29 ENCOUNTER — Ambulatory Visit: Payer: Medicare Other | Admitting: "Endocrinology

## 2015-11-29 DIAGNOSIS — R569 Unspecified convulsions: Secondary | ICD-10-CM | POA: Diagnosis not present

## 2015-11-29 DIAGNOSIS — I4891 Unspecified atrial fibrillation: Secondary | ICD-10-CM | POA: Diagnosis not present

## 2015-11-29 DIAGNOSIS — Z7901 Long term (current) use of anticoagulants: Secondary | ICD-10-CM | POA: Diagnosis not present

## 2015-12-04 DIAGNOSIS — G40909 Epilepsy, unspecified, not intractable, without status epilepticus: Secondary | ICD-10-CM | POA: Diagnosis not present

## 2015-12-04 DIAGNOSIS — I4891 Unspecified atrial fibrillation: Secondary | ICD-10-CM | POA: Diagnosis not present

## 2015-12-04 DIAGNOSIS — R569 Unspecified convulsions: Secondary | ICD-10-CM | POA: Diagnosis not present

## 2015-12-04 DIAGNOSIS — Z8673 Personal history of transient ischemic attack (TIA), and cerebral infarction without residual deficits: Secondary | ICD-10-CM | POA: Diagnosis not present

## 2015-12-04 DIAGNOSIS — Z7901 Long term (current) use of anticoagulants: Secondary | ICD-10-CM | POA: Diagnosis not present

## 2015-12-05 DIAGNOSIS — F332 Major depressive disorder, recurrent severe without psychotic features: Secondary | ICD-10-CM | POA: Diagnosis not present

## 2015-12-05 DIAGNOSIS — F411 Generalized anxiety disorder: Secondary | ICD-10-CM | POA: Diagnosis not present

## 2015-12-11 DIAGNOSIS — Z7901 Long term (current) use of anticoagulants: Secondary | ICD-10-CM | POA: Diagnosis not present

## 2015-12-11 DIAGNOSIS — I4891 Unspecified atrial fibrillation: Secondary | ICD-10-CM | POA: Diagnosis not present

## 2015-12-12 DIAGNOSIS — F332 Major depressive disorder, recurrent severe without psychotic features: Secondary | ICD-10-CM | POA: Diagnosis not present

## 2015-12-12 DIAGNOSIS — F411 Generalized anxiety disorder: Secondary | ICD-10-CM | POA: Diagnosis not present

## 2015-12-13 DIAGNOSIS — N390 Urinary tract infection, site not specified: Secondary | ICD-10-CM | POA: Diagnosis not present

## 2015-12-13 DIAGNOSIS — D649 Anemia, unspecified: Secondary | ICD-10-CM | POA: Diagnosis not present

## 2015-12-13 DIAGNOSIS — I2510 Atherosclerotic heart disease of native coronary artery without angina pectoris: Secondary | ICD-10-CM | POA: Diagnosis not present

## 2015-12-13 DIAGNOSIS — I4891 Unspecified atrial fibrillation: Secondary | ICD-10-CM | POA: Diagnosis not present

## 2015-12-13 DIAGNOSIS — Z79899 Other long term (current) drug therapy: Secondary | ICD-10-CM | POA: Diagnosis not present

## 2015-12-13 DIAGNOSIS — R569 Unspecified convulsions: Secondary | ICD-10-CM | POA: Diagnosis not present

## 2015-12-18 DIAGNOSIS — I4891 Unspecified atrial fibrillation: Secondary | ICD-10-CM | POA: Diagnosis not present

## 2015-12-18 DIAGNOSIS — Z7901 Long term (current) use of anticoagulants: Secondary | ICD-10-CM | POA: Diagnosis not present

## 2015-12-18 DIAGNOSIS — R569 Unspecified convulsions: Secondary | ICD-10-CM | POA: Diagnosis not present

## 2015-12-19 DIAGNOSIS — F332 Major depressive disorder, recurrent severe without psychotic features: Secondary | ICD-10-CM | POA: Diagnosis not present

## 2015-12-19 DIAGNOSIS — F411 Generalized anxiety disorder: Secondary | ICD-10-CM | POA: Diagnosis not present

## 2015-12-21 ENCOUNTER — Ambulatory Visit (HOSPITAL_COMMUNITY)
Admission: RE | Admit: 2015-12-21 | Discharge: 2015-12-21 | Disposition: A | Discharge status: Home / Self Care | Payer: Medicare Other | Source: Ambulatory Visit | Attending: "Endocrinology | Admitting: "Endocrinology

## 2015-12-21 DIAGNOSIS — E042 Nontoxic multinodular goiter: Secondary | ICD-10-CM

## 2015-12-21 DIAGNOSIS — E038 Other specified hypothyroidism: Secondary | ICD-10-CM

## 2015-12-21 DIAGNOSIS — E041 Nontoxic single thyroid nodule: Secondary | ICD-10-CM | POA: Diagnosis not present

## 2015-12-25 DIAGNOSIS — Z8673 Personal history of transient ischemic attack (TIA), and cerebral infarction without residual deficits: Secondary | ICD-10-CM | POA: Diagnosis not present

## 2015-12-25 DIAGNOSIS — R569 Unspecified convulsions: Secondary | ICD-10-CM | POA: Diagnosis not present

## 2015-12-25 DIAGNOSIS — Z7901 Long term (current) use of anticoagulants: Secondary | ICD-10-CM | POA: Diagnosis not present

## 2015-12-25 DIAGNOSIS — G40909 Epilepsy, unspecified, not intractable, without status epilepticus: Secondary | ICD-10-CM | POA: Diagnosis not present

## 2015-12-25 DIAGNOSIS — I4891 Unspecified atrial fibrillation: Secondary | ICD-10-CM | POA: Diagnosis not present

## 2015-12-26 DIAGNOSIS — F411 Generalized anxiety disorder: Secondary | ICD-10-CM | POA: Diagnosis not present

## 2015-12-26 DIAGNOSIS — F332 Major depressive disorder, recurrent severe without psychotic features: Secondary | ICD-10-CM | POA: Diagnosis not present

## 2015-12-29 ENCOUNTER — Ambulatory Visit (HOSPITAL_COMMUNITY): Admission: RE | Admit: 2015-12-29 | Payer: Medicare Other | Source: Ambulatory Visit

## 2015-12-29 ENCOUNTER — Ambulatory Visit (HOSPITAL_COMMUNITY)
Admission: RE | Admit: 2015-12-29 | Discharge: 2015-12-29 | Disposition: A | Discharge status: Home / Self Care | Payer: Medicare Other | Source: Ambulatory Visit | Attending: "Endocrinology | Admitting: "Endocrinology

## 2015-12-29 ENCOUNTER — Other Ambulatory Visit (HOSPITAL_COMMUNITY): Payer: Medicare Other

## 2015-12-29 NOTE — Progress Notes (Signed)
Spoke with patients nurse and stated she did not want to have the thyroid biopsy today and her blood thinners were not held. Nurse at [facility] advised that he would contact the patients PCP and try to discuss a plan for the biopsy.

## 2016-01-02 ENCOUNTER — Ambulatory Visit: Payer: Medicare Other | Admitting: "Endocrinology

## 2016-01-02 DIAGNOSIS — R569 Unspecified convulsions: Secondary | ICD-10-CM | POA: Diagnosis not present

## 2016-01-02 DIAGNOSIS — Z7901 Long term (current) use of anticoagulants: Secondary | ICD-10-CM | POA: Diagnosis not present

## 2016-01-02 DIAGNOSIS — I4891 Unspecified atrial fibrillation: Secondary | ICD-10-CM | POA: Diagnosis not present

## 2016-01-03 DIAGNOSIS — I4891 Unspecified atrial fibrillation: Secondary | ICD-10-CM | POA: Diagnosis not present

## 2016-01-03 DIAGNOSIS — Z7901 Long term (current) use of anticoagulants: Secondary | ICD-10-CM | POA: Diagnosis not present

## 2016-01-09 DIAGNOSIS — F332 Major depressive disorder, recurrent severe without psychotic features: Secondary | ICD-10-CM | POA: Diagnosis not present

## 2016-01-09 DIAGNOSIS — F411 Generalized anxiety disorder: Secondary | ICD-10-CM | POA: Diagnosis not present

## 2016-01-10 ENCOUNTER — Inpatient Hospital Stay (HOSPITAL_COMMUNITY): Admission: RE | Admit: 2016-01-10 | Payer: Medicare Other | Source: Ambulatory Visit

## 2016-01-12 DIAGNOSIS — Z7901 Long term (current) use of anticoagulants: Secondary | ICD-10-CM | POA: Diagnosis not present

## 2016-01-12 DIAGNOSIS — I4891 Unspecified atrial fibrillation: Secondary | ICD-10-CM | POA: Diagnosis not present

## 2016-01-16 DIAGNOSIS — H401134 Primary open-angle glaucoma, bilateral, indeterminate stage: Secondary | ICD-10-CM | POA: Diagnosis not present

## 2016-01-16 DIAGNOSIS — Z7951 Long term (current) use of inhaled steroids: Secondary | ICD-10-CM | POA: Diagnosis not present

## 2016-01-16 DIAGNOSIS — H2513 Age-related nuclear cataract, bilateral: Secondary | ICD-10-CM | POA: Diagnosis not present

## 2016-01-16 DIAGNOSIS — H04123 Dry eye syndrome of bilateral lacrimal glands: Secondary | ICD-10-CM | POA: Diagnosis not present

## 2016-01-17 ENCOUNTER — Encounter: Payer: Self-pay | Admitting: Cardiology

## 2016-01-17 ENCOUNTER — Ambulatory Visit (INDEPENDENT_AMBULATORY_CARE_PROVIDER_SITE_OTHER): Payer: Medicare Other | Admitting: Cardiology

## 2016-01-17 VITALS — BP 122/76 | HR 84 | Ht 69.0 in | Wt 273.0 lb

## 2016-01-17 DIAGNOSIS — I2510 Atherosclerotic heart disease of native coronary artery without angina pectoris: Secondary | ICD-10-CM | POA: Diagnosis not present

## 2016-01-17 NOTE — Progress Notes (Signed)
Patient ID: Karen Mccann, female   DOB: [birth year], 65 y.o.   MRN: [id number]     Clinical Summary Karen Mccann is a 65 y.o.female seen today for follow up of the following medical problems.    1. Chest pain -  history of CAD with prior CABG. She reports [facility] at [facility] in [facility]. Carl[facility] Adam MPI 05/2015 without clear ischemia, probable soft tissue attenuation. Overall low risk - last visit we increased imdur to 45mg  daily. No recent chest pain since last visit.    2. History of CVA - appears she has been on coumadin since her stroke in 2005   3. Hypothyroidism - followed by endocrine     Past Medical History  Diagnosis Date  . COPD (chronic obstructive pulmonary disease) ([place])   . Heart failure ([place])   . Hemiplegia ([place])   . Hemiparesis ([place])   . Thyroid disease   . Depression   . Bipolar 1 disorder, depressed ([place])   . Gastroesophageal reflux   . Stroke [facility])      Allergies  Allergen Reactions  . Latex Other (See Comments)    unknown     Current Outpatient Prescriptions  Medication Sig Dispense Refill  . acetaminophen (TYLENOL) 325 MG tablet Take 650 mg by mouth every 4 (four) hours as needed.    Marland Kitchen acyclovir (ZOVIRAX) 400 MG tablet Take 400 mg by mouth 2 (two) times daily.     Marland Kitchen aspirin EC 81 MG tablet Take 81 mg by mouth daily.    . calcium-vitamin D (OSCAL WITH D) 500-200 MG-UNIT per tablet Take 1 tablet by mouth daily with breakfast.    . cetirizine (ZYRTEC) 10 MG tablet Take 10 mg by mouth daily.    . Cholecalciferol (VITAMIN D3) 3000 UNITS TABS Take 1,000 Units by mouth daily.    . DULoxetine (CYMBALTA) 60 MG capsule Take 60 mg by mouth at bedtime.     . ertapenem 1 g in sodium chloride 0.9 % 50 mL Inject 1 g into the vein daily. 7 g 0  . ferrous sulfate 325 (65 FE) MG tablet Take 325 mg by mouth 2 (two) times daily with a meal.    . fluticasone (FLONASE) 50 MCG/ACT nasal spray Place 1 spray into both nostrils at bedtime.    . furosemide (LASIX) 20 MG tablet  Take 20 mg by mouth.    Marland Kitchen guaiFENesin (MUCINEX) 600 MG 12 hr tablet Take 600 mg by mouth 2 (two) times daily.    Marland Kitchen HYDROcodone-acetaminophen (NORCO/VICODIN) 5-325 MG tablet Take 1 tablet by mouth every 6 (six) hours as needed for moderate pain.    . hydrOXYzine (ATARAX/VISTARIL) 50 MG tablet Take 50 mg by mouth at bedtime.     Marland Kitchen ipratropium-albuterol (DUONEB) 0.5-2.5 (3) MG/3ML SOLN Take 3 mLs by nebulization 3 (three) times daily as needed.    . isosorbide mononitrate (IMDUR) 30 MG 24 hr tablet Take 1.5 tablets (45 mg total) by mouth daily. 135 tablet 3  . latanoprost (XALATAN) 0.005 % ophthalmic solution Place 1 drop into both eyes at bedtime.    Marland Kitchen levothyroxine (SYNTHROID, LEVOTHROID) 200 MCG tablet Take 200 mcg by mouth daily before breakfast.    . metoprolol tartrate (LOPRESSOR) 25 MG tablet Take 1 tablet (25 mg total) by mouth 2 (two) times daily. 180 tablet 3  . Multiple Vitamins-Minerals (MULTIVITAMIN ADULT PO) Take 1 tablet by mouth daily.    . nitroGLYCERIN (NITROSTAT) 0.4 MG SL tablet Place 0.4 mg under the tongue  every 5 (five) minutes as needed for chest pain.    . nortriptyline (PAMELOR) 50 MG capsule Take 50 mg by mouth at bedtime.    Marland Kitchen omeprazole (PRILOSEC) 20 MG capsule Take 20 mg by mouth daily.    . ondansetron (ZOFRAN) 4 MG tablet Take 4 mg by mouth every 8 (eight) hours as needed for nausea or vomiting.    . phenytoin (DILANTIN) 100 MG ER capsule Take 200-300 mg by mouth 2 (two) times daily. 2 tabs am,3 tabs at hs    . pravastatin (PRAVACHOL) 80 MG tablet Take 80 mg by mouth daily.    . pregabalin (LYRICA) 75 MG capsule Take 75 mg by mouth daily.    Marland Kitchen Propylene Glycol (SYSTANE BALANCE) 0.6 % SOLN Place 1 drop into both eyes 2 (two) times daily. Both eyes    . senna (SENOKOT) 8.6 MG TABS tablet Take 1 tablet by mouth 2 (two) times daily.    . vitamin C (ASCORBIC ACID) 500 MG tablet Take 500 mg by mouth 2 (two) times daily.    Marland Kitchen warfarin (COUMADIN) 2.5 MG tablet Take 2.5 mg by  mouth daily. Takes with a 3 mg tablet to make 5.5 mg    . warfarin (COUMADIN) 3 MG tablet Take 3 mg by mouth daily. Takes with a 2.5 mg tablet to make 5.5 mg     No current facility-administered medications for this visit.     No past surgical history on file.   Allergies  Allergen Reactions  . Latex Other (See Comments)    unknown      Family History Patient denies any significant family history of heart disease   Social History Karen Mccann reports that she quit smoking about 12 years ago. Her smoking use included Cigarettes. She smoked 0.50 packs per day. She has never used smokeless tobacco. Karen Mccann reports that she does not drink alcohol.   Review of Systems CONSTITUTIONAL: No weight loss, fever, chills, weakness or fatigue.  HEENT: Eyes: No visual loss, blurred vision, double vision or yellow sclerae.No hearing loss, sneezing, congestion, runny nose or sore throat.  SKIN: No rash or itching.  CARDIOVASCULAR: per HPI RESPIRATORY: No shortness of breath, cough or sputum.  GASTROINTESTINAL: No anorexia, nausea, vomiting or diarrhea. No abdominal pain or blood.  GENITOURINARY: No burning on urination, no polyuria NEUROLOGICAL: No headache, dizziness, syncope, paralysis, ataxia, numbness or tingling in the extremities. No change in bowel or bladder control.  MUSCULOSKELETAL: No muscle, back pain, joint pain or stiffness.  LYMPHATICS: No enlarged nodes. No history of splenectomy.  PSYCHIATRIC: No history of depression or anxiety.  ENDOCRINOLOGIC: No reports of sweating, cold or heat intolerance. No polyuria or polydipsia.  Marland Kitchen   Physical Examination Filed Vitals:   01/17/16 1038  BP: 122/76  Pulse: 84   Filed Vitals:   01/17/16 1038  Height: 5\' 9"  (1.753 m)  Weight: 273 lb (123.832 kg)    Gen: resting comfortably, no acute distress HEENT: no scleral icterus, pupils equal round and reactive, no palptable cervical adenopathy,  CV: RRR, no m/r/g, no jvd Resp:  Clear to auscultation bilaterally GI: abdomen is soft, non-tender, non-distended, normal bowel sounds, no hepatosplenomegaly MSK: extremities are warm, no edema.  Skin: warm, no rash Neuro:  no focal deficits Psych: appropriate affect   Diagnostic Studies 05/2015 Lexiscan MPI  There was no ST segment deviation noted during stress.  Defect 1: There is a small defect of mild severity present in the basal inferolateral, mid inferolateral,  apical inferior and apical lateral location. As regional wall motion was grossly normal, this appeared to be most consistent with soft tissue attenuation artifact.  This is a low risk study.  Nuclear stress EF: 56%.    Assessment and Plan  1. CAD - chest pain has resolves since increasing her long acting nitrates - .Lexiscan low risk, probable soft tissue attenuation as opposed to ischemia - continue current meds     F/u 6 months      Arnoldo Lenis, M.D.

## 2016-01-17 NOTE — Patient Instructions (Signed)
Medication Instructions:  Please send [place] a current copy of your medication list.   Labwork: I will request a copy of your labs from your pcp  Testing/Procedures: none  Follow-Up: Your physician wants you to follow-up in: 6 months.  You will receive a reminder letter in the mail two months in advance. If you don't receive a letter, please call our office to schedule the follow-up appointment.   Any Other Special Instructions Will Be Listed Below (If Applicable).     If you need a refill on your cardiac medications before your next appointment, please call your pharmacy.

## 2016-01-18 ENCOUNTER — Ambulatory Visit (HOSPITAL_COMMUNITY)
Admission: RE | Admit: 2016-01-18 | Discharge: 2016-01-18 | Disposition: A | Discharge status: Home / Self Care | Payer: Medicare Other | Source: Ambulatory Visit | Attending: "Endocrinology | Admitting: "Endocrinology

## 2016-01-18 DIAGNOSIS — Z7401 Bed confinement status: Secondary | ICD-10-CM | POA: Diagnosis not present

## 2016-01-18 DIAGNOSIS — E042 Nontoxic multinodular goiter: Secondary | ICD-10-CM | POA: Diagnosis not present

## 2016-01-18 DIAGNOSIS — E041 Nontoxic single thyroid nodule: Secondary | ICD-10-CM | POA: Diagnosis not present

## 2016-01-18 DIAGNOSIS — R279 Unspecified lack of coordination: Secondary | ICD-10-CM | POA: Diagnosis not present

## 2016-01-18 MED ORDER — LIDOCAINE HCL (PF) 2 % IJ SOLN
INTRAMUSCULAR | Status: AC
  Filled 2016-01-18: qty 10

## 2016-01-18 NOTE — Procedures (Signed)
PreOperative Dx: LT thyroid bed nodule Postoperative Dx: LT thyroid bed nodule Procedure:   US guided FNA of LT thyroid bed nodule Radiologist:  Thornton Papas Anesthesia:  3 ml of 2% lidocaine Specimen:  FNA x 4  EBL:   < 1 ml Complications: None - tolerated very well by patient

## 2016-01-18 NOTE — Discharge Instructions (Signed)

## 2016-01-23 DIAGNOSIS — F332 Major depressive disorder, recurrent severe without psychotic features: Secondary | ICD-10-CM | POA: Diagnosis not present

## 2016-01-23 DIAGNOSIS — F411 Generalized anxiety disorder: Secondary | ICD-10-CM | POA: Diagnosis not present

## 2016-01-26 DIAGNOSIS — Z7901 Long term (current) use of anticoagulants: Secondary | ICD-10-CM | POA: Diagnosis not present

## 2016-01-26 DIAGNOSIS — I4891 Unspecified atrial fibrillation: Secondary | ICD-10-CM | POA: Diagnosis not present

## 2016-01-29 DIAGNOSIS — Z8673 Personal history of transient ischemic attack (TIA), and cerebral infarction without residual deficits: Secondary | ICD-10-CM | POA: Diagnosis not present

## 2016-01-29 DIAGNOSIS — G40909 Epilepsy, unspecified, not intractable, without status epilepticus: Secondary | ICD-10-CM | POA: Diagnosis not present

## 2016-01-30 DIAGNOSIS — R569 Unspecified convulsions: Secondary | ICD-10-CM | POA: Diagnosis not present

## 2016-01-30 DIAGNOSIS — F332 Major depressive disorder, recurrent severe without psychotic features: Secondary | ICD-10-CM | POA: Diagnosis not present

## 2016-01-30 DIAGNOSIS — F411 Generalized anxiety disorder: Secondary | ICD-10-CM | POA: Diagnosis not present

## 2016-02-02 DIAGNOSIS — I5030 Unspecified diastolic (congestive) heart failure: Secondary | ICD-10-CM | POA: Diagnosis not present

## 2016-02-02 DIAGNOSIS — I2510 Atherosclerotic heart disease of native coronary artery without angina pectoris: Secondary | ICD-10-CM | POA: Diagnosis not present

## 2016-02-02 DIAGNOSIS — I4891 Unspecified atrial fibrillation: Secondary | ICD-10-CM | POA: Diagnosis not present

## 2016-02-02 DIAGNOSIS — I509 Heart failure, unspecified: Secondary | ICD-10-CM | POA: Diagnosis not present

## 2016-02-02 DIAGNOSIS — D649 Anemia, unspecified: Secondary | ICD-10-CM | POA: Diagnosis not present

## 2016-02-02 DIAGNOSIS — E039 Hypothyroidism, unspecified: Secondary | ICD-10-CM | POA: Diagnosis not present

## 2016-02-05 DIAGNOSIS — Z7901 Long term (current) use of anticoagulants: Secondary | ICD-10-CM | POA: Diagnosis not present

## 2016-02-05 DIAGNOSIS — G40909 Epilepsy, unspecified, not intractable, without status epilepticus: Secondary | ICD-10-CM | POA: Diagnosis not present

## 2016-02-05 DIAGNOSIS — I4891 Unspecified atrial fibrillation: Secondary | ICD-10-CM | POA: Diagnosis not present

## 2016-02-05 DIAGNOSIS — Z8673 Personal history of transient ischemic attack (TIA), and cerebral infarction without residual deficits: Secondary | ICD-10-CM | POA: Diagnosis not present

## 2016-02-05 DIAGNOSIS — R569 Unspecified convulsions: Secondary | ICD-10-CM | POA: Diagnosis not present

## 2016-02-08 DIAGNOSIS — Z7901 Long term (current) use of anticoagulants: Secondary | ICD-10-CM | POA: Diagnosis not present

## 2016-02-08 DIAGNOSIS — R569 Unspecified convulsions: Secondary | ICD-10-CM | POA: Diagnosis not present

## 2016-02-08 DIAGNOSIS — I4891 Unspecified atrial fibrillation: Secondary | ICD-10-CM | POA: Diagnosis not present

## 2016-02-14 DIAGNOSIS — R569 Unspecified convulsions: Secondary | ICD-10-CM | POA: Diagnosis not present

## 2016-02-14 DIAGNOSIS — Z7901 Long term (current) use of anticoagulants: Secondary | ICD-10-CM | POA: Diagnosis not present

## 2016-02-15 DIAGNOSIS — Z7901 Long term (current) use of anticoagulants: Secondary | ICD-10-CM | POA: Diagnosis not present

## 2016-02-15 DIAGNOSIS — R569 Unspecified convulsions: Secondary | ICD-10-CM | POA: Diagnosis not present

## 2016-02-15 DIAGNOSIS — I4891 Unspecified atrial fibrillation: Secondary | ICD-10-CM | POA: Diagnosis not present

## 2016-02-19 DIAGNOSIS — I2510 Atherosclerotic heart disease of native coronary artery without angina pectoris: Secondary | ICD-10-CM | POA: Diagnosis not present

## 2016-02-19 DIAGNOSIS — N182 Chronic kidney disease, stage 2 (mild): Secondary | ICD-10-CM | POA: Diagnosis not present

## 2016-02-19 DIAGNOSIS — F3130 Bipolar disorder, current episode depressed, mild or moderate severity, unspecified: Secondary | ICD-10-CM | POA: Diagnosis not present

## 2016-02-19 DIAGNOSIS — F419 Anxiety disorder, unspecified: Secondary | ICD-10-CM | POA: Diagnosis not present

## 2016-02-19 DIAGNOSIS — I4891 Unspecified atrial fibrillation: Secondary | ICD-10-CM | POA: Diagnosis not present

## 2016-02-19 DIAGNOSIS — Z7901 Long term (current) use of anticoagulants: Secondary | ICD-10-CM | POA: Diagnosis not present

## 2016-02-20 DIAGNOSIS — F332 Major depressive disorder, recurrent severe without psychotic features: Secondary | ICD-10-CM | POA: Diagnosis not present

## 2016-02-20 DIAGNOSIS — F411 Generalized anxiety disorder: Secondary | ICD-10-CM | POA: Diagnosis not present

## 2016-02-22 DIAGNOSIS — Z7901 Long term (current) use of anticoagulants: Secondary | ICD-10-CM | POA: Diagnosis not present

## 2016-02-22 DIAGNOSIS — I4891 Unspecified atrial fibrillation: Secondary | ICD-10-CM | POA: Diagnosis not present

## 2016-02-29 DIAGNOSIS — I4891 Unspecified atrial fibrillation: Secondary | ICD-10-CM | POA: Diagnosis not present

## 2016-02-29 DIAGNOSIS — Z7901 Long term (current) use of anticoagulants: Secondary | ICD-10-CM | POA: Diagnosis not present

## 2016-03-07 DIAGNOSIS — I4891 Unspecified atrial fibrillation: Secondary | ICD-10-CM | POA: Diagnosis not present

## 2016-03-07 DIAGNOSIS — Z7901 Long term (current) use of anticoagulants: Secondary | ICD-10-CM | POA: Diagnosis not present

## 2016-03-11 DIAGNOSIS — Z7901 Long term (current) use of anticoagulants: Secondary | ICD-10-CM | POA: Diagnosis not present

## 2016-03-11 DIAGNOSIS — I4891 Unspecified atrial fibrillation: Secondary | ICD-10-CM | POA: Diagnosis not present

## 2016-03-11 DIAGNOSIS — I2510 Atherosclerotic heart disease of native coronary artery without angina pectoris: Secondary | ICD-10-CM | POA: Diagnosis not present

## 2016-03-11 DIAGNOSIS — Z8673 Personal history of transient ischemic attack (TIA), and cerebral infarction without residual deficits: Secondary | ICD-10-CM | POA: Diagnosis not present

## 2016-03-11 DIAGNOSIS — F419 Anxiety disorder, unspecified: Secondary | ICD-10-CM | POA: Diagnosis not present

## 2016-03-11 DIAGNOSIS — F3130 Bipolar disorder, current episode depressed, mild or moderate severity, unspecified: Secondary | ICD-10-CM | POA: Diagnosis not present

## 2016-03-12 DIAGNOSIS — F332 Major depressive disorder, recurrent severe without psychotic features: Secondary | ICD-10-CM | POA: Diagnosis not present

## 2016-03-12 DIAGNOSIS — F411 Generalized anxiety disorder: Secondary | ICD-10-CM | POA: Diagnosis not present

## 2016-03-15 DIAGNOSIS — I4891 Unspecified atrial fibrillation: Secondary | ICD-10-CM | POA: Diagnosis not present

## 2016-03-15 DIAGNOSIS — Z7901 Long term (current) use of anticoagulants: Secondary | ICD-10-CM | POA: Diagnosis not present

## 2016-03-18 DIAGNOSIS — I4891 Unspecified atrial fibrillation: Secondary | ICD-10-CM | POA: Diagnosis not present

## 2016-03-18 DIAGNOSIS — Z8673 Personal history of transient ischemic attack (TIA), and cerebral infarction without residual deficits: Secondary | ICD-10-CM | POA: Diagnosis not present

## 2016-03-18 DIAGNOSIS — G40909 Epilepsy, unspecified, not intractable, without status epilepticus: Secondary | ICD-10-CM | POA: Diagnosis not present

## 2016-03-18 DIAGNOSIS — R569 Unspecified convulsions: Secondary | ICD-10-CM | POA: Diagnosis not present

## 2016-03-23 ENCOUNTER — Emergency Department (HOSPITAL_COMMUNITY): Payer: Medicare Other

## 2016-03-23 ENCOUNTER — Inpatient Hospital Stay (HOSPITAL_COMMUNITY)
Admission: EM | Admit: 2016-03-23 | Discharge: 2016-03-27 | DRG: 872 | Disposition: A | Discharge status: Skilled Nursing Facility | Payer: Medicare Other | Attending: Internal Medicine | Admitting: Internal Medicine

## 2016-03-23 ENCOUNTER — Encounter (HOSPITAL_COMMUNITY): Payer: Self-pay | Admitting: *Deleted

## 2016-03-23 DIAGNOSIS — Z7901 Long term (current) use of anticoagulants: Secondary | ICD-10-CM | POA: Diagnosis not present

## 2016-03-23 DIAGNOSIS — G8220 Paraplegia, unspecified: Secondary | ICD-10-CM | POA: Diagnosis not present

## 2016-03-23 DIAGNOSIS — R41841 Cognitive communication deficit: Secondary | ICD-10-CM | POA: Diagnosis not present

## 2016-03-23 DIAGNOSIS — G8929 Other chronic pain: Secondary | ICD-10-CM | POA: Diagnosis not present

## 2016-03-23 DIAGNOSIS — Z1623 Resistance to quinolones and fluoroquinolones: Secondary | ICD-10-CM | POA: Diagnosis present

## 2016-03-23 DIAGNOSIS — Z951 Presence of aortocoronary bypass graft: Secondary | ICD-10-CM | POA: Diagnosis not present

## 2016-03-23 DIAGNOSIS — Z8679 Personal history of other diseases of the circulatory system: Secondary | ICD-10-CM | POA: Diagnosis not present

## 2016-03-23 DIAGNOSIS — I10 Essential (primary) hypertension: Secondary | ICD-10-CM | POA: Diagnosis not present

## 2016-03-23 DIAGNOSIS — R1084 Generalized abdominal pain: Secondary | ICD-10-CM | POA: Diagnosis not present

## 2016-03-23 DIAGNOSIS — R278 Other lack of coordination: Secondary | ICD-10-CM | POA: Diagnosis not present

## 2016-03-23 DIAGNOSIS — Z79891 Long term (current) use of opiate analgesic: Secondary | ICD-10-CM

## 2016-03-23 DIAGNOSIS — Z1611 Resistance to penicillins: Secondary | ICD-10-CM | POA: Diagnosis present

## 2016-03-23 DIAGNOSIS — F800 Phonological disorder: Secondary | ICD-10-CM | POA: Diagnosis not present

## 2016-03-23 DIAGNOSIS — E785 Hyperlipidemia, unspecified: Secondary | ICD-10-CM | POA: Diagnosis present

## 2016-03-23 DIAGNOSIS — F319 Bipolar disorder, unspecified: Secondary | ICD-10-CM | POA: Diagnosis present

## 2016-03-23 DIAGNOSIS — E039 Hypothyroidism, unspecified: Secondary | ICD-10-CM | POA: Diagnosis present

## 2016-03-23 DIAGNOSIS — E8809 Other disorders of plasma-protein metabolism, not elsewhere classified: Secondary | ICD-10-CM | POA: Diagnosis not present

## 2016-03-23 DIAGNOSIS — N390 Urinary tract infection, site not specified: Secondary | ICD-10-CM | POA: Diagnosis present

## 2016-03-23 DIAGNOSIS — K5900 Constipation, unspecified: Secondary | ICD-10-CM | POA: Diagnosis not present

## 2016-03-23 DIAGNOSIS — Z8249 Family history of ischemic heart disease and other diseases of the circulatory system: Secondary | ICD-10-CM

## 2016-03-23 DIAGNOSIS — Z7982 Long term (current) use of aspirin: Secondary | ICD-10-CM | POA: Diagnosis not present

## 2016-03-23 DIAGNOSIS — A4151 Sepsis due to Escherichia coli [E. coli]: Principal | ICD-10-CM | POA: Diagnosis present

## 2016-03-23 DIAGNOSIS — I69354 Hemiplegia and hemiparesis following cerebral infarction affecting left non-dominant side: Secondary | ICD-10-CM | POA: Diagnosis not present

## 2016-03-23 DIAGNOSIS — T7840XA Allergy, unspecified, initial encounter: Secondary | ICD-10-CM | POA: Diagnosis not present

## 2016-03-23 DIAGNOSIS — Z1612 Extended spectrum beta lactamase (ESBL) resistance: Secondary | ICD-10-CM | POA: Diagnosis present

## 2016-03-23 DIAGNOSIS — K219 Gastro-esophageal reflux disease without esophagitis: Secondary | ICD-10-CM | POA: Diagnosis present

## 2016-03-23 DIAGNOSIS — Z1624 Resistance to multiple antibiotics: Secondary | ICD-10-CM | POA: Diagnosis present

## 2016-03-23 DIAGNOSIS — M6281 Muscle weakness (generalized): Secondary | ICD-10-CM | POA: Diagnosis not present

## 2016-03-23 DIAGNOSIS — I110 Hypertensive heart disease with heart failure: Secondary | ICD-10-CM | POA: Diagnosis present

## 2016-03-23 DIAGNOSIS — F418 Other specified anxiety disorders: Secondary | ICD-10-CM | POA: Diagnosis not present

## 2016-03-23 DIAGNOSIS — I509 Heart failure, unspecified: Secondary | ICD-10-CM | POA: Diagnosis present

## 2016-03-23 DIAGNOSIS — Z87891 Personal history of nicotine dependence: Secondary | ICD-10-CM | POA: Diagnosis not present

## 2016-03-23 DIAGNOSIS — R293 Abnormal posture: Secondary | ICD-10-CM | POA: Diagnosis not present

## 2016-03-23 DIAGNOSIS — R2681 Unsteadiness on feet: Secondary | ICD-10-CM | POA: Diagnosis not present

## 2016-03-23 DIAGNOSIS — Z7951 Long term (current) use of inhaled steroids: Secondary | ICD-10-CM | POA: Diagnosis not present

## 2016-03-23 DIAGNOSIS — I69954 Hemiplegia and hemiparesis following unspecified cerebrovascular disease affecting left non-dominant side: Secondary | ICD-10-CM | POA: Diagnosis not present

## 2016-03-23 DIAGNOSIS — E559 Vitamin D deficiency, unspecified: Secondary | ICD-10-CM | POA: Diagnosis not present

## 2016-03-23 DIAGNOSIS — A419 Sepsis, unspecified organism: Secondary | ICD-10-CM | POA: Diagnosis not present

## 2016-03-23 DIAGNOSIS — I2510 Atherosclerotic heart disease of native coronary artery without angina pectoris: Secondary | ICD-10-CM | POA: Diagnosis present

## 2016-03-23 DIAGNOSIS — J449 Chronic obstructive pulmonary disease, unspecified: Secondary | ICD-10-CM | POA: Diagnosis present

## 2016-03-23 DIAGNOSIS — Z7401 Bed confinement status: Secondary | ICD-10-CM | POA: Diagnosis not present

## 2016-03-23 DIAGNOSIS — R279 Unspecified lack of coordination: Secondary | ICD-10-CM | POA: Diagnosis not present

## 2016-03-23 DIAGNOSIS — R509 Fever, unspecified: Secondary | ICD-10-CM | POA: Diagnosis not present

## 2016-03-23 DIAGNOSIS — F411 Generalized anxiety disorder: Secondary | ICD-10-CM | POA: Diagnosis not present

## 2016-03-23 DIAGNOSIS — F419 Anxiety disorder, unspecified: Secondary | ICD-10-CM | POA: Diagnosis not present

## 2016-03-23 DIAGNOSIS — F339 Major depressive disorder, recurrent, unspecified: Secondary | ICD-10-CM | POA: Diagnosis not present

## 2016-03-23 DIAGNOSIS — B009 Herpesviral infection, unspecified: Secondary | ICD-10-CM | POA: Diagnosis not present

## 2016-03-23 LAB — PROCALCITONIN: Procalcitonin: 0.41 ng/mL

## 2016-03-23 LAB — COMPREHENSIVE METABOLIC PANEL
ALBUMIN: 3.4 g/dL — AB (ref 3.5–5.0)
ALK PHOS: 158 U/L — AB (ref 38–126)
ALT: 27 U/L (ref 14–54)
ANION GAP: 7 (ref 5–15)
AST: 23 U/L (ref 15–41)
BILIRUBIN TOTAL: 0.6 mg/dL (ref 0.3–1.2)
BUN: 9 mg/dL (ref 6–20)
CALCIUM: 8.7 mg/dL — AB (ref 8.9–10.3)
CO2: 30 mmol/L (ref 22–32)
Chloride: 101 mmol/L (ref 101–111)
Creatinine, Ser: 0.68 mg/dL (ref 0.44–1.00)
GFR calc Af Amer: >60 mL/min (ref >60)
GFR calc non Af Amer: >60 mL/min (ref >60)
GLUCOSE: 119 mg/dL — AB (ref 65–99)
Potassium: 4.1 mmol/L (ref 3.5–5.1)
SODIUM: 138 mmol/L (ref 135–145)
TOTAL PROTEIN: 7.2 g/dL (ref 6.5–8.1)

## 2016-03-23 LAB — TROPONIN I: Troponin I: 0.03 ng/mL (ref <0.031)

## 2016-03-23 LAB — URINE MICROSCOPIC-ADD ON

## 2016-03-23 LAB — URINALYSIS, ROUTINE W REFLEX MICROSCOPIC
BILIRUBIN URINE: NEGATIVE
Glucose, UA: NEGATIVE mg/dL
KETONES UR: NEGATIVE mg/dL
NITRITE: POSITIVE — AB
PROTEIN: 30 mg/dL — AB
Specific Gravity, Urine: 1.02 (ref 1.005–1.030)
pH: 5.5 (ref 5.0–8.0)

## 2016-03-23 LAB — CBC WITH DIFFERENTIAL/PLATELET
BASOS PCT: 0 %
Basophils Absolute: 0 10*3/uL (ref 0.0–0.1)
EOS PCT: 0 %
Eosinophils Absolute: 0 10*3/uL (ref 0.0–0.7)
HEMATOCRIT: 46.5 % — AB (ref 36.0–46.0)
Hemoglobin: 15.3 g/dL — ABNORMAL HIGH (ref 12.0–15.0)
Lymphocytes Relative: 7 %
Lymphs Abs: 1.2 10*3/uL (ref 0.7–4.0)
MCH: 31.7 pg (ref 26.0–34.0)
MCHC: 32.9 g/dL (ref 30.0–36.0)
MCV: 96.3 fL (ref 78.0–100.0)
MONO ABS: 2 10*3/uL — AB (ref 0.1–1.0)
MONOS PCT: 12 %
NEUTROS ABS: 14.2 10*3/uL — AB (ref 1.7–7.7)
Neutrophils Relative %: 81 %
PLATELETS: 178 10*3/uL (ref 150–400)
RBC: 4.83 MIL/uL (ref 3.87–5.11)
RDW: 15.2 % (ref 11.5–15.5)
WBC: 17.5 10*3/uL — ABNORMAL HIGH (ref 4.0–10.5)

## 2016-03-23 LAB — PHENYTOIN LEVEL, TOTAL: Phenytoin Lvl: 19.1 ug/mL (ref 10.0–20.0)

## 2016-03-23 LAB — LACTIC ACID, PLASMA: Lactic Acid, Venous: 2.2 mmol/L (ref 0.5–2.0)

## 2016-03-23 LAB — APTT: aPTT: 48 seconds — ABNORMAL HIGH (ref 24–37)

## 2016-03-23 LAB — I-STAT CG4 LACTIC ACID, ED: Lactic Acid, Venous: 3 mmol/L (ref 0.5–2.0)

## 2016-03-23 LAB — PROTIME-INR
INR: 2.77 — AB (ref 0.00–1.49)
PROTHROMBIN TIME: 28.8 s — AB (ref 11.6–15.2)

## 2016-03-23 MED ORDER — PIPERACILLIN-TAZOBACTAM 3.375 G IVPB 30 MIN
3.3750 g | Freq: Once | INTRAVENOUS | Status: AC
  Administered 2016-03-23: 3.375 g via INTRAVENOUS
  Filled 2016-03-23: qty 50

## 2016-03-23 MED ORDER — ONDANSETRON HCL 4 MG PO TABS
4.0000 mg | ORAL_TABLET | Freq: Four times a day (QID) | ORAL | Status: DC | PRN
  Administered 2016-03-26 – 2016-03-27 (×2): 4 mg via ORAL
  Filled 2016-03-23 (×2): qty 1

## 2016-03-23 MED ORDER — TIZANIDINE HCL 4 MG PO TABS
2.0000 mg | ORAL_TABLET | Freq: Four times a day (QID) | ORAL | Status: DC | PRN
Start: 2016-03-23 — End: 2016-03-27

## 2016-03-23 MED ORDER — PIPERACILLIN-TAZOBACTAM 3.375 G IVPB
3.3750 g | Freq: Three times a day (TID) | INTRAVENOUS | Status: DC
  Administered 2016-03-23 – 2016-03-25 (×5): 3.375 g via INTRAVENOUS
  Filled 2016-03-23 (×5): qty 50

## 2016-03-23 MED ORDER — PRAVASTATIN SODIUM 40 MG PO TABS
80.0000 mg | ORAL_TABLET | Freq: Every day | ORAL | Status: DC
  Administered 2016-03-24 – 2016-03-27 (×4): 80 mg via ORAL
  Filled 2016-03-23 (×4): qty 2

## 2016-03-23 MED ORDER — FLUTICASONE PROPIONATE 50 MCG/ACT NA SUSP
1.0000 | Freq: Every day | NASAL | Status: DC
  Administered 2016-03-23 – 2016-03-26 (×4): 1 via NASAL
  Filled 2016-03-23: qty 16

## 2016-03-23 MED ORDER — SODIUM CHLORIDE 0.9 % IV BOLUS (SEPSIS)
1000.0000 mL | Freq: Once | INTRAVENOUS | Status: AC
  Administered 2016-03-23: 1000 mL via INTRAVENOUS

## 2016-03-23 MED ORDER — ACETAMINOPHEN 325 MG PO TABS
650.0000 mg | ORAL_TABLET | Freq: Four times a day (QID) | ORAL | Status: DC | PRN
  Administered 2016-03-26: 650 mg via ORAL
  Filled 2016-03-23: qty 2

## 2016-03-23 MED ORDER — ISOSORBIDE MONONITRATE ER 30 MG PO TB24
45.0000 mg | ORAL_TABLET | Freq: Every day | ORAL | Status: DC
  Administered 2016-03-24 – 2016-03-27 (×4): 45 mg via ORAL
  Filled 2016-03-23 (×4): qty 2

## 2016-03-23 MED ORDER — DIATRIZOATE MEGLUMINE & SODIUM 66-10 % PO SOLN
ORAL | Status: AC
  Filled 2016-03-23: qty 30

## 2016-03-23 MED ORDER — PREGABALIN 75 MG PO CAPS
75.0000 mg | ORAL_CAPSULE | Freq: Every day | ORAL | Status: DC
  Administered 2016-03-23 – 2016-03-27 (×5): 75 mg via ORAL
  Filled 2016-03-23 (×5): qty 1

## 2016-03-23 MED ORDER — ACETAMINOPHEN 650 MG RE SUPP: 650.0000 mg | Freq: Four times a day (QID) | RECTAL | Status: DC | PRN

## 2016-03-23 MED ORDER — VANCOMYCIN HCL IN DEXTROSE 1-5 GM/200ML-% IV SOLN: 1000.0000 mg | Freq: Once | INTRAVENOUS | Status: DC

## 2016-03-23 MED ORDER — LORATADINE 10 MG PO TABS
10.0000 mg | ORAL_TABLET | Freq: Every day | ORAL | Status: DC
  Administered 2016-03-23 – 2016-03-27 (×6): 10 mg via ORAL
  Filled 2016-03-23 (×6): qty 1

## 2016-03-23 MED ORDER — NITROGLYCERIN 0.4 MG SL SUBL: 0.4000 mg | SUBLINGUAL_TABLET | SUBLINGUAL | Status: DC | PRN

## 2016-03-23 MED ORDER — METOPROLOL TARTRATE 25 MG PO TABS
25.0000 mg | ORAL_TABLET | Freq: Two times a day (BID) | ORAL | Status: DC
  Administered 2016-03-23 – 2016-03-27 (×7): 25 mg via ORAL
  Filled 2016-03-23 (×8): qty 1

## 2016-03-23 MED ORDER — CALCIUM CARBONATE-VITAMIN D 500-200 MG-UNIT PO TABS
1.0000 | ORAL_TABLET | Freq: Every day | ORAL | Status: DC
  Administered 2016-03-24 – 2016-03-27 (×4): 1 via ORAL
  Filled 2016-03-23 (×4): qty 1

## 2016-03-23 MED ORDER — HYDROXYZINE HCL 25 MG PO TABS
50.0000 mg | ORAL_TABLET | Freq: Every day | ORAL | Status: DC
  Administered 2016-03-23 – 2016-03-26 (×4): 50 mg via ORAL
  Filled 2016-03-23 (×4): qty 2

## 2016-03-23 MED ORDER — NORTRIPTYLINE HCL 25 MG PO CAPS
50.0000 mg | ORAL_CAPSULE | Freq: Every day | ORAL | Status: DC
  Administered 2016-03-23 – 2016-03-26 (×4): 50 mg via ORAL
  Filled 2016-03-23 (×4): qty 2

## 2016-03-23 MED ORDER — LORAZEPAM 1 MG PO TABS
1.0000 mg | ORAL_TABLET | Freq: Four times a day (QID) | ORAL | Status: DC | PRN
  Administered 2016-03-23 – 2016-03-27 (×2): 1 mg via ORAL
  Filled 2016-03-23 (×2): qty 1

## 2016-03-23 MED ORDER — ASPIRIN EC 81 MG PO TBEC
81.0000 mg | DELAYED_RELEASE_TABLET | Freq: Every day | ORAL | Status: DC
  Administered 2016-03-24 – 2016-03-27 (×4): 81 mg via ORAL
  Filled 2016-03-23 (×4): qty 1

## 2016-03-23 MED ORDER — PHENYTOIN SODIUM EXTENDED 100 MG PO CAPS
200.0000 mg | ORAL_CAPSULE | Freq: Three times a day (TID) | ORAL | Status: DC
  Administered 2016-03-23 – 2016-03-27 (×9): 200 mg via ORAL
  Filled 2016-03-23 (×11): qty 2

## 2016-03-23 MED ORDER — ONDANSETRON HCL 4 MG/2ML IJ SOLN
4.0000 mg | Freq: Four times a day (QID) | INTRAMUSCULAR | Status: DC | PRN
  Administered 2016-03-25 – 2016-03-27 (×2): 4 mg via INTRAVENOUS
  Filled 2016-03-23 (×2): qty 2

## 2016-03-23 MED ORDER — PANTOPRAZOLE SODIUM 40 MG PO TBEC
40.0000 mg | DELAYED_RELEASE_TABLET | Freq: Every day | ORAL | Status: DC
  Administered 2016-03-23 – 2016-03-27 (×5): 40 mg via ORAL
  Filled 2016-03-23 (×5): qty 1

## 2016-03-23 MED ORDER — ADULT MULTIVITAMIN W/MINERALS CH
1.0000 | ORAL_TABLET | Freq: Every day | ORAL | Status: DC
  Administered 2016-03-24 – 2016-03-27 (×4): 1 via ORAL
  Filled 2016-03-23 (×4): qty 1

## 2016-03-23 MED ORDER — GUAIFENESIN ER 600 MG PO TB12
600.0000 mg | ORAL_TABLET | Freq: Two times a day (BID) | ORAL | Status: DC
  Administered 2016-03-23 – 2016-03-27 (×8): 600 mg via ORAL
  Filled 2016-03-23 (×8): qty 1

## 2016-03-23 MED ORDER — IPRATROPIUM-ALBUTEROL 0.5-2.5 (3) MG/3ML IN SOLN: 3.0000 mL | Freq: Three times a day (TID) | RESPIRATORY_TRACT | Status: DC | PRN

## 2016-03-23 MED ORDER — VITAMIN D 1000 UNITS PO TABS
1000.0000 [IU] | ORAL_TABLET | Freq: Every day | ORAL | Status: DC
  Administered 2016-03-24 – 2016-03-27 (×5): 1000 [IU] via ORAL
  Filled 2016-03-23 (×4): qty 1

## 2016-03-23 MED ORDER — SENNA 8.6 MG PO TABS
1.0000 | ORAL_TABLET | Freq: Two times a day (BID) | ORAL | Status: DC
  Administered 2016-03-23 – 2016-03-27 (×8): 8.6 mg via ORAL
  Filled 2016-03-23 (×8): qty 1

## 2016-03-23 MED ORDER — VANCOMYCIN HCL 10 G IV SOLR
2000.0000 mg | Freq: Once | INTRAVENOUS | Status: AC
  Administered 2016-03-23: 2000 mg via INTRAVENOUS
  Filled 2016-03-23: qty 2000

## 2016-03-23 MED ORDER — FERROUS SULFATE 325 (65 FE) MG PO TABS
325.0000 mg | ORAL_TABLET | Freq: Two times a day (BID) | ORAL | Status: DC
  Administered 2016-03-24 – 2016-03-27 (×8): 325 mg via ORAL
  Filled 2016-03-23 (×8): qty 1

## 2016-03-23 MED ORDER — ONDANSETRON HCL 4 MG PO TABS: 4.0000 mg | ORAL_TABLET | Freq: Three times a day (TID) | ORAL | Status: DC | PRN

## 2016-03-23 MED ORDER — DULOXETINE HCL 60 MG PO CPEP
60.0000 mg | ORAL_CAPSULE | Freq: Every day | ORAL | Status: DC
  Administered 2016-03-23 – 2016-03-26 (×4): 60 mg via ORAL
  Filled 2016-03-23 (×4): qty 1

## 2016-03-23 MED ORDER — LATANOPROST 0.005 % OP SOLN
OPHTHALMIC | Status: AC
  Filled 2016-03-23: qty 2.5

## 2016-03-23 MED ORDER — OXYCODONE-ACETAMINOPHEN 5-325 MG PO TABS
1.0000 | ORAL_TABLET | Freq: Four times a day (QID) | ORAL | Status: DC | PRN
  Administered 2016-03-24 – 2016-03-27 (×3): 1 via ORAL
  Filled 2016-03-23 (×3): qty 1

## 2016-03-23 MED ORDER — SODIUM CHLORIDE 0.9 % IV SOLN: INTRAVENOUS | Status: AC

## 2016-03-23 MED ORDER — VANCOMYCIN HCL 10 G IV SOLR
1250.0000 mg | Freq: Two times a day (BID) | INTRAVENOUS | Status: DC
  Filled 2016-03-23 (×3): qty 1250

## 2016-03-23 MED ORDER — LEVOTHYROXINE SODIUM 100 MCG PO TABS
200.0000 ug | ORAL_TABLET | Freq: Every day | ORAL | Status: DC
  Administered 2016-03-24 – 2016-03-27 (×4): 200 ug via ORAL
  Filled 2016-03-23 (×4): qty 2

## 2016-03-23 MED ORDER — VITAMIN C 500 MG PO TABS
500.0000 mg | ORAL_TABLET | Freq: Two times a day (BID) | ORAL | Status: DC
  Administered 2016-03-23 – 2016-03-27 (×8): 500 mg via ORAL
  Filled 2016-03-23 (×8): qty 1

## 2016-03-23 MED ORDER — LATANOPROST 0.005 % OP SOLN
1.0000 [drp] | Freq: Every day | OPHTHALMIC | Status: DC
  Administered 2016-03-23 – 2016-03-26 (×4): 1 [drp] via OPHTHALMIC
  Filled 2016-03-23: qty 2.5

## 2016-03-23 NOTE — ED Notes (Signed)
Lab at bedside

## 2016-03-23 NOTE — ED Provider Notes (Signed)
CSN: [id number]     Arrival date & time 03/23/16  1404 History   First MD Initiated Contact with Patient 03/23/16 1408     Chief Complaint  Patient presents with  . Abdominal Pain     (Consider location/radiation/quality/duration/timing/severity/associated sxs/prior Treatment) Patient is a 65 y.o. female presenting with abdominal pain.  Abdominal Pain  65 year old female history of stroke with residual left-sided weakness presents today from nursing home complaining of lower abdominal pain that began 1-2 days ago. She describes it in the right flank and radiating across the abdomen to the left. It is sharp and pressure-like in nature. It's been constant since it started. She has noticed increased frequency of urination and dysuria. She has had nausea with 2 episodes of vomiting yesterday. She has had difficulty keeping down fluids today but states that she has continued to void frequently. She has not noted any diarrhea. She has had subjective fever and chills. Past Medical History  Diagnosis Date  . COPD (chronic obstructive pulmonary disease) ([place])   . Heart failure ([place])   . Hemiplegia ([place])   . Hemiparesis ([place])   . Thyroid disease   . Depression   . Bipolar 1 disorder, depressed ([place])   . Gastroesophageal reflux   . Stroke [facility])    History reviewed. No pertinent past surgical history. No family history on file. Social History  Substance Use Topics  . Smoking status: Former Smoker -- 0.50 packs/day    Types: Cigarettes    Quit date: 05/16/2003  . Smokeless tobacco: Never Used  . Alcohol Use: No   OB History    No data available     Review of Systems  Gastrointestinal: Positive for abdominal pain.  All other systems reviewed and are negative.     Allergies  Latex  Home Medications   Prior to Admission medications   Medication Sig Start Date End Date Taking? Authorizing Provider  acetaminophen (TYLENOL) 325 MG tablet Take 650 mg by mouth every 4 (four) hours as  needed. Reported on 01/17/2016    Historical Provider, MD  aspirin EC 81 MG tablet Take 81 mg by mouth daily.    Historical Provider, MD  calcium-vitamin D (OSCAL WITH D) 500-200 MG-UNIT per tablet Take 1 tablet by mouth daily with breakfast.    Historical Provider, MD  cetirizine (ZYRTEC) 10 MG tablet Take 10 mg by mouth daily.    Historical Provider, MD  Cholecalciferol (VITAMIN D3) 3000 UNITS TABS Take 1,000 Units by mouth daily.    Historical Provider, MD  DULoxetine (CYMBALTA) 60 MG capsule Take 60 mg by mouth at bedtime.     Historical Provider, MD  fluticasone (FLONASE) 50 MCG/ACT nasal spray Place 1 spray into both nostrils at bedtime.    Historical Provider, MD  furosemide (LASIX) 20 MG tablet Take 20 mg by mouth.    Historical Provider, MD  hydrOXYzine (ATARAX/VISTARIL) 50 MG tablet Take 50 mg by mouth at bedtime.     Historical Provider, MD  ipratropium-albuterol (DUONEB) 0.5-2.5 (3) MG/3ML SOLN Take 3 mLs by nebulization 3 (three) times daily as needed.    Historical Provider, MD  latanoprost (XALATAN) 0.005 % ophthalmic solution Place 1 drop into both eyes at bedtime. Reported on 01/17/2016    Historical Provider, MD  metoprolol tartrate (LOPRESSOR) 25 MG tablet Take 1 tablet (25 mg total) by mouth 2 (two) times daily. 05/16/15   Arnoldo Lenis, MD  nortriptyline (PAMELOR) 50 MG capsule Take 50 mg by mouth at bedtime.  Historical Provider, MD  omeprazole (PRILOSEC) 20 MG capsule Take 20 mg by mouth daily.    Historical Provider, MD  pravastatin (PRAVACHOL) 80 MG tablet Take 80 mg by mouth daily.    Historical Provider, MD  Propylene Glycol (SYSTANE BALANCE) 0.6 % SOLN Place 1 drop into both eyes 2 (two) times daily. Both eyes    Historical Provider, MD  senna (SENOKOT) 8.6 MG TABS tablet Take 1 tablet by mouth 2 (two) times daily.    Historical Provider, MD  vitamin C (ASCORBIC ACID) 500 MG tablet Take 500 mg by mouth 2 (two) times daily.    Historical Provider, MD  warfarin (COUMADIN)  2.5 MG tablet Take 2.5 mg by mouth daily. Reported on 01/17/2016    Historical Provider, MD  warfarin (COUMADIN) 3 MG tablet Take 3 mg by mouth daily. Reported on 01/17/2016    Historical Provider, MD   Pulse 105  Temp(Src) 102.5 F (39.2 C) (Oral)  Resp 20  SpO2 98% Physical Exam  Constitutional: She is oriented to person, place, and time. She appears well-developed and well-nourished. No distress.  Morbidly obese chronically ill appearing female  HENT:  Head: Normocephalic and atraumatic.  Right Ear: External ear normal.  Left Ear: External ear normal.  Nose: Nose normal.  Eyes: Conjunctivae are normal. Pupils are equal, round, and reactive to light.  Neck: Normal range of motion. Neck supple.  Cardiovascular: Normal rate and regular rhythm.   Pulmonary/Chest: Effort normal and breath sounds normal.  Abdominal: Soft. Bowel sounds are normal. She exhibits no distension and no mass. There is tenderness. There is no rebound and no guarding.  Mild diffuse tenderness to palpation  Musculoskeletal: Normal range of motion. She exhibits no edema or tenderness.  Neurological: She is alert and oriented to person, place, and time. No cranial nerve deficit.  Skin: Skin is warm and dry. No rash noted.  Psychiatric: She has a normal mood and affect.  Nursing note and vitals reviewed.   ED Course  Procedures (including critical care time) Labs Review Labs Reviewed  COMPREHENSIVE METABOLIC PANEL - Abnormal; Notable for the following:    Glucose, Bld 119 (*)    Calcium 8.7 (*)    Albumin 3.4 (*)    Alkaline Phosphatase 158 (*)    All other components within normal limits  CBC WITH DIFFERENTIAL/PLATELET - Abnormal; Notable for the following:    WBC 17.5 (*)    Hemoglobin 15.3 (*)    HCT 46.5 (*)    Neutro Abs 14.2 (*)    Monocytes Absolute 2.0 (*)    All other components within normal limits  URINALYSIS, ROUTINE W REFLEX MICROSCOPIC (NOT AT [facility]) - Abnormal; Notable for the following:     APPearance CLOUDY (*)    Hgb urine dipstick LARGE (*)    Protein, ur 30 (*)    Nitrite POSITIVE (*)    Leukocytes, UA LARGE (*)    All other components within normal limits  PROTIME-INR - Abnormal; Notable for the following:    Prothrombin Time 28.8 (*)    INR 2.77 (*)    All other components within normal limits  URINE MICROSCOPIC-ADD ON - Abnormal; Notable for the following:    Squamous Epithelial / LPF 6-30 (*)    Bacteria, UA MANY (*)    All other components within normal limits  I-STAT CG4 LACTIC ACID, ED - Abnormal; Notable for the following:    Lactic Acid, Venous 3.00 (*)    All other components within  normal limits  CULTURE, BLOOD (ROUTINE X 2)  CULTURE, BLOOD (ROUTINE X 2)  URINE CULTURE    Imaging Review Dg Chest 1 View  03/23/2016  CLINICAL DATA:  Fever. EXAM: CHEST 1 VIEW COMPARISON:  September 19, 2015 x-Aksel Bencomo FINDINGS: Stable cardiomegaly. No pneumothorax. No pulmonary nodules, masses, or infiltrates. No overt edema. IMPRESSION: No active disease. Electronically Signed   By: Dorise Bullion III M.D   On: 03/23/2016 14:59   I have personally reviewed and evaluated these images and lab results as part of my medical decision-making.   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 Patient has been given IV fluids and IV antibiotics here in the ED with blood pressure increasing from systolic [id number] to [id number]. Initial lactic acid elevated at 3.0. Patient's care discussed with Dr. Hal Hope on-call for hospitalist. He asked that patient  have CT abdomen. This is being ordered and temporary admission orders to be placed.   Pattricia Boss, MD 03/23/16 2159

## 2016-03-23 NOTE — H&P (Signed)
History and Physical    Karen Mccann [id number] DOB: 1951/10/11 DOA: 03/23/2016  PCP: [name], MD  Patient coming from: Nursing home.  Chief Complaint: Abdominal pain fever.  HPI: Karen Mccann is a 65 y.o. female with medical history significant of stroke with left-sided hemiplegia, CAD status post CABG, hypothyroidism, hyperlipidemia, seizures presents to the ER because of increasing abdominal pain and nausea vomiting over the last 2 days. Patient had at least 2 episodes of nausea vomiting denies any diarrhea. Pain is mostly in the upper outer and on exam was tender. Denies any chest pain shortness of breath or productive cough. Patient also had fever chills last 2 days. Has denied any increasing frequency of urination. In the ER patient was febrile with temperature of 102F lactic acid was around 3 with leukocytosis tachycardic patient was given fluid bolus according to her sepsis protocol and was started on empiric antibiotics for sepsis. UA shows features consistent with UTI. In December urine cultures showed Escherichia coli which was only sensitive to imipenem and gentamicin and Zosyn. Patient has been admitted for sepsis secondary to UTI. CT abdomen and pelvis is pending at this time.   ED Course: Fluid bolus empiric antibiotics were given.  Review of Systems: As per HPI, rest all negative.   Past Medical History  Diagnosis Date  . COPD (chronic obstructive pulmonary disease) ([place])   . Heart failure ([place])   . Hemiplegia ([place])   . Hemiparesis ([place])   . Thyroid disease   . Depression   . Bipolar 1 disorder, depressed ([place])   . Gastroesophageal reflux   . Stroke [facility])     History reviewed. No pertinent past surgical history.   reports that she quit smoking about 12 years ago. Her smoking use included Cigarettes. She smoked 0.50 packs per day. She has never used smokeless tobacco. She reports that she does not drink alcohol. Her drug history is not on  file.  Allergies  Allergen Reactions  . Latex Other (See Comments)    unknown    Family History  Problem Relation Age of Onset  . Hypertension Other     Prior to Admission medications   Medication Sig Start Date End Date Taking? Authorizing Provider  acetaminophen (TYLENOL) 325 MG tablet Take 650 mg by mouth every 4 (four) hours as needed for mild pain. Reported on 01/17/2016   Yes Historical Provider, MD  acyclovir (ZOVIRAX) 400 MG tablet Take 400 mg by mouth 2 (two) times daily.   Yes Historical Provider, MD  aspirin EC 81 MG tablet Take 81 mg by mouth daily.   Yes Historical Provider, MD  calcium-vitamin D (OSCAL WITH D) 500-200 MG-UNIT per tablet Take 1 tablet by mouth daily with breakfast.   Yes Historical Provider, MD  cetirizine (ZYRTEC) 10 MG tablet Take 10 mg by mouth daily.   Yes Historical Provider, MD  cholecalciferol (VITAMIN D) 1000 units tablet Take 1,000 Units by mouth daily.   Yes Historical Provider, MD  DULoxetine (CYMBALTA) 60 MG capsule Take 60 mg by mouth at bedtime.    Yes Historical Provider, MD  ferrous sulfate 325 (65 FE) MG tablet Take 325 mg by mouth 2 (two) times daily with a meal.   Yes Historical Provider, MD  fluticasone (FLONASE) 50 MCG/ACT nasal spray Place 1 spray into both nostrils at bedtime.   Yes Historical Provider, MD  furosemide (LASIX) 20 MG tablet Take 20 mg by mouth daily.    Yes Historical Provider, MD  guaiFENesin (MUCINEX) 600 MG 12 hr tablet Take 600 mg by mouth 2 (two) times daily.   Yes Historical Provider, MD  hydrOXYzine (ATARAX/VISTARIL) 50 MG tablet Take 50 mg by mouth at bedtime.    Yes Historical Provider, MD  ipratropium-albuterol (DUONEB) 0.5-2.5 (3) MG/3ML SOLN Take 3 mLs by nebulization 3 (three) times daily as needed.   Yes Historical Provider, MD  isosorbide mononitrate (IMDUR) 30 MG 24 hr tablet Take 45 mg by mouth daily.   Yes Historical Provider, MD  latanoprost (XALATAN) 0.005 % ophthalmic solution Place 1 drop into both  eyes at bedtime. Reported on 01/17/2016   Yes Historical Provider, MD  levothyroxine (SYNTHROID, LEVOTHROID) 200 MCG tablet Take 200 mcg by mouth daily before breakfast.   Yes Historical Provider, MD  LORazepam (ATIVAN) 1 MG tablet Take 1 mg by mouth every 6 (six) hours as needed for anxiety.   Yes Historical Provider, MD  metoprolol tartrate (LOPRESSOR) 25 MG tablet Take 1 tablet (25 mg total) by mouth 2 (two) times daily. 05/16/15  Yes Arnoldo Lenis, MD  Multiple Vitamin (MULTIVITAMIN WITH MINERALS) TABS tablet Take 1 tablet by mouth daily.   Yes Historical Provider, MD  nitroGLYCERIN (NITROSTAT) 0.4 MG SL tablet Place 0.4 mg under the tongue every 5 (five) minutes as needed for chest pain.   Yes Historical Provider, MD  nortriptyline (PAMELOR) 50 MG capsule Take 50 mg by mouth at bedtime.   Yes Historical Provider, MD  omeprazole (PRILOSEC) 20 MG capsule Take 20 mg by mouth daily.   Yes Historical Provider, MD  ondansetron (ZOFRAN) 4 MG tablet Take 4 mg by mouth every 8 (eight) hours as needed for nausea or vomiting.   Yes Historical Provider, MD  oxyCODONE-acetaminophen (PERCOCET/ROXICET) 5-325 MG tablet Take 1 tablet by mouth every 6 (six) hours as needed for moderate pain or severe pain.   Yes Historical Provider, MD  phenytoin (DILANTIN) 200 MG ER capsule Take 200 mg by mouth 3 (three) times daily.   Yes Historical Provider, MD  pravastatin (PRAVACHOL) 80 MG tablet Take 80 mg by mouth daily.   Yes Historical Provider, MD  pregabalin (LYRICA) 75 MG capsule Take 75 mg by mouth daily.   Yes Historical Provider, MD  Propylene Glycol (SYSTANE BALANCE) 0.6 % SOLN Place 1 drop into both eyes 2 (two) times daily. Both eyes   Yes Historical Provider, MD  senna (SENOKOT) 8.6 MG TABS tablet Take 1 tablet by mouth 2 (two) times daily.   Yes Historical Provider, MD  tizanidine (ZANAFLEX) 2 MG capsule Take 2 mg by mouth every 6 (six) hours as needed for muscle spasms.   Yes Historical Provider, MD  vitamin  C (ASCORBIC ACID) 500 MG tablet Take 500 mg by mouth 2 (two) times daily.   Yes Historical Provider, MD  warfarin (COUMADIN) 2 MG tablet Take 7 mg by mouth every Monday, Wednesday, and Friday.   Yes Historical Provider, MD  warfarin (COUMADIN) 6 MG tablet Take 6 mg by mouth See admin instructions. Take on Sun, Tue, Thur, and Sat.   Yes Historical Provider, MD    Physical Exam: Filed Vitals:   03/23/16 1530 03/23/16 1600 03/23/16 1630 03/23/16 1726  BP: 116/94 141/79 112/53 125/62  Pulse:  102 105 105  Temp:      TempSrc:      Resp: 25 23 22 27   Height:      Weight:      SpO2:  100% 99% 98%  Constitutional: Not in distress. Filed Vitals:   03/23/16 1530 03/23/16 1600 03/23/16 1630 03/23/16 1726  BP: 116/94 141/79 112/53 125/62  Pulse:  102 105 105  Temp:      TempSrc:      Resp: 25 23 22 27   Height:      Weight:      SpO2:  100% 99% 98%   Eyes: Anicteric no pallor. ENMT: No discharge from the ears eyes nose or mouth. Neck: No mass felt. No JVD appreciated. Respiratory: No rhonchi or crepitations. Cardiovascular: S1 and S2 heard. Abdomen: Mild epigastric tenderness no guarding or rigidity. Musculoskeletal: No edema. Skin: No rash. Neurologic: Alert awake oriented to time place and person. Left-sided hemiplegia. Psychiatric: Appears normal.   Labs on Admission: I have personally reviewed following labs and imaging studies  CBC:  Recent Labs Lab 03/23/16 1432  WBC 17.5*  NEUTROABS 14.2*  HGB 15.3*  HCT 46.5*  MCV 96.3  PLT 0000000   Basic Metabolic Panel:  Recent Labs Lab 03/23/16 1432  NA 138  K 4.1  CL 101  CO2 30  GLUCOSE 119*  BUN 9  CREATININE 0.68  CALCIUM 8.7*   GFR: Estimated Creatinine Clearance: 95.1 mL/min (by C-G formula based on Cr of 0.68). Liver Function Tests:  Recent Labs Lab 03/23/16 1432  AST 23  ALT 27  ALKPHOS 158*  BILITOT 0.6  PROT 7.2  ALBUMIN 3.4*   No results for input(s): LIPASE, AMYLASE in the last 168  hours. No results for input(s): AMMONIA in the last 168 hours. Coagulation Profile:  Recent Labs Lab 03/23/16 1432  INR 2.77*   Cardiac Enzymes: No results for input(s): CKTOTAL, CKMB, CKMBINDEX, TROPONINI in the last 168 hours. BNP (last 3 results) No results for input(s): PROBNP in the last 8760 hours. HbA1C: No results for input(s): HGBA1C in the last 72 hours. CBG: No results for input(s): GLUCAP in the last 168 hours. Lipid Profile: No results for input(s): CHOL, HDL, LDLCALC, TRIG, CHOLHDL, LDLDIRECT in the last 72 hours. Thyroid Function Tests: No results for input(s): TSH, T4TOTAL, FREET4, T3FREE, THYROIDAB in the last 72 hours. Anemia Panel: No results for input(s): VITAMINB12, FOLATE, FERRITIN, TIBC, IRON, RETICCTPCT in the last 72 hours. Urine analysis:    Component Value Date/Time   COLORURINE YELLOW 03/23/2016 1512   APPEARANCEUR CLOUDY* 03/23/2016 1512   LABSPEC 1.020 03/23/2016 1512   PHURINE 5.5 03/23/2016 1512   GLUCOSEU NEGATIVE 03/23/2016 1512   HGBUR LARGE* 03/23/2016 1512   [place] 03/23/2016 1512   [place] 03/23/2016 1512   PROTEINUR 30* 03/23/2016 1512   NITRITE POSITIVE* 03/23/2016 1512   LEUKOCYTESUR LARGE* 03/23/2016 1512   Sepsis Labs: @LABRCNTIP (procalcitonin:4,lacticidven:4) ) Recent Results (from the past 240 hour(s))  Blood Culture (routine x 2)     Status: None (Preliminary result)   Collection Time: 03/23/16  2:28 PM  Result Value Ref Range Status   Specimen Description RIGHT ANTECUBITAL  Final   Special Requests BOTTLES DRAWN AEROBIC AND ANAEROBIC [facility] EACH  Final   Culture PENDING  Incomplete   Report Status PENDING  Incomplete  Blood Culture (routine x 2)     Status: None (Preliminary result)   Collection Time: 03/23/16  2:30 PM  Result Value Ref Range Status   Specimen Description RIGHT ANTECUBITAL  Final   Special Requests BOTTLES DRAWN AEROBIC AND ANAEROBIC [place]  Final   Culture PENDING  Incomplete    Report Status PENDING  Incomplete     Radiological Exams  on Admission: Dg Chest 1 View  03/23/2016  CLINICAL DATA:  Fever. EXAM: CHEST 1 VIEW COMPARISON:  September 19, 2015 x-ray FINDINGS: Stable cardiomegaly. No pneumothorax. No pulmonary nodules, masses, or infiltrates. No overt edema. IMPRESSION: No active disease. Electronically Signed   By: Dorise Bullion III M.D   On: 03/23/2016 14:59    Assessment/Plan Principal Problem:   Sepsis ([place]) Active Problems:   COPD (chronic obstructive pulmonary disease) ([place])   Hx of CABG   HTN (hypertension)   UTI (lower urinary tract infection)    #1. Sepsis secondary to UTI - patient has been placed on Zosyn. Follow urine cultures blood cultures procalcitonin O's lactic acid levels continue hydration and hold Lasix for now. Since patient has abdominal tenderness around the epigastric area and upper quadrants CT of the abdomen and pelvis has been ordered which is pending. For now I have placed patient on clear liquid diet and if tolerates and if CT is unremarkable can advance. #2. History of stroke with left-sided hemiplegia on Coumadin - until we get CT results I have placed patient on heparin if INR is less than 2. CT is unremarkable and no procedures anticipated they may change to Coumadin. #3. CAD status post CABG - denies any chest pain however patient had epigastric pain we'll check troponin. Continue metoprolol aspirin and statins. Continue Imdur. #4. COPD presently not wheezing continuing inhalers. #5. History of seizures on phenytoin. Check Dilantin levels. #6. Hypothyroidism on Synthroid.   DVT prophylaxis: On heparin infusion. Code Status: Full code.  Family Communication: No family at the bedside.  Disposition Plan: Skilled nursing facility.  Consults called: None.  Admission status: Inpatient. Telemetry. Likely stay 2-3 days.    Rise Patience MD [facility] Pager [phone].  If 7PM-7AM, please contact  night-coverage [email or web address] Password [email or web address]  03/23/2016, 6:30 PM

## 2016-03-23 NOTE — Progress Notes (Signed)
ANTICOAGULATION CONSULT NOTE - Initial Consult  Pharmacy Consult for Heparin Indication: stroke  Allergies  Allergen Reactions  . Latex Other (See Comments)    unknown    Patient Measurements: Height: 5\' 6"  (167.6 cm) Weight: 265 lb (120.203 kg) IBW/kg (Calculated) : 59.3 Heparin Dosing Weight: 87.9 kg  Vital Signs: Temp: 102.5 F (39.2 C) (06/10 1414) Temp Source: Oral (06/10 1414) BP: 125/62 mmHg (06/10 1726) Pulse Rate: 105 (06/10 1726)  Labs:  Recent Labs  03/23/16 1432  HGB 15.3*  HCT 46.5*  PLT 178  LABPROT 28.8*  INR 2.77*  CREATININE 0.68    Estimated Creatinine Clearance: 95.1 mL/min (by C-G formula based on Cr of 0.68).   Medical History: Past Medical History  Diagnosis Date  . COPD (chronic obstructive pulmonary disease) ([place])   . Heart failure ([place])   . Hemiplegia ([place])   . Hemiparesis ([place])   . Thyroid disease   . Depression   . Bipolar 1 disorder, depressed ([place])   . Gastroesophageal reflux   . Stroke [facility])     Medications:  Scheduled:  . aspirin EC  81 mg Oral Daily  . [START ON 03/24/2016] calcium-vitamin D  1 tablet Oral Q breakfast  . cholecalciferol  1,000 Units Oral Daily  . diatrizoate meglumine-sodium      . DULoxetine  60 mg Oral QHS  . [START ON 03/24/2016] ferrous sulfate  325 mg Oral BID WC  . fluticasone  1 spray Each Nare QHS  . guaiFENesin  600 mg Oral BID  . hydrOXYzine  50 mg Oral QHS  . isosorbide mononitrate  45 mg Oral Daily  . latanoprost  1 drop Both Eyes QHS  . [START ON 03/24/2016] levothyroxine  200 mcg Oral QAC breakfast  . loratadine  10 mg Oral Daily  . metoprolol tartrate  25 mg Oral BID  . [START ON 03/24/2016] multivitamin with minerals  1 tablet Oral Daily  . nortriptyline  50 mg Oral QHS  . pantoprazole  40 mg Oral Daily  . phenytoin  200 mg Oral Q8H  . piperacillin-tazobactam (ZOSYN)  IV  3.375 g Intravenous Q8H  . pravastatin  80 mg Oral q1800  . pregabalin  75 mg Oral Daily  . senna  1 tablet  Oral BID  . vitamin C  500 mg Oral BID    Assessment: 65 yo female, history of stroke with left-sided hemiplegia. PTA Coumadin INR 2.77 today Heparin per pharmacy when INR less than 2  Goal of Therapy:  Heparin level 0.3-0.5 units/ml Monitor platelets by anticoagulation protocol: Yes   Plan:  No heparin at this time due to INR > 2 INR in AM Start heparin when INR < 2  Abner Greenspan, Kobie Whidby [place] 03/23/2016,7:45 PM

## 2016-03-23 NOTE — Progress Notes (Signed)
Pharmacy Antibiotic Note  Karen Mccann is a 65 y.o. female admitted on 03/23/2016 with sepsis.  Pharmacy has been consulted for Vancomycin and Zosyn dosing.  Plan: Vancomycin 2 GM IV loading dose and Zosyn 3.375 GM IV in ED Vancomycin 1250 IV every 12 hours.  Goal trough 15-20 mcg/mL. Zosyn 3.375g IV q8h (4 hour infusion).  Vancomycin trough at steady state Labs per protocol  Height: 5\' 6"  (167.6 cm) Weight: 265 lb (120.203 kg) IBW/kg (Calculated) : 59.3  Temp (24hrs), Avg:102.5 F (39.2 C), Min:102.5 F (39.2 C), Max:102.5 F (39.2 C)   Recent Labs Lab 03/23/16 1432 03/23/16 1455  WBC 17.5*  --   CREATININE 0.68  --   LATICACIDVEN  --  3.00*    Estimated Creatinine Clearance: 95.1 mL/min (by C-G formula based on Cr of 0.68).    Allergies  Allergen Reactions  . Latex Other (See Comments)    unknown    Antimicrobials this admission: Vancomycin  >> 6/10 Zosyn  >> 6/10  Dose adjustments this admission: None   Thank you for allowing pharmacy to be a part of this patient's care.  Chriss Czar 03/23/2016 4:25 PM

## 2016-03-23 NOTE — ED Notes (Signed)
Pt comes in via EMS from [place] for lower abdominal pain. Pt is unsure the last time she made a bowel movement. [facility] gave pt colace but has not tried any other alternatives.

## 2016-03-24 DIAGNOSIS — I10 Essential (primary) hypertension: Secondary | ICD-10-CM

## 2016-03-24 LAB — CBC WITH DIFFERENTIAL/PLATELET
BASOS ABS: 0 10*3/uL (ref 0.0–0.1)
BASOS PCT: 0 %
EOS ABS: 0.1 10*3/uL (ref 0.0–0.7)
EOS PCT: 1 %
HEMATOCRIT: 41.7 % (ref 36.0–46.0)
Hemoglobin: 13.7 g/dL (ref 12.0–15.0)
Lymphocytes Relative: 20 %
Lymphs Abs: 2.6 10*3/uL (ref 0.7–4.0)
MCH: 32.1 pg (ref 26.0–34.0)
MCHC: 32.9 g/dL (ref 30.0–36.0)
MCV: 97.7 fL (ref 78.0–100.0)
MONO ABS: 1.8 10*3/uL — AB (ref 0.1–1.0)
MONOS PCT: 14 %
Neutro Abs: 8.7 10*3/uL — ABNORMAL HIGH (ref 1.7–7.7)
Neutrophils Relative %: 65 %
PLATELETS: 156 10*3/uL (ref 150–400)
RBC: 4.27 MIL/uL (ref 3.87–5.11)
RDW: 15.5 % (ref 11.5–15.5)
WBC: 13.2 10*3/uL — ABNORMAL HIGH (ref 4.0–10.5)

## 2016-03-24 LAB — BLOOD CULTURE ID PANEL (REFLEXED)
Acinetobacter baumannii: NOT DETECTED
CANDIDA PARAPSILOSIS: NOT DETECTED
CANDIDA TROPICALIS: NOT DETECTED
CARBAPENEM RESISTANCE: NOT DETECTED
Candida albicans: NOT DETECTED
Candida glabrata: NOT DETECTED
Candida krusei: NOT DETECTED
ENTEROBACTERIACEAE SPECIES: DETECTED — AB
ENTEROCOCCUS SPECIES: NOT DETECTED
Enterobacter cloacae complex: NOT DETECTED
Escherichia coli: DETECTED — AB
Haemophilus influenzae: NOT DETECTED
KLEBSIELLA PNEUMONIAE: NOT DETECTED
Klebsiella oxytoca: NOT DETECTED
Listeria monocytogenes: NOT DETECTED
Methicillin resistance: NOT DETECTED
Neisseria meningitidis: NOT DETECTED
PROTEUS SPECIES: NOT DETECTED
Pseudomonas aeruginosa: NOT DETECTED
STAPHYLOCOCCUS AUREUS BCID: NOT DETECTED
STAPHYLOCOCCUS SPECIES: NOT DETECTED
STREPTOCOCCUS PYOGENES: NOT DETECTED
Serratia marcescens: NOT DETECTED
Streptococcus agalactiae: NOT DETECTED
Streptococcus pneumoniae: NOT DETECTED
Streptococcus species: NOT DETECTED
VANCOMYCIN RESISTANCE: NOT DETECTED

## 2016-03-24 LAB — MRSA PCR SCREENING: MRSA BY PCR: NEGATIVE

## 2016-03-24 LAB — COMPREHENSIVE METABOLIC PANEL
ALBUMIN: 3 g/dL — AB (ref 3.5–5.0)
ALT: 20 U/L (ref 14–54)
ANION GAP: 7 (ref 5–15)
AST: 16 U/L (ref 15–41)
Alkaline Phosphatase: 127 U/L — ABNORMAL HIGH (ref 38–126)
BILIRUBIN TOTAL: 0.7 mg/dL (ref 0.3–1.2)
BUN: 9 mg/dL (ref 6–20)
CHLORIDE: 106 mmol/L (ref 101–111)
CO2: 26 mmol/L (ref 22–32)
Calcium: 8.6 mg/dL — ABNORMAL LOW (ref 8.9–10.3)
Creatinine, Ser: 0.76 mg/dL (ref 0.44–1.00)
GFR calc Af Amer: >60 mL/min (ref >60)
GFR calc non Af Amer: >60 mL/min (ref >60)
Glucose, Bld: 96 mg/dL (ref 65–99)
POTASSIUM: 3.6 mmol/L (ref 3.5–5.1)
SODIUM: 139 mmol/L (ref 135–145)
Total Protein: 6.4 g/dL — ABNORMAL LOW (ref 6.5–8.1)

## 2016-03-24 LAB — PROTIME-INR
INR: 2.71 — ABNORMAL HIGH (ref 0.00–1.49)
PROTHROMBIN TIME: 28.3 s — AB (ref 11.6–15.2)

## 2016-03-24 LAB — LACTIC ACID, PLASMA: LACTIC ACID, VENOUS: 2.3 mmol/L — AB (ref 0.5–2.0)

## 2016-03-24 NOTE — Progress Notes (Signed)
PHARMACY - PHYSICIAN COMMUNICATION CRITICAL VALUE ALERT - BLOOD CULTURE IDENTIFICATION (BCID)  Results for orders placed or performed during the hospital encounter of 03/23/16  Blood Culture ID Panel (Reflexed) (Collected: 03/23/2016  2:28 PM)  Result Value Ref Range   Enterococcus species NOT DETECTED NOT DETECTED   Vancomycin resistance NOT DETECTED NOT DETECTED   Listeria monocytogenes NOT DETECTED NOT DETECTED   Staphylococcus species NOT DETECTED NOT DETECTED   Staphylococcus aureus NOT DETECTED NOT DETECTED   Methicillin resistance NOT DETECTED NOT DETECTED   Streptococcus species NOT DETECTED NOT DETECTED   Streptococcus agalactiae NOT DETECTED NOT DETECTED   Streptococcus pneumoniae NOT DETECTED NOT DETECTED   Streptococcus pyogenes NOT DETECTED NOT DETECTED   Acinetobacter baumannii NOT DETECTED NOT DETECTED   Enterobacteriaceae species DETECTED (A) NOT DETECTED   Enterobacter cloacae complex NOT DETECTED NOT DETECTED   Escherichia coli DETECTED (A) NOT DETECTED   Klebsiella oxytoca NOT DETECTED NOT DETECTED   Klebsiella pneumoniae NOT DETECTED NOT DETECTED   Proteus species NOT DETECTED NOT DETECTED   Serratia marcescens NOT DETECTED NOT DETECTED   Carbapenem resistance NOT DETECTED NOT DETECTED   Haemophilus influenzae NOT DETECTED NOT DETECTED   Neisseria meningitidis NOT DETECTED NOT DETECTED   Pseudomonas aeruginosa NOT DETECTED NOT DETECTED   Candida albicans NOT DETECTED NOT DETECTED   Candida glabrata NOT DETECTED NOT DETECTED   Candida krusei NOT DETECTED NOT DETECTED   Candida parapsilosis NOT DETECTED NOT DETECTED   Candida tropicalis NOT DETECTED NOT DETECTED    Name of physician (or Provider) Contacted: Dr. Marin Comment  Changes to prescribed antibiotics required: Presently on zosyn for UTI. December urine cultures showed Eschericia Coli sensitive to Zosyn. No changes at this time  Chriss Czar 03/24/2016  10:20 AM

## 2016-03-24 NOTE — Progress Notes (Signed)
ANTICOAGULATION CONSULT NOTE -Follow up  Pharmacy Consult for Heparin Indication: stroke  Allergies  Allergen Reactions  . Latex Other (See Comments)    unknown    Patient Measurements: Height: 5\' 6"  (167.6 cm) Weight: 271 lb 6.2 oz (123.1 kg) IBW/kg (Calculated) : 59.3 Heparin Dosing Weight: 87.9 kg  Vital Signs: Temp: 98.1 F (36.7 C) (06/11 0457) Temp Source: Oral (06/11 0457) BP: 124/60 mmHg (06/11 0457) Pulse Rate: 95 (06/11 0457)  Labs:  Recent Labs  03/23/16 1432 03/23/16 2029 03/24/16 0534  HGB 15.3*  --  13.7  HCT 46.5*  --  41.7  PLT 178  --  156  APTT  --  48*  --   LABPROT 28.8*  --  28.3*  INR 2.77*  --  2.71*  CREATININE 0.68  --  0.76  TROPONINI  --  0.03  --     Estimated Creatinine Clearance: 96.4 mL/min (by C-G formula based on Cr of 0.76).   Medical History: Past Medical History  Diagnosis Date  . COPD (chronic obstructive pulmonary disease) ([place])   . Heart failure ([place])   . Hemiplegia ([place])   . Hemiparesis ([place])   . Thyroid disease   . Depression   . Bipolar 1 disorder, depressed ([place])   . Gastroesophageal reflux   . Stroke [facility])     Medications:  Scheduled:  . aspirin EC  81 mg Oral Daily  . calcium-vitamin D  1 tablet Oral Q breakfast  . cholecalciferol  1,000 Units Oral Daily  . DULoxetine  60 mg Oral QHS  . ferrous sulfate  325 mg Oral BID WC  . fluticasone  1 spray Each Nare QHS  . guaiFENesin  600 mg Oral BID  . hydrOXYzine  50 mg Oral QHS  . isosorbide mononitrate  45 mg Oral Daily  . latanoprost  1 drop Both Eyes QHS  . levothyroxine  200 mcg Oral QAC breakfast  . loratadine  10 mg Oral Daily  . metoprolol tartrate  25 mg Oral BID  . multivitamin with minerals  1 tablet Oral Daily  . nortriptyline  50 mg Oral QHS  . pantoprazole  40 mg Oral Daily  . phenytoin  200 mg Oral Q8H  . piperacillin-tazobactam (ZOSYN)  IV  3.375 g Intravenous Q8H  . pravastatin  80 mg Oral q1800  . pregabalin  75 mg Oral Daily  .  senna  1 tablet Oral BID  . vitamin C  500 mg Oral BID    Assessment: 65 yo female, history of stroke with left-sided hemiplegia. PTA Coumadin INR 2.71 today Heparin per pharmacy when INR less than 2  Goal of Therapy:  Heparin level 0.3-0.5 units/ml Monitor platelets by anticoagulation protocol: Yes   Plan:  No heparin at this time due to INR > 2 INR in AM Start heparin when INR < 2  Abner Greenspan, Mandrell Vangilder Bennett 03/24/2016,11:38 AM

## 2016-03-24 NOTE — Progress Notes (Signed)
Patient blood cultures is showing gram negative rods. Reported to Dr Shanon Brow, no new orders given at time. Patient is currently on Zosyn 2x daily

## 2016-03-24 NOTE — Progress Notes (Signed)
[facility] PROGRESS NOTE  Karen Mccann [id number] DOB: 1950-11-10    PCP:   [name], MD   HPI:  Karen Mccann is a 65 y.o. female with medical history significant of stroke with left-sided hemiplegia, CAD status post CABG, hypothyroidism, hyperlipidemia, seizures presents to the ER because of increasing abdominal pain and nausea vomiting over the last 2 days.   She was subsequently admitted for sepsis due to UTI, and was started on IV Zosyn.  She was feeling better.  Her Blood culture grew E Coli, and antibiotics were continued.  She has no other complaints.   Rewiew of Systems:  Constitutional: Negative for malaise, fever and chills. No significant weight loss or weight gain Eyes: Negative for eye pain, redness and discharge, diplopia, visual changes, or flashes of light. ENMT: Negative for ear pain, hoarseness, nasal congestion, sinus pressure and sore throat. No headaches; tinnitus, drooling, or problem swallowing. Cardiovascular: Negative for chest pain, palpitations, diaphoresis, dyspnea and peripheral edema. ; No orthopnea, PND Respiratory: Negative for cough, hemoptysis, wheezing and stridor. No pleuritic chestpain. Gastrointestinal: Negative for nausea, vomiting, diarrhea, constipation, abdominal pain, melena, blood in stool, hematemesis, jaundice and rectal bleeding.    Genitourinary: Negative for frequency, dysuria, incontinence,flank pain and hematuria; Musculoskeletal: Negative for back pain and neck pain. Negative for swelling and trauma.;  Skin: . Negative for pruritus, rash, abrasions, bruising and skin lesion.; ulcerations Neuro: Negative for headache, lightheadedness and neck stiffness. Negative for weakness, altered level of consciousness , altered mental status, extremity weakness, burning feet, involuntary movement, seizure and syncope.  Psych: negative for anxiety, depression, insomnia, tearfulness, panic attacks, hallucinations, paranoia, suicidal or  homicidal ideation   Past Medical History  Diagnosis Date  . COPD (chronic obstructive pulmonary disease) ([place])   . Heart failure ([place])   . Hemiplegia ([place])   . Hemiparesis ([place])   . Thyroid disease   . Depression   . Bipolar 1 disorder, depressed ([place])   . Gastroesophageal reflux   . Stroke [facility])     History reviewed. No pertinent past surgical history.  Medications:  HOME MEDS: Prior to Admission medications   Medication Sig Start Date End Date Taking? Authorizing Provider  acetaminophen (TYLENOL) 325 MG tablet Take 650 mg by mouth every 4 (four) hours as needed for mild pain. Reported on 01/17/2016   Yes Historical Provider, MD  acyclovir (ZOVIRAX) 400 MG tablet Take 400 mg by mouth 2 (two) times daily.   Yes Historical Provider, MD  aspirin EC 81 MG tablet Take 81 mg by mouth daily.   Yes Historical Provider, MD  calcium-vitamin D (OSCAL WITH D) 500-200 MG-UNIT per tablet Take 1 tablet by mouth daily with breakfast.   Yes Historical Provider, MD  cetirizine (ZYRTEC) 10 MG tablet Take 10 mg by mouth daily.   Yes Historical Provider, MD  cholecalciferol (VITAMIN D) 1000 units tablet Take 1,000 Units by mouth daily.   Yes Historical Provider, MD  DULoxetine (CYMBALTA) 60 MG capsule Take 60 mg by mouth at bedtime.    Yes Historical Provider, MD  ferrous sulfate 325 (65 FE) MG tablet Take 325 mg by mouth 2 (two) times daily with a meal.   Yes Historical Provider, MD  fluticasone (FLONASE) 50 MCG/ACT nasal spray Place 1 spray into both nostrils at bedtime.   Yes Historical Provider, MD  furosemide (LASIX) 20 MG tablet Take 20 mg by mouth daily.    Yes Historical Provider, MD  guaiFENesin (MUCINEX) 600 MG 12 hr tablet Take  600 mg by mouth 2 (two) times daily.   Yes Historical Provider, MD  hydrOXYzine (ATARAX/VISTARIL) 50 MG tablet Take 50 mg by mouth at bedtime.    Yes Historical Provider, MD  ipratropium-albuterol (DUONEB) 0.5-2.5 (3) MG/3ML SOLN Take 3 mLs by nebulization 3 (three)  times daily as needed.   Yes Historical Provider, MD  isosorbide mononitrate (IMDUR) 30 MG 24 hr tablet Take 45 mg by mouth daily.   Yes Historical Provider, MD  latanoprost (XALATAN) 0.005 % ophthalmic solution Place 1 drop into both eyes at bedtime. Reported on 01/17/2016   Yes Historical Provider, MD  levothyroxine (SYNTHROID, LEVOTHROID) 200 MCG tablet Take 200 mcg by mouth daily before breakfast.   Yes Historical Provider, MD  LORazepam (ATIVAN) 1 MG tablet Take 1 mg by mouth every 6 (six) hours as needed for anxiety.   Yes Historical Provider, MD  metoprolol tartrate (LOPRESSOR) 25 MG tablet Take 1 tablet (25 mg total) by mouth 2 (two) times daily. 05/16/15  Yes Arnoldo Lenis, MD  Multiple Vitamin (MULTIVITAMIN WITH MINERALS) TABS tablet Take 1 tablet by mouth daily.   Yes Historical Provider, MD  nitroGLYCERIN (NITROSTAT) 0.4 MG SL tablet Place 0.4 mg under the tongue every 5 (five) minutes as needed for chest pain.   Yes Historical Provider, MD  nortriptyline (PAMELOR) 50 MG capsule Take 50 mg by mouth at bedtime.   Yes Historical Provider, MD  omeprazole (PRILOSEC) 20 MG capsule Take 20 mg by mouth daily.   Yes Historical Provider, MD  ondansetron (ZOFRAN) 4 MG tablet Take 4 mg by mouth every 8 (eight) hours as needed for nausea or vomiting.   Yes Historical Provider, MD  oxyCODONE-acetaminophen (PERCOCET/ROXICET) 5-325 MG tablet Take 1 tablet by mouth every 6 (six) hours as needed for moderate pain or severe pain.   Yes Historical Provider, MD  phenytoin (DILANTIN) 200 MG ER capsule Take 200 mg by mouth 3 (three) times daily.   Yes Historical Provider, MD  pravastatin (PRAVACHOL) 80 MG tablet Take 80 mg by mouth daily.   Yes Historical Provider, MD  pregabalin (LYRICA) 75 MG capsule Take 75 mg by mouth daily.   Yes Historical Provider, MD  Propylene Glycol (SYSTANE BALANCE) 0.6 % SOLN Place 1 drop into both eyes 2 (two) times daily. Both eyes   Yes Historical Provider, MD  senna (SENOKOT)  8.6 MG TABS tablet Take 1 tablet by mouth 2 (two) times daily.   Yes Historical Provider, MD  tizanidine (ZANAFLEX) 2 MG capsule Take 2 mg by mouth every 6 (six) hours as needed for muscle spasms.   Yes Historical Provider, MD  vitamin C (ASCORBIC ACID) 500 MG tablet Take 500 mg by mouth 2 (two) times daily.   Yes Historical Provider, MD  warfarin (COUMADIN) 2 MG tablet Take 7 mg by mouth every Monday, Wednesday, and Friday.   Yes Historical Provider, MD  warfarin (COUMADIN) 6 MG tablet Take 6 mg by mouth See admin instructions. Take on Sun, Tue, Thur, and Sat.   Yes Historical Provider, MD     Allergies:  Allergies  Allergen Reactions  . Latex Other (See Comments)    unknown    Social History:   reports that she quit smoking about 12 years ago. Her smoking use included Cigarettes. She smoked 0.50 packs per day. She has never used smokeless tobacco. She reports that she does not drink alcohol. Her drug history is not on file.  Family History: Family History  Problem Relation Age of  Onset  . Hypertension Other      Physical Exam: Filed Vitals:   03/23/16 1630 03/23/16 1726 03/23/16 2141 03/24/16 0457  BP: 112/53 125/62 120/91 124/60  Pulse: 105 105 109 95  Temp:   100.5 F (38.1 C) 98.1 F (36.7 C)  TempSrc:   Oral Oral  Resp: 22 27 22 20   Height:      Weight:    123.1 kg (271 lb 6.2 oz)  SpO2: 99% 98% 92% 100%   Blood pressure 124/60, pulse 95, temperature 98.1 F (36.7 C), temperature source Oral, resp. rate 20, height 5\' 6"  (1.676 m), weight 123.1 kg (271 lb 6.2 oz), SpO2 100 %.  GEN:  Pleasant  patient lying in the stretcher in no acute distress; cooperative with exam. PSYCH:  alert and oriented x4; does not appear anxious or depressed; affect is appropriate. HEENT: Mucous membranes pink and anicteric; PERRLA; EOM intact; no cervical lymphadenopathy nor thyromegaly or carotid bruit; no JVD; There were no stridor. Neck is very supple. Breasts:: Not examined CHEST WALL:  No tenderness CHEST: Normal respiration, clear to auscultation bilaterally.  HEART: Regular rate and rhythm.  There are no murmur, rub, or gallops.   BACK: No kyphosis or scoliosis; no CVA tenderness ABDOMEN: soft and non-tender; no masses, no organomegaly, normal abdominal bowel sounds; no pannus; no intertriginous candida. There is no rebound and no distention. Rectal Exam: Not done EXTREMITIES: No bone or joint deformity; age-appropriate arthropathy of the hands and knees; no edema; no ulcerations.  There is no calf tenderness. Genitalia: not examined PULSES: 2+ and symmetric SKIN: Normal hydration no rash or ulceration CNS: Cranial nerves 2-12 grossly intact no focal lateralizing neurologic deficit.  Speech is fluent; uvula elevated with phonation, facial symmetry and tongue midline. DTR are normal bilaterally, cerebella exam is intact, barbinski is negative and strengths are equaled bilaterally.  No sensory loss.   Labs on Admission:  Basic Metabolic Panel:  Recent Labs Lab 03/23/16 1432 03/24/16 0534  NA 138 139  K 4.1 3.6  CL 101 106  CO2 30 26  GLUCOSE 119* 96  BUN 9 9  CREATININE 0.68 0.76  CALCIUM 8.7* 8.6*   Liver Function Tests:  Recent Labs Lab 03/23/16 1432 03/24/16 0534  AST 23 16  ALT 27 20  ALKPHOS 158* 127*  BILITOT 0.6 0.7  PROT 7.2 6.4*  ALBUMIN 3.4* 3.0*  CBC:  Recent Labs Lab 03/23/16 1432 03/24/16 0534  WBC 17.5* 13.2*  NEUTROABS 14.2* 8.7*  HGB 15.3* 13.7  HCT 46.5* 41.7  MCV 96.3 97.7  PLT 178 156   Cardiac Enzymes:  Recent Labs Lab 03/23/16 2029  TROPONINI 0.03    CBG: No results for input(s): GLUCAP in the last 168 hours.   Radiological Exams on Admission: Dg Chest 1 View  03/23/2016  CLINICAL DATA:  Fever. EXAM: CHEST 1 VIEW COMPARISON:  September 19, 2015 x-ray FINDINGS: Stable cardiomegaly. No pneumothorax. No pulmonary nodules, masses, or infiltrates. No overt edema. IMPRESSION: No active disease. Electronically Signed    By: Dorise Bullion III M.D   On: 03/23/2016 14:59   Assessment/Plan Present on Admission:  . Sepsis ([place]) . UTI (lower urinary tract infection) . HTN (hypertension) . COPD (chronic obstructive pulmonary disease) ([facility])  PLAN:  Sepsis due to UTI:  Will continue with IV Zosyn, IVF.  GNR in blood culture.  She is doing better.   HTN:  BP is OK.  COPD Stable. Continue with meds.   Other plans as per  orders. Code Status: FULL Haskel Khan, MD.  FACP [facility] Pager [phone] 7pm to 7am.  03/24/2016, 11:27 AM

## 2016-03-25 ENCOUNTER — Telehealth: Payer: Self-pay | Admitting: "Endocrinology

## 2016-03-25 DIAGNOSIS — Z951 Presence of aortocoronary bypass graft: Secondary | ICD-10-CM

## 2016-03-25 LAB — PROTIME-INR
INR: 2.46 — ABNORMAL HIGH (ref 0.00–1.49)
Prothrombin Time: 26.4 seconds — ABNORMAL HIGH (ref 11.6–15.2)

## 2016-03-25 LAB — URINE CULTURE: Culture: >=100000 — AB

## 2016-03-25 MED ORDER — SODIUM CHLORIDE 0.9 % IV SOLN
500.0000 mg | Freq: Four times a day (QID) | INTRAVENOUS | Status: DC
  Administered 2016-03-25 – 2016-03-27 (×10): 500 mg via INTRAVENOUS
  Filled 2016-03-25 (×13): qty 500

## 2016-03-25 NOTE — Progress Notes (Signed)
ANTICOAGULATION CONSULT NOTE -Follow up  Pharmacy Consult for Heparin Indication: stroke  Allergies  Allergen Reactions  . Latex Other (See Comments)    unknown   Patient Measurements: Height: 5\' 6"  (167.6 cm) Weight: 269 lb 2.9 oz (122.1 kg) IBW/kg (Calculated) : 59.3 HEPARIN DW (KG): 87.9  Vital Signs: Temp: 98.9 F (37.2 C) (06/12 0650) Temp Source: Oral (06/12 0650) BP: 132/74 mmHg (06/12 0650) Pulse Rate: 92 (06/12 0650)  Labs:  Recent Labs  03/23/16 1432 03/23/16 2029 03/24/16 0534 03/25/16 0500  HGB 15.3*  --  13.7  --   HCT 46.5*  --  41.7  --   PLT 178  --  156  --   APTT  --  48*  --   --   LABPROT 28.8*  --  28.3* 26.4*  INR 2.77*  --  2.71* 2.46*  CREATININE 0.68  --  0.76  --   TROPONINI  --  0.03  --   --     Estimated Creatinine Clearance: 95.9 mL/min (by C-G formula based on Cr of 0.76).   Medical History: Past Medical History  Diagnosis Date  . COPD (chronic obstructive pulmonary disease) ([place])   . Heart failure ([place])   . Hemiplegia ([place])   . Hemiparesis ([place])   . Thyroid disease   . Depression   . Bipolar 1 disorder, depressed ([place])   . Gastroesophageal reflux   . Stroke [facility])     Medications:  Scheduled:  . aspirin EC  81 mg Oral Daily  . calcium-vitamin D  1 tablet Oral Q breakfast  . cholecalciferol  1,000 Units Oral Daily  . DULoxetine  60 mg Oral QHS  . ferrous sulfate  325 mg Oral BID WC  . fluticasone  1 spray Each Nare QHS  . guaiFENesin  600 mg Oral BID  . hydrOXYzine  50 mg Oral QHS  . isosorbide mononitrate  45 mg Oral Daily  . latanoprost  1 drop Both Eyes QHS  . levothyroxine  200 mcg Oral QAC breakfast  . loratadine  10 mg Oral Daily  . metoprolol tartrate  25 mg Oral BID  . multivitamin with minerals  1 tablet Oral Daily  . nortriptyline  50 mg Oral QHS  . pantoprazole  40 mg Oral Daily  . phenytoin  200 mg Oral Q8H  . piperacillin-tazobactam (ZOSYN)  IV  3.375 g Intravenous Q8H  . pravastatin  80 mg Oral  q1800  . pregabalin  75 mg Oral Daily  . senna  1 tablet Oral BID  . vitamin C  500 mg Oral BID    Assessment: 65 yo female, history of stroke with left-sided hemiplegia. PTA Coumadin INR remains elevated today Heparin per pharmacy when INR less than 2  Goal of Therapy:  Heparin level 0.3-0.5 units/ml Monitor platelets by anticoagulation protocol: Yes   Plan:  No heparin at this time due to INR > 2 INR in AM Start heparin when INR < 2  Karen Mccann 03/25/2016,9:19 AM

## 2016-03-25 NOTE — Telephone Encounter (Signed)
Amy, pts daughter would like to know the results of her mother's biopsy - please call back with results

## 2016-03-25 NOTE — Telephone Encounter (Signed)
Thyroid biopsy did not show any cancer. We'll discuss it when she returns for follow-up.

## 2016-03-25 NOTE — Telephone Encounter (Signed)
Notified. 

## 2016-03-25 NOTE — Progress Notes (Signed)
[facility] PROGRESS NOTE  [name] Karen Mccann DOB: 1951/04/06    PCP:   Karen Gravel, MD   HPI:  Karen Mccann is a 65 y.o. female with medical history significant of stroke with left-sided hemiplegia, CAD status post CABG, hypothyroidism, hyperlipidemia, seizures presents to the ER because of increasing abdominal pain and nausea vomiting over the last 2 days. She was subsequently admitted for sepsis due to UTI, and was started on IV Zosyn. She was feeling better. Her Blood culture grew E Coli, and antibiotics were continued. She has no other complaints. Noted urine E Coli was resistant to ampicillin, Rocephin, Cipro, and Bactrim, but is sensitive to Zosyn and Unasyn.  She had apparently thyroid biopsy, result pending.    Rewiew of Systems:  Constitutional: Negative for malaise, fever and chills. No significant weight loss or weight gain Eyes: Negative for eye pain, redness and discharge, diplopia, visual changes, or flashes of light. ENMT: Negative for ear pain, hoarseness, nasal congestion, sinus pressure and sore throat. No headaches; tinnitus, drooling, or problem swallowing. Cardiovascular: Negative for chest pain, palpitations, diaphoresis, dyspnea and peripheral edema. ; No orthopnea, PND Respiratory: Negative for cough, hemoptysis, wheezing and stridor. No pleuritic chestpain. Gastrointestinal: Negative for nausea, vomiting, diarrhea, constipation, abdominal pain, melena, blood in stool, hematemesis, jaundice and rectal bleeding.    Genitourinary: Negative for frequency, dysuria, incontinence,flank pain and hematuria; Musculoskeletal: Negative for back pain and neck pain. Negative for swelling and trauma.;  Skin: . Negative for pruritus, rash, abrasions, bruising and skin lesion.; ulcerations Neuro: Negative for headache, lightheadedness and neck stiffness. Negative for weakness, altered level of consciousness , altered mental status, extremity weakness, burning  feet, involuntary movement, seizure and syncope.  Psych: negative for anxiety, depression, insomnia, tearfulness, panic attacks, hallucinations, paranoia, suicidal or homicidal ideation    Past Medical History  Diagnosis Date  . COPD (chronic obstructive pulmonary disease) ([place])   . Heart failure ([place])   . Hemiplegia ([place])   . Hemiparesis ([place])   . Thyroid disease   . Depression   . Bipolar 1 disorder, depressed ([place])   . Gastroesophageal reflux   . Stroke [facility])     History reviewed. No pertinent past surgical history.  Medications:  HOME MEDS: Prior to Admission medications   Medication Sig Start Date End Date Taking? Authorizing Provider  acetaminophen (TYLENOL) 325 MG tablet Take 650 mg by mouth every 4 (four) hours as needed for mild pain. Reported on 01/17/2016   Yes Historical Provider, MD  acyclovir (ZOVIRAX) 400 MG tablet Take 400 mg by mouth 2 (two) times daily.   Yes Historical Provider, MD  aspirin EC 81 MG tablet Take 81 mg by mouth daily.   Yes Historical Provider, MD  calcium-vitamin D (OSCAL WITH D) 500-200 MG-UNIT per tablet Take 1 tablet by mouth daily with breakfast.   Yes Historical Provider, MD  cetirizine (ZYRTEC) 10 MG tablet Take 10 mg by mouth daily.   Yes Historical Provider, MD  cholecalciferol (VITAMIN D) 1000 units tablet Take 1,000 Units by mouth daily.   Yes Historical Provider, MD  DULoxetine (CYMBALTA) 60 MG capsule Take 60 mg by mouth at bedtime.    Yes Historical Provider, MD  ferrous sulfate 325 (65 FE) MG tablet Take 325 mg by mouth 2 (two) times daily with a meal.   Yes Historical Provider, MD  fluticasone (FLONASE) 50 MCG/ACT nasal spray Place 1 spray into both nostrils at bedtime.   Yes Historical Provider, MD  furosemide (LASIX) 20  MG tablet Take 20 mg by mouth daily.    Yes Historical Provider, MD  guaiFENesin (MUCINEX) 600 MG 12 hr tablet Take 600 mg by mouth 2 (two) times daily.   Yes Historical Provider, MD  hydrOXYzine (ATARAX/VISTARIL) 50  MG tablet Take 50 mg by mouth at bedtime.    Yes Historical Provider, MD  ipratropium-albuterol (DUONEB) 0.5-2.5 (3) MG/3ML SOLN Take 3 mLs by nebulization 3 (three) times daily as needed.   Yes Historical Provider, MD  isosorbide mononitrate (IMDUR) 30 MG 24 hr tablet Take 45 mg by mouth daily.   Yes Historical Provider, MD  latanoprost (XALATAN) 0.005 % ophthalmic solution Place 1 drop into both eyes at bedtime. Reported on 01/17/2016   Yes Historical Provider, MD  levothyroxine (SYNTHROID, LEVOTHROID) 200 MCG tablet Take 200 mcg by mouth daily before breakfast.   Yes Historical Provider, MD  LORazepam (ATIVAN) 1 MG tablet Take 1 mg by mouth every 6 (six) hours as needed for anxiety.   Yes Historical Provider, MD  metoprolol tartrate (LOPRESSOR) 25 MG tablet Take 1 tablet (25 mg total) by mouth 2 (two) times daily. 05/16/15  Yes Arnoldo Lenis, MD  Multiple Vitamin (MULTIVITAMIN WITH MINERALS) TABS tablet Take 1 tablet by mouth daily.   Yes Historical Provider, MD  nitroGLYCERIN (NITROSTAT) 0.4 MG SL tablet Place 0.4 mg under the tongue every 5 (five) minutes as needed for chest pain.   Yes Historical Provider, MD  nortriptyline (PAMELOR) 50 MG capsule Take 50 mg by mouth at bedtime.   Yes Historical Provider, MD  omeprazole (PRILOSEC) 20 MG capsule Take 20 mg by mouth daily.   Yes Historical Provider, MD  ondansetron (ZOFRAN) 4 MG tablet Take 4 mg by mouth every 8 (eight) hours as needed for nausea or vomiting.   Yes Historical Provider, MD  oxyCODONE-acetaminophen (PERCOCET/ROXICET) 5-325 MG tablet Take 1 tablet by mouth every 6 (six) hours as needed for moderate pain or severe pain.   Yes Historical Provider, MD  phenytoin (DILANTIN) 200 MG ER capsule Take 200 mg by mouth 3 (three) times daily.   Yes Historical Provider, MD  pravastatin (PRAVACHOL) 80 MG tablet Take 80 mg by mouth daily.   Yes Historical Provider, MD  pregabalin (LYRICA) 75 MG capsule Take 75 mg by mouth daily.   Yes Historical  Provider, MD  Propylene Glycol (SYSTANE BALANCE) 0.6 % SOLN Place 1 drop into both eyes 2 (two) times daily. Both eyes   Yes Historical Provider, MD  senna (SENOKOT) 8.6 MG TABS tablet Take 1 tablet by mouth 2 (two) times daily.   Yes Historical Provider, MD  tizanidine (ZANAFLEX) 2 MG capsule Take 2 mg by mouth every 6 (six) hours as needed for muscle spasms.   Yes Historical Provider, MD  vitamin C (ASCORBIC ACID) 500 MG tablet Take 500 mg by mouth 2 (two) times daily.   Yes Historical Provider, MD  warfarin (COUMADIN) 2 MG tablet Take 7 mg by mouth every Monday, Wednesday, and Friday.   Yes Historical Provider, MD  warfarin (COUMADIN) 6 MG tablet Take 6 mg by mouth See admin instructions. Take on Sun, Tue, Thur, and Sat.   Yes Historical Provider, MD     Allergies:  Allergies  Allergen Reactions  . Latex Other (See Comments)    unknown    Social History:   reports that she quit smoking about 12 years ago. Her smoking use included Cigarettes. She smoked 0.50 packs per day. She has never used smokeless tobacco. She  reports that she does not drink alcohol. Her drug history is not on file.  Family History: Family History  Problem Relation Age of Onset  . Hypertension Other      Physical Exam: Filed Vitals:   03/24/16 2133 03/25/16 0635 03/25/16 0650 03/25/16 0842  BP: 113/54  132/74   Pulse: 98  92   Temp: 100 F (37.8 C)  98.9 F (37.2 C)   TempSrc: Oral  Oral   Resp: 20  20   Height:      Weight:  122.1 kg (269 lb 2.9 oz)    SpO2: 94%  95% 91%   Blood pressure 132/74, pulse 92, temperature 98.9 F (37.2 C), temperature source Oral, resp. rate 20, height 5\' 6"  (1.676 m), weight 122.1 kg (269 lb 2.9 oz), SpO2 91 %.  GEN:  Pleasant  patient lying in the stretcher in no acute distress; cooperative with exam. PSYCH:  alert and oriented x4; does not appear anxious or depressed; affect is appropriate. HEENT: Mucous membranes pink and anicteric; PERRLA; EOM intact; no cervical  lymphadenopathy nor thyromegaly or carotid bruit; no JVD; There were no stridor. Neck is very supple. Breasts:: Not examined CHEST WALL: No tenderness CHEST: Normal respiration, clear to auscultation bilaterally.  HEART: Regular rate and rhythm.  There are no murmur, rub, or gallops.   BACK: No kyphosis or scoliosis; no CVA tenderness ABDOMEN: soft and non-tender; no masses, no organomegaly, normal abdominal bowel sounds; no pannus; no intertriginous candida. There is no rebound and no distention. Rectal Exam: Not done EXTREMITIES: No bone or joint deformity; age-appropriate arthropathy of the hands and knees; no edema; no ulcerations.  There is no calf tenderness. Genitalia: not examined PULSES: 2+ and symmetric SKIN: Normal hydration no rash or ulceration CNS: Cranial nerves 2-12 grossly intact    Labs on Admission:  Basic Metabolic Panel:  Recent Labs Lab 03/23/16 1432 03/24/16 0534  NA 138 139  K 4.1 3.6  CL 101 106  CO2 30 26  GLUCOSE 119* 96  BUN 9 9  CREATININE 0.68 0.76  CALCIUM 8.7* 8.6*   Liver Function Tests:  Recent Labs Lab 03/23/16 1432 03/24/16 0534  AST 23 16  ALT 27 20  ALKPHOS 158* 127*  BILITOT 0.6 0.7  PROT 7.2 6.4*  ALBUMIN 3.4* 3.0*   CBC:  Recent Labs Lab 03/23/16 1432 03/24/16 0534  WBC 17.5* 13.2*  NEUTROABS 14.2* 8.7*  HGB 15.3* 13.7  HCT 46.5* 41.7  MCV 96.3 97.7  PLT 178 156   Cardiac Enzymes:  Recent Labs Lab 03/23/16 2029  TROPONINI 0.03   Radiological Exams on Admission: Dg Chest 1 View  03/23/2016  CLINICAL DATA:  Fever. EXAM: CHEST 1 VIEW COMPARISON:  September 19, 2015 x-ray FINDINGS: Stable cardiomegaly. No pneumothorax. No pulmonary nodules, masses, or infiltrates. No overt edema. IMPRESSION: No active disease. Electronically Signed   By: Dorise Bullion III M.D   On: 03/23/2016 14:59   Assessment/Plan Present on Admission:  . Sepsis ([place]) . UTI (lower urinary tract infection) . HTN (hypertension) . COPD  (chronic obstructive pulmonary disease) (HCC)  PLAN:  Sepsis due to UTI, given multiple drug resistant E Coli with bacteremia, will need to continue with IV Zosyn.  Note E Coli is resistant to Cipro, Ampicillin, Nitrofurantoin and Bactrim.   COPD Stable.  HTN:  Controlled.   Other plans as per orders. Code Status: FULL Haskel Khan, MD.  FACP [facility] Pager [phone] 7pm to 7am.  03/25/2016, 10:40  AM

## 2016-03-25 NOTE — Telephone Encounter (Signed)
Biopsy done on 01-18-16. All follow up appointments were cancelled. She is in [place] [facility] [place].

## 2016-03-25 NOTE — Progress Notes (Signed)
Pharmacy Antibiotic Note  Karen Mccann is a 65 y.o. female admitted on 03/23/2016 with bacteremia/UTI ESBL.  Pharmacy has been consulted for Primaxin dosing. Urine cx + E.Coli with ESBL, blood cx + E.Coli but no sensitivities.  Discussed with Dr. Marin Comment and will change to primaxin. Follow up on blood cultures and sensitivities. Deescalate as treatment warranted  Plan: Primaxin 500mg  IV every 6 hours  Height: 5\' 6"  (167.6 cm) Weight: 269 lb 2.9 oz (122.1 kg) IBW/kg (Calculated) : 59.3  Temp (24hrs), Avg:99 F (37.2 C), Min:98 F (36.7 C), Max:100 F (37.8 C)   Recent Labs Lab 03/23/16 1432 03/23/16 1455 03/23/16 2029 03/24/16 0130 03/24/16 0534  WBC 17.5*  --   --   --  13.2*  CREATININE 0.68  --   --   --  0.76  LATICACIDVEN  --  3.00* 2.2* 2.3*  --     Estimated Creatinine Clearance: 95.9 mL/min (by C-G formula based on Cr of 0.76).    Allergies  Allergen Reactions  . Latex Other (See Comments)    unknown    Antimicrobials this admission: Vancomycin  >> 6/10 Zosyn  >> 6/10>>6/12  Dose adjustments this admission: None  Cultures: 6/10 Blood cx: 1 of 2 is + E.Coli, awaiting sensitivies 6/10 Urine cx + E. Coli: ESBL  Thank you for allowing pharmacy to be a part of this patient's care.  Isac Sarna, BS Pharm D, [place] Clinical Pharmacist Pager [phone] 03/25/2016 1:51 PM

## 2016-03-25 NOTE — Care Management Note (Signed)
Case Management Note  Patient Details  Name: Karen Mccann MRN: [id number] Date of Birth: November 21, 1950  Subjective/Objective:                  Pt admitted with Sepsis. Pt is from [facility]. Anticipate pt will return to [facility] at [place] is aware and will make arrangements for return to facility.   Action/Plan: No CM needs anticipated.   Expected Discharge Date:    03/27/2016              Expected Discharge Plan:  Skilled Nursing Facility  In-House Referral:  Clinical Social Work  Discharge planning Services  CM Consult  Post Acute Care Choice:  NA Choice offered to:  NA  DME Arranged:    DME Agency:     HH Arranged:    [place] Agency:     Status of Service:  Completed, signed off  Medicare Important Message Given:  Yes Date Medicare IM Given:    Medicare IM give by:    Date Additional Medicare IM Given:    Additional Medicare Important Message give by:     If discussed at [place] of Stay Meetings, dates discussed:    Additional Comments:  Sherald Barge, RN 03/25/2016, 11:28 AM

## 2016-03-25 NOTE — Care Management Important Message (Signed)
Important Message  Patient Details  Name: Karen Mccann MRN: [id number] Date of Birth: [birth year]   Medicare Important Message Given:  Yes    Sherald Barge, RN 03/25/2016, 11:28 AM

## 2016-03-26 LAB — CULTURE, BLOOD (ROUTINE X 2)

## 2016-03-26 LAB — COMPREHENSIVE METABOLIC PANEL
ALBUMIN: 2.4 g/dL — AB (ref 3.5–5.0)
ALK PHOS: 91 U/L (ref 38–126)
ALT: 13 U/L — AB (ref 14–54)
AST: 12 U/L — ABNORMAL LOW (ref 15–41)
Anion gap: 6 (ref 5–15)
BUN: 9 mg/dL (ref 6–20)
CALCIUM: 8.1 mg/dL — AB (ref 8.9–10.3)
CO2: 26 mmol/L (ref 22–32)
CREATININE: 0.53 mg/dL (ref 0.44–1.00)
Chloride: 108 mmol/L (ref 101–111)
GFR calc Af Amer: >60 mL/min (ref >60)
GFR calc non Af Amer: >60 mL/min (ref >60)
GLUCOSE: 75 mg/dL (ref 65–99)
Potassium: 3.4 mmol/L — ABNORMAL LOW (ref 3.5–5.1)
SODIUM: 140 mmol/L (ref 135–145)
Total Bilirubin: 0.3 mg/dL (ref 0.3–1.2)
Total Protein: 5.4 g/dL — ABNORMAL LOW (ref 6.5–8.1)

## 2016-03-26 LAB — PROTIME-INR
INR: 1.94 — AB (ref 0.00–1.49)
PROTHROMBIN TIME: 22.1 s — AB (ref 11.6–15.2)

## 2016-03-26 LAB — PHENYTOIN LEVEL, TOTAL: Phenytoin Lvl: 17.2 ug/mL (ref 10.0–20.0)

## 2016-03-26 MED ORDER — WARFARIN - PHARMACIST DOSING INPATIENT
Freq: Every day | Status: DC
  Administered 2016-03-26: 1

## 2016-03-26 MED ORDER — HEPARIN (PORCINE) IN NACL 100-0.45 UNIT/ML-% IJ SOLN
1150.0000 [IU]/h | INTRAMUSCULAR | Status: DC
  Filled 2016-03-26: qty 250

## 2016-03-26 MED ORDER — WARFARIN SODIUM 1 MG PO TABS
6.0000 mg | ORAL_TABLET | Freq: Once | ORAL | Status: AC
  Administered 2016-03-26: 6 mg via ORAL
  Filled 2016-03-26: qty 1

## 2016-03-26 NOTE — Clinical Social Work Note (Signed)
Clinical Social Work Assessment  Patient Details  Name: Karen Mccann MRN: [id number] Date of Birth: 11-02-1950  Date of referral:  03/25/16               Reason for consult:  Facility Placement                Permission sought to share information with:    Permission granted to share information::     Name::        Agency::     Relationship::     Contact Information:     Housing/Transportation Living arrangements for the past 2 months:  [place] of Information:  Facility Patient Interpreter Needed:  None Criminal Activity/Legal Involvement Pertinent to Current Situation/Hospitalization:  No - Comment as needed Significant Relationships:  Adult Children Lives with:  Facility Resident Do you feel safe going back to the place where you live?  Yes Need for family participation in patient care:  Yes (Comment)  Care giving concerns:  Facility placement   Social Worker assessment / plan: CSW spoke with Debbie at [facility] who advised that patient has been a resident for the past 10 years.  She stated that patient is alert and oriented, uses a wheelchair, self feeds and is able to wash up in the sink.  She stated that Staff does assist patient with ADLs.  Jackelyn Poling stated that patient's daughter is supportive and that she can return the the facility at discharge.   Employment status:  Disabled (Comment on whether or not currently receiving Disability) Insurance information:  Medicare PT Recommendations:  Not assessed at this time Information / Referral to community resources:     Patient/Family's Response to care:  Patient is a 10 year resident at the facility. Patient and daughter are agreeable to patient's return to facility.   Patient/Family's Understanding of and Emotional Response to Diagnosis, Current Treatment, and Prognosis:  Patient's daughter understands diagnosis, treatment and prognosis.   Emotional Assessment Appearance:  Appears stated  age Attitude/Demeanor/Rapport:  Unable to Assess Affect (typically observed):  Unable to Assess Orientation:  Oriented to Self, Oriented to Place, Oriented to  Time, Oriented to Situation Alcohol / Substance use:  Not Applicable Psych involvement (Current and /or in the community):     Discharge Needs  Concerns to be addressed:  Discharge Planning Concerns (Return to [facility]) Readmission within the last 30 days:  No Current discharge risk:  None Barriers to Discharge:  No Barriers Identified   Ihor Gully, LCSW 03/26/2016, 2:15 PM

## 2016-03-26 NOTE — Progress Notes (Addendum)
ANTICOAGULATION CONSULT NOTE -Follow up  Pharmacy Consult for Heparin Indication: stroke  Allergies  Allergen Reactions  . Latex Other (See Comments)    unknown   Patient Measurements: Height: 5\' 6"  (167.6 cm) Weight: 271 lb 9.7 oz (123.2 kg) IBW/kg (Calculated) : 59.3 HEPARIN DW (KG): 87.9  Vital Signs: Temp: 98.3 F (36.8 C) (06/13 0541) Temp Source: Oral (06/13 0541) BP: 95/58 mmHg (06/13 0541) Pulse Rate: 72 (06/13 0541)  Labs:  Recent Labs  03/23/16 1432 03/23/16 2029 03/24/16 0534 03/25/16 0500 03/26/16 0423  HGB 15.3*  --  13.7  --   --   HCT 46.5*  --  41.7  --   --   PLT 178  --  156  --   --   APTT  --  48*  --   --   --   LABPROT 28.8*  --  28.3* 26.4* 22.1*  INR 2.77*  --  2.71* 2.46* 1.94*  CREATININE 0.68  --  0.76  --  0.53  TROPONINI  --  0.03  --   --   --     Estimated Creatinine Clearance: 96.5 mL/min (by C-G formula based on Cr of 0.53).   Medical History: Past Medical History  Diagnosis Date  . COPD (chronic obstructive pulmonary disease) ([place])   . Heart failure ([place])   . Hemiplegia ([place])   . Hemiparesis ([place])   . Thyroid disease   . Depression   . Bipolar 1 disorder, depressed ([place])   . Gastroesophageal reflux   . Stroke [facility])     Medications:  Scheduled:  . aspirin EC  81 mg Oral Daily  . calcium-vitamin D  1 tablet Oral Q breakfast  . cholecalciferol  1,000 Units Oral Daily  . DULoxetine  60 mg Oral QHS  . ferrous sulfate  325 mg Oral BID WC  . fluticasone  1 spray Each Nare QHS  . guaiFENesin  600 mg Oral BID  . hydrOXYzine  50 mg Oral QHS  . imipenem-cilastatin  500 mg Intravenous Q6H  . isosorbide mononitrate  45 mg Oral Daily  . latanoprost  1 drop Both Eyes QHS  . levothyroxine  200 mcg Oral QAC breakfast  . loratadine  10 mg Oral Daily  . metoprolol tartrate  25 mg Oral BID  . multivitamin with minerals  1 tablet Oral Daily  . nortriptyline  50 mg Oral QHS  . pantoprazole  40 mg Oral Daily  . phenytoin  200 mg  Oral Q8H  . pravastatin  80 mg Oral q1800  . pregabalin  75 mg Oral Daily  . senna  1 tablet Oral BID  . vitamin C  500 mg Oral BID    Assessment: 65 yo female, history of stroke with left-sided hemiplegia. PTA Coumadin INR <2.0 today so will start heparin drip with no bolus  Goal of Therapy:  Heparin level 0.3-0.5 units/ml Monitor platelets by anticoagulation protocol: Yes   Plan:  Heparin drip at 1150 units/hr Heparin level in 8 hours after start. Daily HL and CBC while on heparin Monitor for bleeding complications Start heparin when INR < 2  Rochel Privett [place] 03/26/2016,7:38 AM  Addum:  Restart coumadin.  Will give 6 mg today.  Check daily PT/INR Excell Seltzer, PharmD

## 2016-03-26 NOTE — Progress Notes (Signed)
[facility] PROGRESS NOTE  [name] [id number] DOB: 1951/02/21    PCP:   [name], MD   HPI: Karen Mccann is a 65 y.o. female with medical history significant of stroke with left-sided hemiplegia, CAD status post CABG, hypothyroidism, hyperlipidemia, seizures presents to the ER because of increasing abdominal pain and nausea vomiting over the last 2 days. She was subsequently admitted for sepsis due to UTI, and was started on IV Zosyn. She was feeling better. Her Blood culture grew E Coli, with ESBL, and Zosyn was changed to Primaxin, though it was sensitive invitro to Zosyn.  She has no other complaints. Noted urine E Coli was resistant to ampicillin, Rocephin, Cipro, and Bactrim, but is sensitive to Zosyn and Unasyn. She had apparently thyroid biopsy, result pending. She continued to improve.   Rewiew of Systems:  Constitutional: Negative for malaise, fever and chills. No significant weight loss or weight gain Eyes: Negative for eye pain, redness and discharge, diplopia, visual changes, or flashes of light. ENMT: Negative for ear pain, hoarseness, nasal congestion, sinus pressure and sore throat. No headaches; tinnitus, drooling, or problem swallowing. Cardiovascular: Negative for chest pain, palpitations, diaphoresis, dyspnea and peripheral edema. ; No orthopnea, PND Respiratory: Negative for cough, hemoptysis, wheezing and stridor. No pleuritic chestpain. Gastrointestinal: Negative for nausea, vomiting, diarrhea, constipation, abdominal pain, melena, blood in stool, hematemesis, jaundice and rectal bleeding.    Genitourinary: Negative for frequency, dysuria, incontinence,flank pain and hematuria; Musculoskeletal: Negative for back pain and neck pain. Negative for swelling and trauma.;  Skin: . Negative for pruritus, rash, abrasions, bruising and skin lesion.; ulcerations Neuro: Negative for headache, lightheadedness and neck stiffness. Negative for weakness,  altered level of consciousness , altered mental status, extremity weakness, burning feet, involuntary movement, seizure and syncope.  Psych: negative for anxiety, depression, insomnia, tearfulness, panic attacks, hallucinations, paranoia, suicidal or homicidal ideation    Past Medical History  Diagnosis Date  . COPD (chronic obstructive pulmonary disease) ([place])   . Heart failure ([place])   . Hemiplegia ([place])   . Hemiparesis ([place])   . Thyroid disease   . Depression   . Bipolar 1 disorder, depressed ([place])   . Gastroesophageal reflux   . Stroke [facility])     History reviewed. No pertinent past surgical history.  Medications:  HOME MEDS: Prior to Admission medications   Medication Sig Start Date End Date Taking? Authorizing Provider  acetaminophen (TYLENOL) 325 MG tablet Take 650 mg by mouth every 4 (four) hours as needed for mild pain. Reported on 01/17/2016   Yes Historical Provider, MD  acyclovir (ZOVIRAX) 400 MG tablet Take 400 mg by mouth 2 (two) times daily.   Yes Historical Provider, MD  aspirin EC 81 MG tablet Take 81 mg by mouth daily.   Yes Historical Provider, MD  calcium-vitamin D (OSCAL WITH D) 500-200 MG-UNIT per tablet Take 1 tablet by mouth daily with breakfast.   Yes Historical Provider, MD  cetirizine (ZYRTEC) 10 MG tablet Take 10 mg by mouth daily.   Yes Historical Provider, MD  cholecalciferol (VITAMIN D) 1000 units tablet Take 1,000 Units by mouth daily.   Yes Historical Provider, MD  DULoxetine (CYMBALTA) 60 MG capsule Take 60 mg by mouth at bedtime.    Yes Historical Provider, MD  ferrous sulfate 325 (65 FE) MG tablet Take 325 mg by mouth 2 (two) times daily with a meal.   Yes Historical Provider, MD  fluticasone (FLONASE) 50 MCG/ACT nasal spray Place 1 spray into both  nostrils at bedtime.   Yes Historical Provider, MD  furosemide (LASIX) 20 MG tablet Take 20 mg by mouth daily.    Yes Historical Provider, MD  guaiFENesin (MUCINEX) 600 MG 12 hr tablet Take 600 mg by mouth 2  (two) times daily.   Yes Historical Provider, MD  hydrOXYzine (ATARAX/VISTARIL) 50 MG tablet Take 50 mg by mouth at bedtime.    Yes Historical Provider, MD  ipratropium-albuterol (DUONEB) 0.5-2.5 (3) MG/3ML SOLN Take 3 mLs by nebulization 3 (three) times daily as needed.   Yes Historical Provider, MD  isosorbide mononitrate (IMDUR) 30 MG 24 hr tablet Take 45 mg by mouth daily.   Yes Historical Provider, MD  latanoprost (XALATAN) 0.005 % ophthalmic solution Place 1 drop into both eyes at bedtime. Reported on 01/17/2016   Yes Historical Provider, MD  levothyroxine (SYNTHROID, LEVOTHROID) 200 MCG tablet Take 200 mcg by mouth daily before breakfast.   Yes Historical Provider, MD  LORazepam (ATIVAN) 1 MG tablet Take 1 mg by mouth every 6 (six) hours as needed for anxiety.   Yes Historical Provider, MD  metoprolol tartrate (LOPRESSOR) 25 MG tablet Take 1 tablet (25 mg total) by mouth 2 (two) times daily. 05/16/15  Yes Arnoldo Lenis, MD  Multiple Vitamin (MULTIVITAMIN WITH MINERALS) TABS tablet Take 1 tablet by mouth daily.   Yes Historical Provider, MD  nitroGLYCERIN (NITROSTAT) 0.4 MG SL tablet Place 0.4 mg under the tongue every 5 (five) minutes as needed for chest pain.   Yes Historical Provider, MD  nortriptyline (PAMELOR) 50 MG capsule Take 50 mg by mouth at bedtime.   Yes Historical Provider, MD  omeprazole (PRILOSEC) 20 MG capsule Take 20 mg by mouth daily.   Yes Historical Provider, MD  ondansetron (ZOFRAN) 4 MG tablet Take 4 mg by mouth every 8 (eight) hours as needed for nausea or vomiting.   Yes Historical Provider, MD  oxyCODONE-acetaminophen (PERCOCET/ROXICET) 5-325 MG tablet Take 1 tablet by mouth every 6 (six) hours as needed for moderate pain or severe pain.   Yes Historical Provider, MD  phenytoin (DILANTIN) 200 MG ER capsule Take 200 mg by mouth 3 (three) times daily.   Yes Historical Provider, MD  pravastatin (PRAVACHOL) 80 MG tablet Take 80 mg by mouth daily.   Yes Historical Provider,  MD  pregabalin (LYRICA) 75 MG capsule Take 75 mg by mouth daily.   Yes Historical Provider, MD  Propylene Glycol (SYSTANE BALANCE) 0.6 % SOLN Place 1 drop into both eyes 2 (two) times daily. Both eyes   Yes Historical Provider, MD  senna (SENOKOT) 8.6 MG TABS tablet Take 1 tablet by mouth 2 (two) times daily.   Yes Historical Provider, MD  tizanidine (ZANAFLEX) 2 MG capsule Take 2 mg by mouth every 6 (six) hours as needed for muscle spasms.   Yes Historical Provider, MD  vitamin C (ASCORBIC ACID) 500 MG tablet Take 500 mg by mouth 2 (two) times daily.   Yes Historical Provider, MD  warfarin (COUMADIN) 2 MG tablet Take 7 mg by mouth every Monday, Wednesday, and Friday.   Yes Historical Provider, MD  warfarin (COUMADIN) 6 MG tablet Take 6 mg by mouth See admin instructions. Take on Sun, Tue, Thur, and Sat.   Yes Historical Provider, MD     Allergies:  Allergies  Allergen Reactions  . Latex Other (See Comments)    unknown    Social History:   reports that she quit smoking about 12 years ago. Her smoking use included Cigarettes.  She smoked 0.50 packs per day. She has never used smokeless tobacco. She reports that she does not drink alcohol. Her drug history is not on file.  Family History: Family History  Problem Relation Age of Onset  . Hypertension Other      Physical Exam: Filed Vitals:   03/26/16 0152 03/26/16 0500 03/26/16 0541 03/26/16 0848  BP: 96/45  95/58   Pulse: 68  72   Temp: 98.1 F (36.7 C)  98.3 F (36.8 C)   TempSrc: Oral  Oral   Resp: 16  16   Height:      Weight:  123.2 kg (271 lb 9.7 oz)    SpO2: 100%  100% 97%   Blood pressure 95/58, pulse 72, temperature 98.3 F (36.8 C), temperature source Oral, resp. rate 16, height 5\' 6"  (1.676 m), weight 123.2 kg (271 lb 9.7 oz), SpO2 97 %.  GEN:  Pleasant  patient lying in the stretcher in no acute distress; cooperative with exam. PSYCH:  alert and oriented x4; does not appear anxious or depressed; affect is  appropriate. HEENT: Mucous membranes pink and anicteric; PERRLA; EOM intact; no cervical lymphadenopathy nor thyromegaly or carotid bruit; no JVD; There were no stridor. Neck is very supple. Breasts:: Not examined CHEST WALL: No tenderness CHEST: Normal respiration, clear to auscultation bilaterally.  HEART: Regular rate and rhythm.  There are no murmur, rub, or gallops.   BACK: No kyphosis or scoliosis; no CVA tenderness ABDOMEN: soft and non-tender; no masses, no organomegaly, normal abdominal bowel sounds; no pannus; no intertriginous candida. There is no rebound and no distention. Rectal Exam: Not done EXTREMITIES: No bone or joint deformity; age-appropriate arthropathy of the hands and knees; no edema; no ulcerations.  There is no calf tenderness. Genitalia: not examined PULSES: 2+ and symmetric SKIN: Normal hydration no rash or ulceration CNS: Cranial nerves 2-12 grossly intact no focal lateralizing neurologic deficit.  Speech is fluent; uvula elevated with phonation, facial symmetry and tongue midline. DTR are normal bilaterally, cerebella exam is intact, barbinski is negative and strengths are equaled bilaterally.  No sensory loss.   Labs on Admission:  Basic Metabolic Panel:  Recent Labs Lab 03/23/16 1432 03/24/16 0534 03/26/16 0423  NA 138 139 140  K 4.1 3.6 3.4*  CL 101 106 108  CO2 30 26 26   GLUCOSE 119* 96 75  BUN 9 9 9   CREATININE 0.68 0.76 0.53  CALCIUM 8.7* 8.6* 8.1*   Liver Function Tests:  Recent Labs Lab 03/23/16 1432 03/24/16 0534 03/26/16 0423  AST 23 16 12*  ALT 27 20 13*  ALKPHOS 158* 127* 91  BILITOT 0.6 0.7 0.3  PROT 7.2 6.4* 5.4*  ALBUMIN 3.4* 3.0* 2.4*   CBC:  Recent Labs Lab 03/23/16 1432 03/24/16 0534  WBC 17.5* 13.2*  NEUTROABS 14.2* 8.7*  HGB 15.3* 13.7  HCT 46.5* 41.7  MCV 96.3 97.7  PLT 178 156   Cardiac Enzymes:  Recent Labs Lab 03/23/16 2029  TROPONINI 0.03   Assessment/Plan Present on Admission:  . Sepsis ([place]) .  UTI (lower urinary tract infection) . HTN (hypertension) . COPD (chronic obstructive pulmonary disease) (HCC)  PLAN:  Sepsis with bacteremia:  E Coli with ESBL.  Continue with IV Primaxin.  Oral antibiotics are limited due to resistant.   HTN  Stable.  COPD:  Stable.   Other plans as per orders. Code Status: FULL Haskel Khan, MD.  FACP [facility] Pager [phone] 7pm to 7am.  03/26/2016, 1:56 PM

## 2016-03-27 DIAGNOSIS — R41841 Cognitive communication deficit: Secondary | ICD-10-CM | POA: Diagnosis not present

## 2016-03-27 DIAGNOSIS — F319 Bipolar disorder, unspecified: Secondary | ICD-10-CM | POA: Diagnosis not present

## 2016-03-27 DIAGNOSIS — Z8679 Personal history of other diseases of the circulatory system: Secondary | ICD-10-CM | POA: Diagnosis not present

## 2016-03-27 DIAGNOSIS — F418 Other specified anxiety disorders: Secondary | ICD-10-CM | POA: Diagnosis not present

## 2016-03-27 DIAGNOSIS — N390 Urinary tract infection, site not specified: Secondary | ICD-10-CM

## 2016-03-27 DIAGNOSIS — F339 Major depressive disorder, recurrent, unspecified: Secondary | ICD-10-CM | POA: Diagnosis not present

## 2016-03-27 DIAGNOSIS — F800 Phonological disorder: Secondary | ICD-10-CM | POA: Diagnosis not present

## 2016-03-27 DIAGNOSIS — I4891 Unspecified atrial fibrillation: Secondary | ICD-10-CM | POA: Diagnosis not present

## 2016-03-27 DIAGNOSIS — I69954 Hemiplegia and hemiparesis following unspecified cerebrovascular disease affecting left non-dominant side: Secondary | ICD-10-CM | POA: Diagnosis not present

## 2016-03-27 DIAGNOSIS — R279 Unspecified lack of coordination: Secondary | ICD-10-CM | POA: Diagnosis not present

## 2016-03-27 DIAGNOSIS — M6281 Muscle weakness (generalized): Secondary | ICD-10-CM | POA: Diagnosis not present

## 2016-03-27 DIAGNOSIS — E559 Vitamin D deficiency, unspecified: Secondary | ICD-10-CM | POA: Diagnosis not present

## 2016-03-27 DIAGNOSIS — K219 Gastro-esophageal reflux disease without esophagitis: Secondary | ICD-10-CM | POA: Diagnosis not present

## 2016-03-27 DIAGNOSIS — E8809 Other disorders of plasma-protein metabolism, not elsewhere classified: Secondary | ICD-10-CM | POA: Diagnosis not present

## 2016-03-27 DIAGNOSIS — Z8673 Personal history of transient ischemic attack (TIA), and cerebral infarction without residual deficits: Secondary | ICD-10-CM | POA: Diagnosis not present

## 2016-03-27 DIAGNOSIS — B009 Herpesviral infection, unspecified: Secondary | ICD-10-CM | POA: Diagnosis not present

## 2016-03-27 DIAGNOSIS — F419 Anxiety disorder, unspecified: Secondary | ICD-10-CM | POA: Diagnosis not present

## 2016-03-27 DIAGNOSIS — R293 Abnormal posture: Secondary | ICD-10-CM | POA: Diagnosis not present

## 2016-03-27 DIAGNOSIS — F332 Major depressive disorder, recurrent severe without psychotic features: Secondary | ICD-10-CM | POA: Diagnosis not present

## 2016-03-27 DIAGNOSIS — F411 Generalized anxiety disorder: Secondary | ICD-10-CM | POA: Diagnosis not present

## 2016-03-27 DIAGNOSIS — I2510 Atherosclerotic heart disease of native coronary artery without angina pectoris: Secondary | ICD-10-CM | POA: Diagnosis not present

## 2016-03-27 DIAGNOSIS — G8220 Paraplegia, unspecified: Secondary | ICD-10-CM | POA: Diagnosis not present

## 2016-03-27 DIAGNOSIS — E785 Hyperlipidemia, unspecified: Secondary | ICD-10-CM | POA: Diagnosis not present

## 2016-03-27 DIAGNOSIS — R278 Other lack of coordination: Secondary | ICD-10-CM | POA: Diagnosis not present

## 2016-03-27 DIAGNOSIS — A4151 Sepsis due to Escherichia coli [E. coli]: Secondary | ICD-10-CM | POA: Diagnosis not present

## 2016-03-27 DIAGNOSIS — T7840XA Allergy, unspecified, initial encounter: Secondary | ICD-10-CM | POA: Diagnosis not present

## 2016-03-27 DIAGNOSIS — G8929 Other chronic pain: Secondary | ICD-10-CM | POA: Diagnosis not present

## 2016-03-27 DIAGNOSIS — Z7401 Bed confinement status: Secondary | ICD-10-CM | POA: Diagnosis not present

## 2016-03-27 DIAGNOSIS — D689 Coagulation defect, unspecified: Secondary | ICD-10-CM | POA: Diagnosis not present

## 2016-03-27 DIAGNOSIS — R2681 Unsteadiness on feet: Secondary | ICD-10-CM | POA: Diagnosis not present

## 2016-03-27 DIAGNOSIS — G40909 Epilepsy, unspecified, not intractable, without status epilepticus: Secondary | ICD-10-CM | POA: Diagnosis not present

## 2016-03-27 DIAGNOSIS — A419 Sepsis, unspecified organism: Secondary | ICD-10-CM | POA: Diagnosis not present

## 2016-03-27 DIAGNOSIS — Z951 Presence of aortocoronary bypass graft: Secondary | ICD-10-CM | POA: Diagnosis not present

## 2016-03-27 DIAGNOSIS — Z7901 Long term (current) use of anticoagulants: Secondary | ICD-10-CM | POA: Diagnosis not present

## 2016-03-27 LAB — PROTIME-INR
INR: 1.71 — ABNORMAL HIGH (ref 0.00–1.49)
Prothrombin Time: 20 seconds — ABNORMAL HIGH (ref 11.6–15.2)

## 2016-03-27 MED ORDER — SODIUM CHLORIDE 0.9 % IV SOLN: 500.0000 mg | Freq: Four times a day (QID) | INTRAVENOUS | Status: AC

## 2016-03-27 MED ORDER — WARFARIN SODIUM 5 MG PO TABS
6.0000 mg | ORAL_TABLET | Freq: Once | ORAL | Status: AC
  Administered 2016-03-27: 6 mg via ORAL
  Filled 2016-03-27: qty 1

## 2016-03-27 NOTE — Discharge Summary (Signed)
Physician Discharge Summary  Karen Mccann [id number] DOB: 06-09-1951 DOA: 03/23/2016  PCP: [name], MD  Admit date: 03/23/2016 Discharge date: 03/27/2016  Time spent: 45 minutes  Recommendations for Outpatient Follow-up:  -Will be discharged back to SNF today. -To continue Primaxin until 6/24.   Discharge Diagnoses:  Principal Problem:   Sepsis ([place]) Active Problems:   COPD (chronic obstructive pulmonary disease) (HCC)   Hx of CABG   HTN (hypertension)   UTI (lower urinary tract infection)   Discharge Condition: Stable and improved  Filed Weights   03/24/16 0457 03/25/16 0635 03/26/16 0500  Weight: 123.1 kg (271 lb 6.2 oz) 122.1 kg (269 lb 2.9 oz) 123.2 kg (271 lb 9.7 oz)    History of present illness:  As per Dr. Hal Hope on 6/10: Karen Mccann is a 65 y.o. female with medical history significant of stroke with left-sided hemiplegia, CAD status post CABG, hypothyroidism, hyperlipidemia, seizures presents to the ER because of increasing abdominal pain and nausea vomiting over the last 2 days. Patient had at least 2 episodes of nausea vomiting denies any diarrhea. Pain is mostly in the upper outer and on exam was tender. Denies any chest pain shortness of breath or productive cough. Patient also had fever chills last 2 days. Has denied any increasing frequency of urination. In the ER patient was febrile with temperature of 102F lactic acid was around 3 with leukocytosis tachycardic patient was given fluid bolus according to her sepsis protocol and was started on empiric antibiotics for sepsis. UA shows features consistent with UTI. In December urine cultures showed Escherichia coli which was only sensitive to imipenem and gentamicin and Zosyn. Patient has been admitted for sepsis secondary to UTI. CT abdomen and pelvis is pending at this time.   ED Course: Fluid bolus empiric antibiotics were given.  Hospital Course:   Escherichia coli ESBL sepsis due to  UTI -Due to restricted sensitivity, will need to continue IV Primaxin for a total of 14 days, of which 10 are remaining at time of discharge. Antibiotic stop date is officially 04/06/16.  - sepsis parameters have resolved.  HTN -Stable.  COPD -Stable.    Procedures: None   Consultations:  None  Discharge Instructions  Discharge Instructions    Diet - low sodium heart healthy    Complete by:  As directed      Increase activity slowly    Complete by:  As directed             Medication List    STOP taking these medications        acyclovir 400 MG tablet  Commonly known as:  ZOVIRAX      TAKE these medications        acetaminophen 325 MG tablet  Commonly known as:  TYLENOL  Take 650 mg by mouth every 4 (four) hours as needed for mild pain. Reported on 01/17/2016     aspirin EC 81 MG tablet  Take 81 mg by mouth daily.     calcium-vitamin D 500-200 MG-UNIT tablet  Commonly known as:  OSCAL WITH D  Take 1 tablet by mouth daily with breakfast.     cetirizine 10 MG tablet  Commonly known as:  ZYRTEC  Take 10 mg by mouth daily.     cholecalciferol 1000 units tablet  Commonly known as:  VITAMIN D  Take 1,000 Units by mouth daily.     DULoxetine 60 MG capsule  Commonly known as:  CYMBALTA  Take 60 mg by mouth at bedtime.     ferrous sulfate 325 (65 FE) MG tablet  Take 325 mg by mouth 2 (two) times daily with a meal.     fluticasone 50 MCG/ACT nasal spray  Commonly known as:  FLONASE  Place 1 spray into both nostrils at bedtime.     furosemide 20 MG tablet  Commonly known as:  LASIX  Take 20 mg by mouth daily.     guaiFENesin 600 MG 12 hr tablet  Commonly known as:  MUCINEX  Take 600 mg by mouth 2 (two) times daily.     hydrOXYzine 50 MG tablet  Commonly known as:  ATARAX/VISTARIL  Take 50 mg by mouth at bedtime.     imipenem-cilastatin 500 mg in sodium chloride 0.9 % 100 mL  Inject 500 mg into the vein every 6 (six) hours.      ipratropium-albuterol 0.5-2.5 (3) MG/3ML Soln  Commonly known as:  DUONEB  Take 3 mLs by nebulization 3 (three) times daily as needed.     isosorbide mononitrate 30 MG 24 hr tablet  Commonly known as:  IMDUR  Take 45 mg by mouth daily.     latanoprost 0.005 % ophthalmic solution  Commonly known as:  XALATAN  Place 1 drop into both eyes at bedtime. Reported on 01/17/2016     levothyroxine 200 MCG tablet  Commonly known as:  SYNTHROID, LEVOTHROID  Take 200 mcg by mouth daily before breakfast.     LORazepam 1 MG tablet  Commonly known as:  ATIVAN  Take 1 mg by mouth every 6 (six) hours as needed for anxiety.     metoprolol tartrate 25 MG tablet  Commonly known as:  LOPRESSOR  Take 1 tablet (25 mg total) by mouth 2 (two) times daily.     multivitamin with minerals Tabs tablet  Take 1 tablet by mouth daily.     nitroGLYCERIN 0.4 MG SL tablet  Commonly known as:  NITROSTAT  Place 0.4 mg under the tongue every 5 (five) minutes as needed for chest pain.     nortriptyline 50 MG capsule  Commonly known as:  PAMELOR  Take 50 mg by mouth at bedtime.     omeprazole 20 MG capsule  Commonly known as:  PRILOSEC  Take 20 mg by mouth daily.     ondansetron 4 MG tablet  Commonly known as:  ZOFRAN  Take 4 mg by mouth every 8 (eight) hours as needed for nausea or vomiting.     oxyCODONE-acetaminophen 5-325 MG tablet  Commonly known as:  PERCOCET/ROXICET  Take 1 tablet by mouth every 6 (six) hours as needed for moderate pain or severe pain.     phenytoin 200 MG ER capsule  Commonly known as:  DILANTIN  Take 200 mg by mouth 3 (three) times daily.     pravastatin 80 MG tablet  Commonly known as:  PRAVACHOL  Take 80 mg by mouth daily.     pregabalin 75 MG capsule  Commonly known as:  LYRICA  Take 75 mg by mouth daily.     senna 8.6 MG Tabs tablet  Commonly known as:  SENOKOT  Take 1 tablet by mouth 2 (two) times daily.     SYSTANE BALANCE 0.6 % Soln  Generic drug:  Propylene  Glycol  Place 1 drop into both eyes 2 (two) times daily. Both eyes     tizanidine 2 MG capsule  Commonly known as:  ZANAFLEX  Take 2 mg by  mouth every 6 (six) hours as needed for muscle spasms.     vitamin C 500 MG tablet  Commonly known as:  ASCORBIC ACID  Take 500 mg by mouth 2 (two) times daily.     warfarin 2 MG tablet  Commonly known as:  COUMADIN  Take 7 mg by mouth every Monday, Wednesday, and Friday.     warfarin 6 MG tablet  Commonly known as:  COUMADIN  Take 6 mg by mouth See admin instructions. Take on Sun, Tue, Thur, and Sat.       Allergies  Allergen Reactions  . Latex Other (See Comments)    unknown       Follow-up Information    Follow up with [name], MD. Schedule an appointment as soon as possible for a visit in 2 weeks.   Specialty:  Internal Medicine   Contact information:   [address] [place] [address] [phone]        The results of significant diagnostics from this hospitalization (including imaging, microbiology, ancillary and laboratory) are listed below for reference.    Significant Diagnostic Studies: Dg Chest 1 View  03/23/2016  CLINICAL DATA:  Fever. EXAM: CHEST 1 VIEW COMPARISON:  September 19, 2015 x-ray FINDINGS: Stable cardiomegaly. No pneumothorax. No pulmonary nodules, masses, or infiltrates. No overt edema. IMPRESSION: No active disease. Electronically Signed   By: Dorise Bullion III M.D   On: 03/23/2016 14:59    Microbiology: Recent Results (from the past 240 hour(s))  Blood Culture (routine x 2)     Status: Abnormal   Collection Time: 03/23/16  2:28 PM  Result Value Ref Range Status   Specimen Description BLOOD RIGHT ANTECUBITAL  Final   Special Requests BOTTLES DRAWN AEROBIC AND ANAEROBIC [facility] EACH  Final   Culture  Setup Time   Final    GRAM NEGATIVE RODS AEROBIC BOTTLE ONLY Gram Stain Report Called to,Read Back By and Verified With: JOHNSON,B @ [id number] ON 03/24/16 BY WOODIE,J Performed at [place], READ BACK BY AND VERIFIED WITH: B PITTMAN,PHARMD AT 1000 03/24/16 BY L BENFIELD    Culture (A)  Final    ESCHERICHIA COLI Confirmed Extended Spectrum Beta-Lactamase Producer (ESBL) Performed at [facility]    Report Status 03/26/2016 FINAL  Final   Organism ID, Bacteria ESCHERICHIA COLI  Final      Susceptibility   Escherichia coli - MIC*    AMPICILLIN >=32 RESISTANT Resistant     CEFAZOLIN >=64 RESISTANT Resistant     CEFEPIME >=64 RESISTANT Resistant     CEFTAZIDIME 16 RESISTANT Resistant     CEFTRIAXONE >=64 RESISTANT Resistant     CIPROFLOXACIN >=4 RESISTANT Resistant     GENTAMICIN <=1 SENSITIVE Sensitive     IMIPENEM <=0.25 SENSITIVE Sensitive     TRIMETH/SULFA >=320 RESISTANT Resistant     AMPICILLIN/SULBACTAM 16 INTERMEDIATE Intermediate     PIP/TAZO <=4 SENSITIVE Sensitive     * ESCHERICHIA COLI  Blood Culture ID Panel (Reflexed)     Status: Abnormal   Collection Time: 03/23/16  2:28 PM  Result Value Ref Range Status   Enterococcus species NOT DETECTED NOT DETECTED Final   Vancomycin resistance NOT DETECTED NOT DETECTED Final   Listeria monocytogenes NOT DETECTED NOT DETECTED Final   Staphylococcus species NOT DETECTED NOT DETECTED Final   Staphylococcus aureus NOT DETECTED NOT DETECTED Final   Methicillin resistance NOT DETECTED NOT DETECTED Final   Streptococcus species NOT DETECTED NOT  DETECTED Final   Streptococcus agalactiae NOT DETECTED NOT DETECTED Final   Streptococcus pneumoniae NOT DETECTED NOT DETECTED Final   Streptococcus pyogenes NOT DETECTED NOT DETECTED Final   Acinetobacter baumannii NOT DETECTED NOT DETECTED Final   Enterobacteriaceae species DETECTED (A) NOT DETECTED Final    Comment: CRITICAL RESULT CALLED TO, READ BACK BY AND VERIFIED WITH: B PITTMAN,PHARMD AT 1000 03/24/16 BY L BENFIELD    Enterobacter cloacae complex NOT DETECTED NOT DETECTED Final   Escherichia coli DETECTED (A) NOT DETECTED Final     Comment: CRITICAL RESULT CALLED TO, READ BACK BY AND VERIFIED WITH: B PITTMAN,PHARMD AT 1000 03/24/16 BY L BENFIELD    Klebsiella oxytoca NOT DETECTED NOT DETECTED Final   Klebsiella pneumoniae NOT DETECTED NOT DETECTED Final   Proteus species NOT DETECTED NOT DETECTED Final   Serratia marcescens NOT DETECTED NOT DETECTED Final   Carbapenem resistance NOT DETECTED NOT DETECTED Final   Haemophilus influenzae NOT DETECTED NOT DETECTED Final   Neisseria meningitidis NOT DETECTED NOT DETECTED Final   Pseudomonas aeruginosa NOT DETECTED NOT DETECTED Final   Candida albicans NOT DETECTED NOT DETECTED Final   Candida glabrata NOT DETECTED NOT DETECTED Final   Candida krusei NOT DETECTED NOT DETECTED Final   Candida parapsilosis NOT DETECTED NOT DETECTED Final   Candida tropicalis NOT DETECTED NOT DETECTED Final    Comment: Performed at [facility]  Blood Culture (routine x 2)     Status: None (Preliminary result)   Collection Time: 03/23/16  2:30 PM  Result Value Ref Range Status   Specimen Description BLOOD RIGHT ANTECUBITAL DRAWN BY RN  Final   Special Requests BOTTLES DRAWN AEROBIC AND ANAEROBIC 8CC EACH  Final   Culture NO GROWTH 4 DAYS  Final   Report Status PENDING  Incomplete  Urine culture     Status: Abnormal   Collection Time: 03/23/16  3:12 PM  Result Value Ref Range Status   Specimen Description URINE, CATHETERIZED  Final   Special Requests NONE  Final   Culture (A)  Final    >=100,000 COLONIES/mL ESCHERICHIA COLI Confirmed Extended Spectrum Beta-Lactamase Producer (ESBL) Performed at [facility]    Report Status 03/25/2016 FINAL  Final   Organism ID, Bacteria ESCHERICHIA COLI (A)  Final      Susceptibility   Escherichia coli - MIC*    AMPICILLIN >=32 RESISTANT Resistant     CEFAZOLIN >=64 RESISTANT Resistant     CEFTRIAXONE >=64 RESISTANT Resistant     CIPROFLOXACIN >=4 RESISTANT Resistant     GENTAMICIN <=1 SENSITIVE Sensitive     IMIPENEM <=0.25  SENSITIVE Sensitive     NITROFURANTOIN 128 RESISTANT Resistant     TRIMETH/SULFA >=320 RESISTANT Resistant     AMPICILLIN/SULBACTAM 8 SENSITIVE Sensitive     PIP/TAZO <=4 SENSITIVE Sensitive     * >=100,000 COLONIES/mL ESCHERICHIA COLI  MRSA PCR Screening     Status: None   Collection Time: 03/24/16  2:20 AM  Result Value Ref Range Status   MRSA by PCR NEGATIVE NEGATIVE Final    Comment:        The GeneXpert MRSA Assay (FDA approved for NASAL specimens only), is one component of a comprehensive MRSA colonization surveillance program. It is not intended to diagnose MRSA infection nor to guide or monitor treatment for MRSA infections.      Labs: Basic Metabolic Panel:  Recent Labs Lab 03/23/16 1432 03/24/16 0534 03/26/16 0423  NA 138 139 140  K 4.1 3.6 3.4*  CL 101 106 108  CO2 30 26 26   GLUCOSE 119* 96 75  BUN 9 9 9   CREATININE 0.68 0.76 0.53  CALCIUM 8.7* 8.6* 8.1*   Liver Function Tests:  Recent Labs Lab 03/23/16 1432 03/24/16 0534 03/26/16 0423  AST 23 16 12*  ALT 27 20 13*  ALKPHOS 158* 127* 91  BILITOT 0.6 0.7 0.3  PROT 7.2 6.4* 5.4*  ALBUMIN 3.4* 3.0* 2.4*   No results for input(s): LIPASE, AMYLASE in the last 168 hours. No results for input(s): AMMONIA in the last 168 hours. CBC:  Recent Labs Lab 03/23/16 1432 03/24/16 0534  WBC 17.5* 13.2*  NEUTROABS 14.2* 8.7*  HGB 15.3* 13.7  HCT 46.5* 41.7  MCV 96.3 97.7  PLT 178 156   Cardiac Enzymes:  Recent Labs Lab 03/23/16 2029  TROPONINI 0.03   BNP: BNP (last 3 results) No results for input(s): BNP in the last 8760 hours.  ProBNP (last 3 results) No results for input(s): PROBNP in the last 8760 hours.  CBG: No results for input(s): GLUCAP in the last 168 hours.     SignedLelon Frohlich  [facility] Pager: [phone] 03/27/2016, 3:09 PM

## 2016-03-27 NOTE — Clinical Social Work Note (Signed)
Pt d/c today back to [facility]. Pt and facility aware and agreeable. Facility aware of 10 days IV antibiotics with peripheral IV. Pt to transport via [facility]. Pt indicates no need to call family.  Benay Pike, [place]

## 2016-03-27 NOTE — Progress Notes (Signed)
Report called to [place] at [facility].  Pt transported via EMS

## 2016-03-27 NOTE — NC FL2 (Signed)
[place] LEVEL OF CARE SCREENING TOOL     IDENTIFICATION  Patient Name: Karen Mccann Birthdate: 07/31/1951 Sex: female Admission Date (Current Location): 03/23/2016  [place] and [place] Number:  [place] [id number] [place] and Address:  [facility] [address] [place]      Provider Number: [id number]  Attending Physician Name and Address:  Koleen Nimrod Acost*  Relative Name and Phone Number:       Current Level of Care: Hospital Recommended Level of Care: [place] Prior Approval Number:    Date Approved/Denied:   PASRR Number:  ([id number])  Discharge Plan: SNF    Current Diagnoses: Patient Active Problem List   Diagnosis Date Noted  . Sepsis ([place]) 03/23/2016  . 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 UTI (lower urinary tract infection) 09/17/2015  . Stroke ([place]) 05/09/2015  . Hypothyroid 05/09/2015  . Bipolar 1 disorder ([place]) 05/09/2015  . Seizures ([place]) 05/09/2015  . Hyperlipidemia 05/09/2015  . COPD (chronic obstructive pulmonary disease) ([place]) 05/09/2015  . Depression 05/09/2015  . Anxiety 05/09/2015  . General weakness 05/09/2015  . CAD (coronary artery disease) 05/09/2015  . 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 HTN (hypertension) 05/09/2015  . Chronic pain 05/09/2015  . Esophageal reflux disease 05/09/2015  . Other heart disorders in diseases classified elsewhere 05/09/2015  . Paraplegia ([place]) 05/09/2015    Orientation RESPIRATION BLADDER Height & Weight     Self, Time, Situation, Place  Normal Incontinent Weight: 271 lb 9.7 oz (123.2 kg) Height:  5\' 6"  (167.6 cm)  BEHAVIORAL SYMPTOMS/MOOD NEUROLOGICAL BOWEL NUTRITION STATUS      Incontinent Diet (Regular)  AMBULATORY STATUS COMMUNICATION OF NEEDS Skin   Extensive Assist Verbally Normal                       Personal Care Assistance Level of Assistance  Bathing, Feeding, Dressing Bathing Assistance: Maximum  assistance Feeding assistance: Independent Dressing Assistance: Maximum assistance     Functional Limitations Info  Sight, Hearing, Speech Sight Info: Adequate Hearing Info: Adequate Speech Info: Adequate    SPECIAL CARE FACTORS FREQUENCY                       Contractures      Additional Factors Info  Psychotropic, Isolation Precautions Code Status Info: Full Code Allergies Info: Latex Psychotropic Info: Ativan   Isolation Precautions Info: 03/23/16 Urine Culture positive for E. Coli ESBL      Current Medications (03/27/2016):  This is the current hospital active medication list Current Facility-Administered Medications  Medication Dose Route Frequency Provider Last Rate Last Dose  . acetaminophen (TYLENOL) tablet 650 mg  650 mg Oral Q6H PRN Rise Patience, MD   650 mg at 03/26/16 0208   Or  . acetaminophen (TYLENOL) suppository 650 mg  650 mg Rectal Q6H PRN Rise Patience, MD      . aspirin EC tablet 81 mg  81 mg Oral Daily Rise Patience, MD   81 mg at 03/27/16 1308  . calcium-vitamin D (OSCAL WITH D) 500-200 MG-UNIT per tablet 1 tablet  1 tablet Oral Q breakfast Rise Patience, MD   1 tablet at 03/27/16 [phone]  . cholecalciferol (VITAMIN D) tablet 1,000 Units  1,000 Units Oral Daily Rise Patience, MD   1,000 Units at 03/27/16 1311  . DULoxetine (CYMBALTA) DR capsule 60 mg  60 mg Oral QHS Rise Patience, MD  60 mg at 03/26/16 2125  . ferrous sulfate tablet 325 mg  325 mg Oral BID WC Rise Patience, MD   325 mg at 03/27/16 0952  . fluticasone (FLONASE) 50 MCG/ACT nasal spray 1 spray  1 spray Each Nare QHS Rise Patience, MD   1 spray at 03/26/16 2126  . guaiFENesin (MUCINEX) 12 hr tablet 600 mg  600 mg Oral BID Rise Patience, MD   600 mg at 03/27/16 1310  . hydrOXYzine (ATARAX/VISTARIL) tablet 50 mg  50 mg Oral QHS Rise Patience, MD   50 mg at 03/26/16 2124  . imipenem-cilastatin (PRIMAXIN) 500 mg in sodium chloride  0.9 % 100 mL IVPB  500 mg Intravenous Q6H Orvan Falconer, MD   500 mg at 03/27/16 1255  . ipratropium-albuterol (DUONEB) 0.5-2.5 (3) MG/3ML nebulizer solution 3 mL  3 mL Nebulization TID PRN Rise Patience, MD      . isosorbide mononitrate (IMDUR) 24 hr tablet 45 mg  45 mg Oral Daily Rise Patience, MD   45 mg at 03/27/16 0953  . latanoprost (XALATAN) 0.005 % ophthalmic solution 1 drop  1 drop Both Eyes QHS Rise Patience, MD   1 drop at 03/26/16 2200  . levothyroxine (SYNTHROID, LEVOTHROID) tablet 200 mcg  200 mcg Oral QAC breakfast Rise Patience, MD   200 mcg at 03/27/16 0945  . loratadine (CLARITIN) tablet 10 mg  10 mg Oral Daily Rise Patience, MD   10 mg at 03/27/16 0957  . LORazepam (ATIVAN) tablet 1 mg  1 mg Oral Q6H PRN Rise Patience, MD   1 mg at 03/27/16 0513  . metoprolol tartrate (LOPRESSOR) tablet 25 mg  25 mg Oral BID Rise Patience, MD   25 mg at 03/27/16 0946  . multivitamin with minerals tablet 1 tablet  1 tablet Oral Daily Rise Patience, MD   1 tablet at 03/27/16 0953  . nitroGLYCERIN (NITROSTAT) SL tablet 0.4 mg  0.4 mg Sublingual Q5 min PRN Rise Patience, MD      . nortriptyline (PAMELOR) capsule 50 mg  50 mg Oral QHS Rise Patience, MD   50 mg at 03/26/16 2125  . ondansetron (ZOFRAN) tablet 4 mg  4 mg Oral Q6H PRN Rise Patience, MD   4 mg at 03/26/16 1853   Or  . ondansetron (ZOFRAN) injection 4 mg  4 mg Intravenous Q6H PRN Rise Patience, MD   4 mg at 03/27/16 0042  . oxyCODONE-acetaminophen (PERCOCET/ROXICET) 5-325 MG per tablet 1 tablet  1 tablet Oral Q6H PRN Rise Patience, MD   1 tablet at 03/27/16 0042  . pantoprazole (PROTONIX) EC tablet 40 mg  40 mg Oral Daily Rise Patience, MD   40 mg at 03/27/16 0945  . phenytoin (DILANTIN) ER capsule 200 mg  200 mg Oral Q8H Rise Patience, MD   Stopped at 03/26/16 1400  . pravastatin (PRAVACHOL) tablet 80 mg  80 mg Oral q1800 Rise Patience, MD   80 mg at  03/26/16 1853  . pregabalin (LYRICA) capsule 75 mg  75 mg Oral Daily Rise Patience, MD   75 mg at 03/27/16 0945  . senna (SENOKOT) tablet 8.6 mg  1 tablet Oral BID Rise Patience, MD   8.6 mg at 03/27/16 0952  . tiZANidine (ZANAFLEX) tablet 2 mg  2 mg Oral Q6H PRN Rise Patience, MD      . vitamin C (ASCORBIC ACID)  tablet 500 mg  500 mg Oral BID Rise Patience, MD   500 mg at 03/27/16 1310  . Warfarin - Pharmacist Dosing Inpatient   Does not apply q1800 Orvan Falconer, MD   1 each at 03/26/16 1800     Discharge Medications: Please see discharge summary for a list of discharge medications.  Relevant Imaging Results:  Relevant Lab Results:   Additional Information    Micole Delehanty, Clydene Pugh, LCSW

## 2016-03-27 NOTE — Progress Notes (Signed)
ANTICOAGULATION CONSULT NOTE - Follow Up Consult  Pharmacy Consult for coumadin Indication: CVA  Allergies  Allergen Reactions  . Latex Other (See Comments)    unknown    Patient Measurements: Height: 5\' 6"  (167.6 cm) Weight: 271 lb 9.7 oz (123.2 kg) IBW/kg (Calculated) : 59.3   Vital Signs: Temp: 99 F (37.2 C) (06/13 2200) Temp Source: Oral (06/13 2200) BP: 101/47 mmHg (06/13 2200) Pulse Rate: 78 (06/13 2200)  Labs:  Recent Labs  03/25/16 0500 03/26/16 0423 03/27/16 0542  LABPROT 26.4* 22.1* 20.0*  INR 2.46* 1.94* 1.71*  CREATININE  --  0.53  --     Estimated Creatinine Clearance: 96.5 mL/min (by C-G formula based on Cr of 0.53).   Medications:  Prescriptions prior to admission  Medication Sig Dispense Refill Last Dose  . acetaminophen (TYLENOL) 325 MG tablet Take 650 mg by mouth every 4 (four) hours as needed for mild pain. Reported on 01/17/2016   03/23/2016 at 306a  . acyclovir (ZOVIRAX) 400 MG tablet Take 400 mg by mouth 2 (two) times daily.   03/22/2016 at Unknown time  . aspirin EC 81 MG tablet Take 81 mg by mouth daily.   03/22/2016 at Unknown time  . calcium-vitamin D (OSCAL WITH D) 500-200 MG-UNIT per tablet Take 1 tablet by mouth daily with breakfast.   03/22/2016 at Unknown time  . cetirizine (ZYRTEC) 10 MG tablet Take 10 mg by mouth daily.   03/22/2016 at Unknown time  . cholecalciferol (VITAMIN D) 1000 units tablet Take 1,000 Units by mouth daily.   03/22/2016 at Unknown time  . DULoxetine (CYMBALTA) 60 MG capsule Take 60 mg by mouth at bedtime.    03/22/2016 at Unknown time  . ferrous sulfate 325 (65 FE) MG tablet Take 325 mg by mouth 2 (two) times daily with a meal.   03/22/2016 at Unknown time  . fluticasone (FLONASE) 50 MCG/ACT nasal spray Place 1 spray into both nostrils at bedtime.   03/22/2016 at Unknown time  . furosemide (LASIX) 20 MG tablet Take 20 mg by mouth daily.    03/22/2016 at Unknown time  . guaiFENesin (MUCINEX) 600 MG 12 hr tablet Take 600 mg by mouth  2 (two) times daily.   03/22/2016 at Unknown time  . hydrOXYzine (ATARAX/VISTARIL) 50 MG tablet Take 50 mg by mouth at bedtime.    03/22/2016 at Unknown time  . ipratropium-albuterol (DUONEB) 0.5-2.5 (3) MG/3ML SOLN Take 3 mLs by nebulization 3 (three) times daily as needed.   unknown  . isosorbide mononitrate (IMDUR) 30 MG 24 hr tablet Take 45 mg by mouth daily.   03/22/2016 at Unknown time  . latanoprost (XALATAN) 0.005 % ophthalmic solution Place 1 drop into both eyes at bedtime. Reported on 01/17/2016   03/22/2016 at Unknown time  . levothyroxine (SYNTHROID, LEVOTHROID) 200 MCG tablet Take 200 mcg by mouth daily before breakfast.   03/22/2016 at 2000  . LORazepam (ATIVAN) 1 MG tablet Take 1 mg by mouth every 6 (six) hours as needed for anxiety.   03/22/2016 at 2027  . metoprolol tartrate (LOPRESSOR) 25 MG tablet Take 1 tablet (25 mg total) by mouth 2 (two) times daily. 180 tablet 3 03/22/2016 at 2100  . Multiple Vitamin (MULTIVITAMIN WITH MINERALS) TABS tablet Take 1 tablet by mouth daily.   03/22/2016 at Unknown time  . nitroGLYCERIN (NITROSTAT) 0.4 MG SL tablet Place 0.4 mg under the tongue every 5 (five) minutes as needed for chest pain.   unknown  . nortriptyline (PAMELOR)  50 MG capsule Take 50 mg by mouth at bedtime.   03/22/2016 at Unknown time  . omeprazole (PRILOSEC) 20 MG capsule Take 20 mg by mouth daily.   03/22/2016 at Unknown time  . ondansetron (ZOFRAN) 4 MG tablet Take 4 mg by mouth every 8 (eight) hours as needed for nausea or vomiting.   03/23/2016 at 306a`  . oxyCODONE-acetaminophen (PERCOCET/ROXICET) 5-325 MG tablet Take 1 tablet by mouth every 6 (six) hours as needed for moderate pain or severe pain.   03/22/2016 at Unknown time  . phenytoin (DILANTIN) 200 MG ER capsule Take 200 mg by mouth 3 (three) times daily.   03/22/2016 at 2100  . pravastatin (PRAVACHOL) 80 MG tablet Take 80 mg by mouth daily.   03/22/2016 at Unknown time  . pregabalin (LYRICA) 75 MG capsule Take 75 mg by mouth daily.   03/22/2016 at  Unknown time  . Propylene Glycol (SYSTANE BALANCE) 0.6 % SOLN Place 1 drop into both eyes 2 (two) times daily. Both eyes   03/22/2016 at Unknown time  . senna (SENOKOT) 8.6 MG TABS tablet Take 1 tablet by mouth 2 (two) times daily.   03/22/2016 at Unknown time  . tizanidine (ZANAFLEX) 2 MG capsule Take 2 mg by mouth every 6 (six) hours as needed for muscle spasms.   03/22/2016 at Unknown time  . vitamin C (ASCORBIC ACID) 500 MG tablet Take 500 mg by mouth 2 (two) times daily.   03/23/2016 at Unknown time  . warfarin (COUMADIN) 2 MG tablet Take 7 mg by mouth every Monday, Wednesday, and Friday.   03/22/2016 at 1700  . warfarin (COUMADIN) 6 MG tablet Take 6 mg by mouth See admin instructions. Take on Sun, Tue, Thur, and Sat.   03/21/2016 at Unknown time    Assessment: 65 yo lady to continue coumadin for h/o CVA.  INR today is 1.71.  No bleeding noted Goal of Therapy:  INR 2-3 Monitor platelets by anticoagulation protocol: Yes   Plan:  Coumadin 6mg  po today Daily PT/INR  Karen Mccann 03/27/2016,9:18 AM

## 2016-03-28 DIAGNOSIS — N390 Urinary tract infection, site not specified: Secondary | ICD-10-CM | POA: Diagnosis not present

## 2016-03-28 DIAGNOSIS — G40909 Epilepsy, unspecified, not intractable, without status epilepticus: Secondary | ICD-10-CM | POA: Diagnosis not present

## 2016-03-28 DIAGNOSIS — D689 Coagulation defect, unspecified: Secondary | ICD-10-CM | POA: Diagnosis not present

## 2016-03-28 DIAGNOSIS — Z8673 Personal history of transient ischemic attack (TIA), and cerebral infarction without residual deficits: Secondary | ICD-10-CM | POA: Diagnosis not present

## 2016-03-28 LAB — CULTURE, BLOOD (ROUTINE X 2): CULTURE: NO GROWTH

## 2016-04-09 DIAGNOSIS — F411 Generalized anxiety disorder: Secondary | ICD-10-CM | POA: Diagnosis not present

## 2016-04-09 DIAGNOSIS — F332 Major depressive disorder, recurrent severe without psychotic features: Secondary | ICD-10-CM | POA: Diagnosis not present

## 2016-04-18 DIAGNOSIS — Z8673 Personal history of transient ischemic attack (TIA), and cerebral infarction without residual deficits: Secondary | ICD-10-CM | POA: Diagnosis not present

## 2016-04-19 DIAGNOSIS — I509 Heart failure, unspecified: Secondary | ICD-10-CM | POA: Diagnosis not present

## 2016-04-19 DIAGNOSIS — I4891 Unspecified atrial fibrillation: Secondary | ICD-10-CM | POA: Diagnosis not present

## 2016-04-22 DIAGNOSIS — Z7901 Long term (current) use of anticoagulants: Secondary | ICD-10-CM | POA: Diagnosis not present

## 2016-04-22 DIAGNOSIS — I4891 Unspecified atrial fibrillation: Secondary | ICD-10-CM | POA: Diagnosis not present

## 2016-04-23 DIAGNOSIS — F411 Generalized anxiety disorder: Secondary | ICD-10-CM | POA: Diagnosis not present

## 2016-04-23 DIAGNOSIS — F332 Major depressive disorder, recurrent severe without psychotic features: Secondary | ICD-10-CM | POA: Diagnosis not present

## 2016-04-25 DIAGNOSIS — E785 Hyperlipidemia, unspecified: Secondary | ICD-10-CM | POA: Diagnosis not present

## 2016-04-25 DIAGNOSIS — R4182 Altered mental status, unspecified: Secondary | ICD-10-CM | POA: Diagnosis not present

## 2016-04-25 DIAGNOSIS — Z79899 Other long term (current) drug therapy: Secondary | ICD-10-CM | POA: Diagnosis not present

## 2016-04-25 DIAGNOSIS — E119 Type 2 diabetes mellitus without complications: Secondary | ICD-10-CM | POA: Diagnosis not present

## 2016-04-25 DIAGNOSIS — E559 Vitamin D deficiency, unspecified: Secondary | ICD-10-CM | POA: Diagnosis not present

## 2016-04-26 DIAGNOSIS — I4891 Unspecified atrial fibrillation: Secondary | ICD-10-CM | POA: Diagnosis not present

## 2016-04-26 DIAGNOSIS — Z7901 Long term (current) use of anticoagulants: Secondary | ICD-10-CM | POA: Diagnosis not present

## 2016-04-29 DIAGNOSIS — Z7901 Long term (current) use of anticoagulants: Secondary | ICD-10-CM | POA: Diagnosis not present

## 2016-04-29 DIAGNOSIS — I4891 Unspecified atrial fibrillation: Secondary | ICD-10-CM | POA: Diagnosis not present

## 2016-05-03 DIAGNOSIS — M6281 Muscle weakness (generalized): Secondary | ICD-10-CM | POA: Diagnosis not present

## 2016-05-07 DIAGNOSIS — F411 Generalized anxiety disorder: Secondary | ICD-10-CM | POA: Diagnosis not present

## 2016-05-07 DIAGNOSIS — F332 Major depressive disorder, recurrent severe without psychotic features: Secondary | ICD-10-CM | POA: Diagnosis not present

## 2016-05-14 DIAGNOSIS — Z7901 Long term (current) use of anticoagulants: Secondary | ICD-10-CM | POA: Diagnosis not present

## 2016-05-14 DIAGNOSIS — I4891 Unspecified atrial fibrillation: Secondary | ICD-10-CM | POA: Diagnosis not present

## 2016-06-05 IMAGING — NM NM MYOCAR MULTI W/SPECT W/WALL MOTION & EF
2 series · 12 of 12 positions shown · non-contrast
Comparison: none

[Series 1: stress gated · 8.28mm/px · 6 of 64 frames shown]
[frame 6/64]
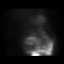
[frame 16/64]
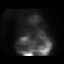
[frame 27/64]
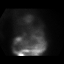
[frame 38/64]
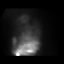
[frame 48/64]
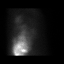
[frame 59/64]
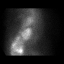

[Series 2: rest · 8.28mm/px · 6 of 64 frames shown]
[frame 6/64]
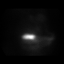
[frame 16/64]
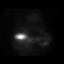
[frame 27/64]
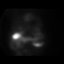
[frame 38/64]
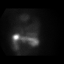
[frame 48/64]
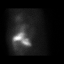
[frame 59/64]
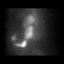

[12 of 12 positions shown; findings below may reference images not displayed]

Canned report from images found in remote index.

Refer to host system for actual result text.

## 2016-08-23 DIAGNOSIS — Z7901 Long term (current) use of anticoagulants: Secondary | ICD-10-CM | POA: Diagnosis not present

## 2016-08-23 DIAGNOSIS — H409 Unspecified glaucoma: Secondary | ICD-10-CM | POA: Diagnosis not present

## 2016-08-23 DIAGNOSIS — G894 Chronic pain syndrome: Secondary | ICD-10-CM | POA: Diagnosis not present

## 2016-08-23 DIAGNOSIS — I69354 Hemiplegia and hemiparesis following cerebral infarction affecting left non-dominant side: Secondary | ICD-10-CM | POA: Diagnosis not present

## 2016-09-30 IMAGING — CR DG CHEST 1V PORT
1 series · 1 of 1 positions shown · non-contrast
Comparison: 05/29/2015

CLINICAL DATA: Fever.  Headache.

EXAM:
PORTABLE CHEST 1 VIEW

[ap portable]
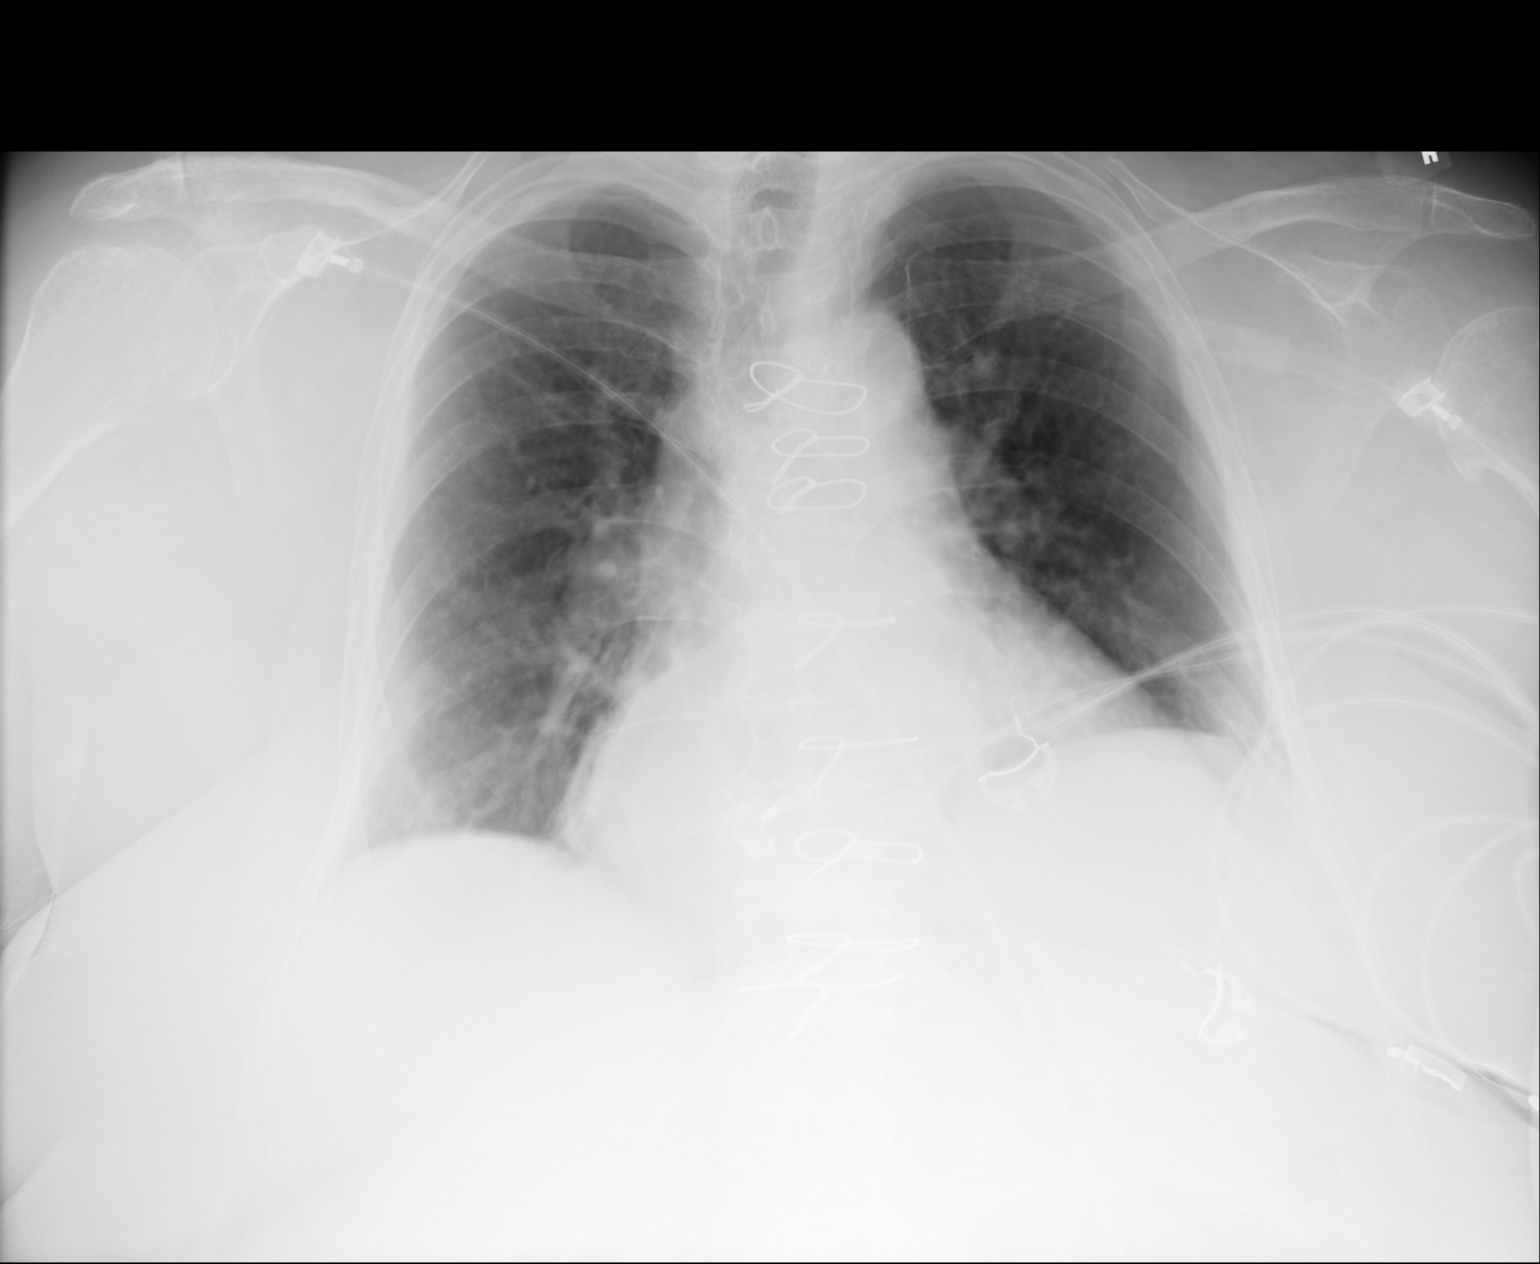

[1 of 1 positions shown; findings below may reference images not displayed]

FINDINGS: There is mild unchanged left hemidiaphragm elevation and moderate
cardiomegaly. Mild central vascular prominence. No large effusions.
No confluent airspace consolidation.
IMPRESSION: Stable cardiomegaly. Mild chronic appearing central vascular
prominence.

## 2016-10-02 IMAGING — CR DG CHEST 1V PORT
1 series · 1 of 1 positions shown · non-contrast
Comparison: Two days ago

CLINICAL DATA: PICC placement

EXAM:
PORTABLE CHEST 1 VIEW

[ap portable]
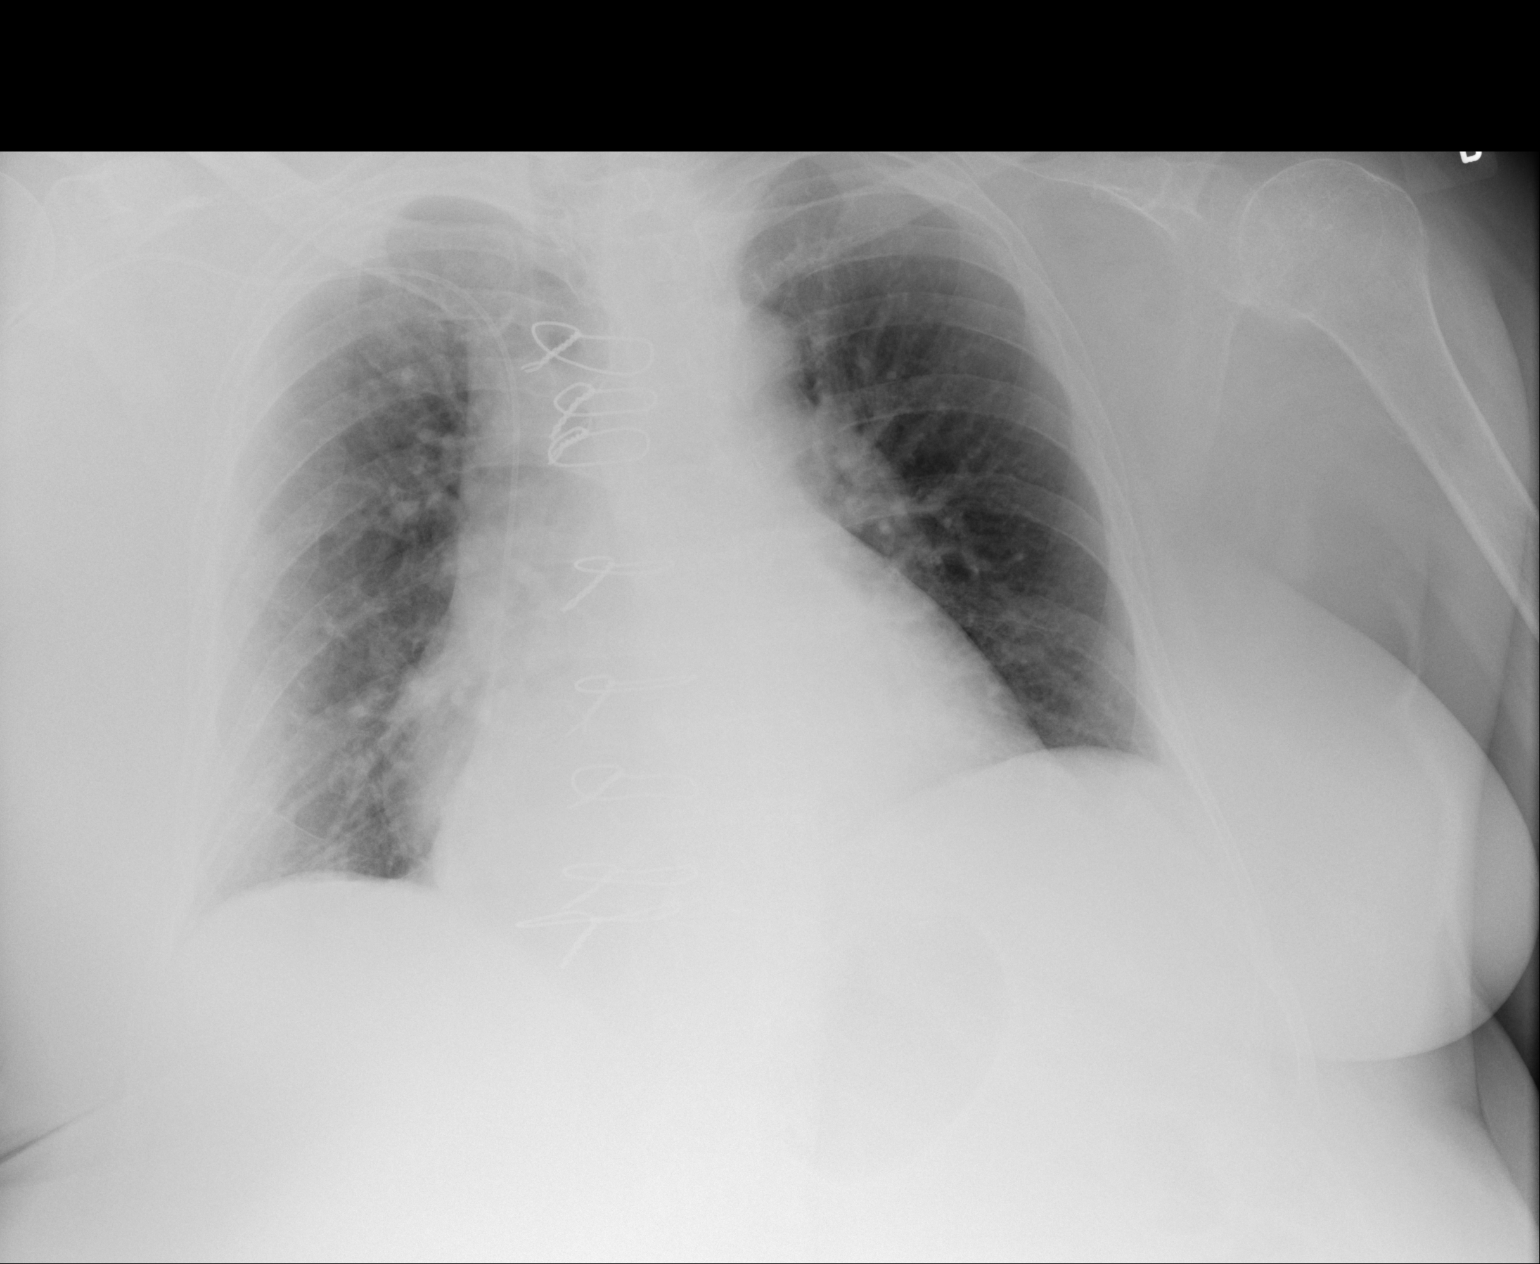

[1 of 1 positions shown; findings below may reference images not displayed]

FINDINGS: Right upper extremity PICC with tip approximately 2 cm below the
upper cavoatrial junction. Chronic cardiomegaly. Negative aortic and
hilar contours. There is no edema, consolidation, effusion, or
pneumothorax. No acute osseous finding. Patient is status post CABG.
IMPRESSION: Right upper extremity PICC with tip just below the upper cavoatrial
junction.

## 2017-02-13 DIAGNOSIS — I4891 Unspecified atrial fibrillation: Secondary | ICD-10-CM | POA: Diagnosis not present

## 2017-02-18 DIAGNOSIS — I517 Cardiomegaly: Secondary | ICD-10-CM | POA: Diagnosis not present

## 2017-03-17 DIAGNOSIS — H2513 Age-related nuclear cataract, bilateral: Secondary | ICD-10-CM | POA: Diagnosis not present

## 2017-03-17 DIAGNOSIS — H401134 Primary open-angle glaucoma, bilateral, indeterminate stage: Secondary | ICD-10-CM | POA: Diagnosis not present

## 2017-03-17 DIAGNOSIS — H04123 Dry eye syndrome of bilateral lacrimal glands: Secondary | ICD-10-CM | POA: Diagnosis not present

## 2017-03-17 DIAGNOSIS — Z7901 Long term (current) use of anticoagulants: Secondary | ICD-10-CM | POA: Diagnosis not present

## 2017-09-15 DIAGNOSIS — H04123 Dry eye syndrome of bilateral lacrimal glands: Secondary | ICD-10-CM | POA: Diagnosis not present

## 2017-09-15 DIAGNOSIS — Z7951 Long term (current) use of inhaled steroids: Secondary | ICD-10-CM | POA: Diagnosis not present

## 2017-09-15 DIAGNOSIS — H401134 Primary open-angle glaucoma, bilateral, indeterminate stage: Secondary | ICD-10-CM | POA: Diagnosis not present

## 2017-09-15 DIAGNOSIS — H2513 Age-related nuclear cataract, bilateral: Secondary | ICD-10-CM | POA: Diagnosis not present

## 2018-01-17 ENCOUNTER — Emergency Department (HOSPITAL_COMMUNITY): Payer: Medicare Other

## 2018-01-17 ENCOUNTER — Emergency Department (HOSPITAL_COMMUNITY)
Admission: EM | Admit: 2018-01-17 | Discharge: 2018-01-17 | Disposition: A | Discharge status: Home / Self Care | Payer: Medicare Other | Attending: Emergency Medicine | Admitting: Emergency Medicine

## 2018-01-17 ENCOUNTER — Encounter (HOSPITAL_COMMUNITY): Payer: Self-pay | Admitting: Emergency Medicine

## 2018-01-17 ENCOUNTER — Other Ambulatory Visit: Payer: Self-pay

## 2018-01-17 DIAGNOSIS — Y939 Activity, unspecified: Secondary | ICD-10-CM | POA: Diagnosis not present

## 2018-01-17 DIAGNOSIS — M549 Dorsalgia, unspecified: Secondary | ICD-10-CM | POA: Insufficient documentation

## 2018-01-17 DIAGNOSIS — Z87891 Personal history of nicotine dependence: Secondary | ICD-10-CM | POA: Insufficient documentation

## 2018-01-17 DIAGNOSIS — I10 Essential (primary) hypertension: Secondary | ICD-10-CM | POA: Insufficient documentation

## 2018-01-17 DIAGNOSIS — E039 Hypothyroidism, unspecified: Secondary | ICD-10-CM | POA: Diagnosis not present

## 2018-01-17 DIAGNOSIS — N390 Urinary tract infection, site not specified: Secondary | ICD-10-CM | POA: Diagnosis not present

## 2018-01-17 DIAGNOSIS — D649 Anemia, unspecified: Secondary | ICD-10-CM | POA: Diagnosis not present

## 2018-01-17 DIAGNOSIS — W19XXXA Unspecified fall, initial encounter: Secondary | ICD-10-CM

## 2018-01-17 DIAGNOSIS — Z7901 Long term (current) use of anticoagulants: Secondary | ICD-10-CM | POA: Diagnosis not present

## 2018-01-17 DIAGNOSIS — I2510 Atherosclerotic heart disease of native coronary artery without angina pectoris: Secondary | ICD-10-CM | POA: Diagnosis not present

## 2018-01-17 DIAGNOSIS — Y999 Unspecified external cause status: Secondary | ICD-10-CM | POA: Insufficient documentation

## 2018-01-17 DIAGNOSIS — J449 Chronic obstructive pulmonary disease, unspecified: Secondary | ICD-10-CM | POA: Insufficient documentation

## 2018-01-17 DIAGNOSIS — Y929 Unspecified place or not applicable: Secondary | ICD-10-CM | POA: Insufficient documentation

## 2018-01-17 DIAGNOSIS — I69354 Hemiplegia and hemiparesis following cerebral infarction affecting left non-dominant side: Secondary | ICD-10-CM | POA: Diagnosis not present

## 2018-01-17 LAB — URINALYSIS, ROUTINE W REFLEX MICROSCOPIC
Bilirubin Urine: NEGATIVE
Glucose, UA: NEGATIVE mg/dL
Hgb urine dipstick: NEGATIVE
KETONES UR: NEGATIVE mg/dL
Nitrite: POSITIVE — AB
PH: 6 (ref 5.0–8.0)
PROTEIN: 30 mg/dL — AB
Specific Gravity, Urine: 1.017 (ref 1.005–1.030)

## 2018-01-17 LAB — I-STAT CHEM 8, ED
BUN: 11 mg/dL (ref 6–20)
CREATININE: 0.6 mg/dL (ref 0.44–1.00)
Calcium, Ion: 1.13 mmol/L — ABNORMAL LOW (ref 1.15–1.40)
Chloride: 100 mmol/L — ABNORMAL LOW (ref 101–111)
Glucose, Bld: 89 mg/dL (ref 65–99)
HCT: 32 % — ABNORMAL LOW (ref 36.0–46.0)
HEMOGLOBIN: 10.9 g/dL — AB (ref 12.0–15.0)
POTASSIUM: 4 mmol/L (ref 3.5–5.1)
Sodium: 141 mmol/L (ref 135–145)
TCO2: 31 mmol/L (ref 22–32)

## 2018-01-17 MED ORDER — CEPHALEXIN 500 MG PO CAPS
500.0000 mg | ORAL_CAPSULE | Freq: Once | ORAL | Status: AC
  Administered 2018-01-17: 500 mg via ORAL
  Filled 2018-01-17: qty 1

## 2018-01-17 MED ORDER — CEPHALEXIN 500 MG PO CAPS: 500.0000 mg | ORAL_CAPSULE | Freq: Four times a day (QID) | ORAL | 0 refills | Status: DC

## 2018-01-17 NOTE — ED Notes (Signed)
Rounded on pt, she is resting comfortably watching television.

## 2018-01-17 NOTE — ED Notes (Signed)
Rounded on pt, given blanket for comfort. Resting comfortably watching television.

## 2018-01-17 NOTE — ED Provider Notes (Signed)
Emergency Department Provider Note   I have reviewed the triage vital signs and the nursing notes.   HISTORY  Chief Complaint Fall   HPI Karen Mccann is a 67 y.o. female with multiple medical problems as documented below the presents to the emergency department tonight secondary to a fall.  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  She states that she was trying to stabilize herself with her right leg and slid out of bed landing on her back.  She says she laid there for "quite a while".  She did not hit her head or lose consciousness.  She does not have a headache at this time.  She is only complaining of new pain in her back.  No pain in her extremities or neurologic changes.  No other new pain elsewhere. No other associated or modifying symptoms.    Past Medical History:  Diagnosis Date  . Bipolar 1 disorder, depressed ([place])   . COPD (chronic obstructive pulmonary disease) ([place])   . Depression   . Gastroesophageal reflux   . Heart failure ([place])   . Hemiparesis ([place])   . Hemiplegia ([place])   . Stroke ([place])   . Thyroid disease     Patient Active Problem List   Diagnosis Date Noted  . Sepsis ([place]) 03/23/2016  . 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 UTI (lower urinary tract infection) 09/17/2015  . Stroke ([place]) 05/09/2015  . Hypothyroid 05/09/2015  . Bipolar 1 disorder ([place]) 05/09/2015  . Seizures ([place]) 05/09/2015  . Hyperlipidemia 05/09/2015  . COPD (chronic obstructive pulmonary disease) ([place]) 05/09/2015  . Depression 05/09/2015  . Anxiety 05/09/2015  . General weakness 05/09/2015  . CAD (coronary artery disease) 05/09/2015  . 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 HTN (hypertension) 05/09/2015  . Chronic pain 05/09/2015  . Esophageal reflux disease 05/09/2015  . Other heart disorders in diseases classified elsewhere 05/09/2015  . Paraplegia ([place]) 05/09/2015    History reviewed. No pertinent surgical  history.  Current Outpatient Rx  . Order #: [id number] Class: Historical Med  . Order #: [id number] Class: Historical Med  . Order #: [id number] Class: Historical Med  . Order #: [id number] Class: Print  . Order #: [id number] Class: Historical Med  . Order #: [id number] Class: Historical Med  . Order #: [id number] Class: Historical Med  . Order #: [id number] Class: Historical Med  . Order #: [id number] Class: Historical Med  . Order #: [id number] Class: Historical Med  . Order #: [id number] Class: Historical Med  . Order #: [id number] Class: Historical Med  . Order #: [id number] Class: Historical Med  . Order #: [id number] Class: Historical Med  . Order #: [id number] Class: Historical Med  . Order #: [id number] Class: Historical Med  . Order #: [id number] Class: Historical Med  . Order #: [id number] Class: Normal  . Order #: [id number] Class: Historical Med  . Order #: [id number] Class: Historical Med  . Order #: [id number] Class: Historical Med  . Order #: [id number] Class: Historical Med  . Order #: [id number] Class: Historical Med  . Order #: [id number] Class: Historical Med  . Order #: [id number] Class: Historical Med  . Order #: [id number] Class: Historical Med  . Order #: [id number] Class: Historical Med  . Order #: [id number] Class: Historical Med  . Order #: [id number] Class: Historical Med  . Order #: [id number] Class: Historical Med  . Order #: [id number] Class: Historical Med  . Order #: [id number] Class: Historical Med  . Order #: [id number] Class: Historical Med    Allergies Latex  Family History  Problem Relation Age of Onset  . Hypertension Other  Social History Social History   Tobacco Use  . Smoking status: Former Smoker    Packs/day: 0.50    Types: Cigarettes    Last attempt to quit: 05/16/2003    Years since quitting: 14.6  . Smokeless tobacco: Never Used  Substance Use Topics  . Alcohol use: No    Alcohol/week: 0.0 oz  . Drug use: Not on file    Review of Systems  All other systems negative except as  documented in the HPI. All pertinent positives and negatives as reviewed in the HPI. ____________________________________________   PHYSICAL EXAM:  VITAL SIGNS: ED Triage Vitals [01/17/18 0030]  Enc Vitals Group     BP 123/75     Pulse Rate 81     Resp 17     Temp 99.1 F (37.3 C)     Temp Source Oral     SpO2 97 %     Weight 271 lb (122.9 kg)    Constitutional: Alert and oriented. Well appearing and in no acute distress. Eyes: Conjunctivae are normal. PERRL. EOMI. Head: Atraumatic. Nose: No congestion/rhinnorhea. Mouth/Throat: Mucous membranes are moist.  Oropharynx non-erythematous. Neck: No stridor.  No meningeal signs.   Cardiovascular: Normal rate, regular rhythm. Good peripheral circulation. Grossly normal heart sounds.   Respiratory: Normal respiratory effort.  No retractions. Lungs CTAB. Gastrointestinal: Soft and nontender. No distention.  Musculoskeletal: No lower extremity tenderness nor edema. No gross deformities of extremities.  Mild tenderness to palpation lumbar spine.  No tenderness in her upper extremities lower extremities, pelvis.  No tenderness in her thoracic spine or cervical spine. Neurologic:  Normal speech and language. No gross focal neurologic deficits are appreciated.  Skin:  Skin is warm, dry and intact. No rash noted.   ____________________________________________   LABS (all labs ordered are listed, but only abnormal results are displayed)  Labs Reviewed  URINALYSIS, ROUTINE W REFLEX MICROSCOPIC - Abnormal; Notable for the following components:      Result Value   APPearance HAZY (*)    Protein, ur 30 (*)    Nitrite POSITIVE (*)    Leukocytes, UA LARGE (*)    Bacteria, UA MANY (*)    Squamous Epithelial / LPF 0-5 (*)    All other components within normal limits  I-STAT CHEM 8, ED - Abnormal; Notable for the following components:   Chloride 100 (*)    Calcium, Ion 1.13 (*)    Hemoglobin 10.9 (*)    HCT 32.0 (*)    All other  components within normal limits  URINE CULTURE   ____________________________________________  RADIOLOGY  Dg Lumbar Spine Complete  Result Date: 01/17/2018 CLINICAL DATA:  Low back pain after fall tonight. EXAM: LUMBAR SPINE - COMPLETE 4+ VIEW COMPARISON:  02/11/2006 FINDINGS: Diffuse bone demineralization. Curvature of the lumbar spine convex to the right is probably positional. Mild anterior subluxation of L5 on the sacrum without change since prior study. Otherwise normal alignment. No vertebral compression deformities. Prominent degenerative changes in the lower thoracic and lumbar spine with bridging anterior osteophytes and loss of disc space height. There is progression since the previous study. No focal bone lesion or bone destruction. Vascular calcifications. Calcification in the right upper quadrant probably represents a renal stone. IMPRESSION: 1. No acute displaced fractures identified. 2. Severe chronic degenerative changes with progression since previous study. 3. Slight anterior subluxation of L5 on S1 without change. 4. Aortic atherosclerosis. 5. Probable right renal stone. Electronically Signed   By: Oren Beckmann.D.  On: 01/17/2018 01:28   Dg Pelvis 1-2 Views  Result Date: 01/17/2018 CLINICAL DATA:  Low back pain after a fall tonight. EXAM: PELVIS - 1-2 VIEW COMPARISON:  None. FINDINGS: Diffuse bone demineralization. Degenerative changes in the lumbar spine and hips. Pelvis appears intact. No acute displaced fractures identified. SI joint and symphysis pubis are not displaced. Visualized sacral struts appear intact. Calcified phleboliths in the pelvis. IMPRESSION: Diffuse bone demineralization and degenerative changes. No acute displaced fractures identified in the pelvis. Electronically Signed   By: Lucienne Capers M.D.   On: 01/17/2018 01:29    ____________________________________________   PROCEDURES  Procedure(s) performed:    Procedures   ____________________________________________   INITIAL IMPRESSION / ASSESSMENT AND PLAN / ED COURSE  Low suspicion for injury however since she does have lower back pain it seems to be new we will get a pelvis and lumbar x-rays.  We will check a urine and i-STAT just to ensure there is no immediate causes for weakness.  If this is all stable then patient can likely be discharged back to the facility.  Suspicion for head bleed without head injury, headache, syncope or other red flags and otherwise alert oriented patient.  We will continue to monitor in the emergency room.  Workup unremarkable aside from UTI. Started PO abx. Stable for dc back to facility.    Pertinent labs & imaging results that were available during my care of the patient were reviewed by me and considered in my medical decision making (see chart for details).  ____________________________________________  FINAL CLINICAL IMPRESSION(S) / ED DIAGNOSES  Final diagnoses:  Fall, initial encounter  Urinary tract infection without hematuria, site unspecified  Anemia, unspecified type     MEDICATIONS GIVEN DURING THIS VISIT:  Medications  cephALEXin (KEFLEX) capsule 500 mg (500 mg Oral Given 01/17/18 0215)     NEW OUTPATIENT MEDICATIONS STARTED DURING THIS VISIT:  Discharge Medication List as of 01/17/2018  2:07 AM    START taking these medications   Details  cephALEXin (KEFLEX) 500 MG capsule Take 1 capsule (500 mg total) by mouth 4 (four) times daily., Starting Sat 01/17/2018, Print        Note:  This note was prepared with assistance of Dragon voice recognition software. Occasional wrong-word or sound-a-like substitutions may have occurred due to the inherent limitations of voice recognition software.   Tagen Brethauer, Corene Cornea, MD 01/17/18 [phone]

## 2018-01-17 NOTE — ED Triage Notes (Signed)
Pt comes from [facility]. Per EMS pt slid out of bed tonight. Pt denies any pain from the fall. Pt denies hitting her head. Pt was advised by staff at facility to come to ER tonight.

## 2018-01-19 LAB — URINE CULTURE: Culture: >=100000 — AB

## 2018-01-20 ENCOUNTER — Telehealth: Payer: Self-pay | Admitting: Emergency Medicine

## 2018-01-20 NOTE — Telephone Encounter (Signed)
Post ED Visit - Positive Culture Follow-up: Successful Patient Follow-Up  Culture assessed and recommendations reviewed by: []  Elenor Quinones, Pharm.D. [x]  Heide Guile, Pharm.D., BCPS AQ-ID []  Parks Neptune, Pharm.D., BCPS []  Alycia Rossetti, Pharm.D., BCPS []  [place], Pharm.D., BCPS, AAHIVP []  Legrand Como, Pharm.D., BCPS, AAHIVP []  Salome Arnt, PharmD, BCPS []  Dimitri Ped, PharmD, BCPS []  Vincenza Hews, PharmD, BCPS  Positive urine culture  []  Patient discharged without antimicrobial prescription and treatment is now indicated [x]  Organism is resistant to prescribed ED discharge antimicrobial []  Patient with positive blood cultures  Changes discussed with ED provider: Rodell Perna [facility] New antibiotic prescription stop cephalexin, no further treatment at present  Culture report with recommended change in treatment [facility] , @ [phone]   Hazle Nordmann 01/20/2018, 2:57 PM

## 2018-01-20 NOTE — Progress Notes (Addendum)
ED Antimicrobial Stewardship Positive Culture Follow Up   Karen Mccann is an 67 y.o. female who presented to [facility] on 01/17/2018 with a chief complaint of  Chief Complaint  Patient presents with  . Fall    Recent Results (from the past 720 hour(s))  Urine Culture     Status: Abnormal   Collection Time: 01/17/18  1:01 AM  Result Value Ref Range Status   Specimen Description   Final    URINE, CATHETERIZED Performed at [facility], [address], [place], [place] [address]    Special Requests   Final    NONE Performed at [facility], [address] [place], [place] [address]    Culture (A)  Final    >=100,000 COLONIES/mL ESCHERICHIA COLI Confirmed Extended Spectrum Beta-Lactamase Producer (ESBL).  In bloodstream infections from ESBL organisms, carbapenems are preferred over piperacillin/tazobactam. They are shown to have a lower risk of mortality. Performed at [facility], [place] [address], [place],  [address]    Report Status 01/19/2018 FINAL  Final   Organism ID, Bacteria ESCHERICHIA COLI (A)  Final      Susceptibility   Escherichia coli - MIC*    AMPICILLIN >=32 RESISTANT Resistant     CEFAZOLIN >=64 RESISTANT Resistant     CEFTRIAXONE >=64 RESISTANT Resistant     CIPROFLOXACIN >=4 RESISTANT Resistant     GENTAMICIN <=1 SENSITIVE Sensitive     IMIPENEM <=0.25 SENSITIVE Sensitive     NITROFURANTOIN 128 RESISTANT Resistant     TRIMETH/SULFA >=320 RESISTANT Resistant     AMPICILLIN/SULBACTAM 16 INTERMEDIATE Intermediate     PIP/TAZO <=4 SENSITIVE Sensitive     Extended ESBL POSITIVE Resistant     * >=100,000 COLONIES/mL ESCHERICHIA COLI   E.coli isolated from urine culture, resistant to oral antibiotics.  Pt did not report any urinary symptoms or flank pain. Fall reported to be from sliding out of bed and pt with history of stroke and left sided paresis.   Plan to stop cephalexin. No further antibiotic treatment.   ED Provider: Denton Ar PA-C   Karmen Stabs, PharmD Candidate 01/20/2018, 10:00 AM Phone# [phone]

## 2018-05-12 ENCOUNTER — Inpatient Hospital Stay (HOSPITAL_COMMUNITY)
Admission: EM | Admit: 2018-05-12 | Discharge: 2018-05-15 | DRG: 812 | Disposition: A | Discharge status: Skilled Nursing Facility | Payer: Medicare Other | Attending: Internal Medicine | Admitting: Internal Medicine

## 2018-05-12 ENCOUNTER — Emergency Department (HOSPITAL_COMMUNITY): Payer: Medicare Other

## 2018-05-12 ENCOUNTER — Encounter (HOSPITAL_COMMUNITY): Payer: Self-pay

## 2018-05-12 ENCOUNTER — Other Ambulatory Visit: Payer: Self-pay

## 2018-05-12 DIAGNOSIS — Z66 Do not resuscitate: Secondary | ICD-10-CM | POA: Diagnosis present

## 2018-05-12 DIAGNOSIS — Z7982 Long term (current) use of aspirin: Secondary | ICD-10-CM | POA: Diagnosis not present

## 2018-05-12 DIAGNOSIS — D500 Iron deficiency anemia secondary to blood loss (chronic): Principal | ICD-10-CM | POA: Diagnosis present

## 2018-05-12 DIAGNOSIS — Z951 Presence of aortocoronary bypass graft: Secondary | ICD-10-CM | POA: Diagnosis not present

## 2018-05-12 DIAGNOSIS — E039 Hypothyroidism, unspecified: Secondary | ICD-10-CM | POA: Diagnosis present

## 2018-05-12 DIAGNOSIS — Z91040 Latex allergy status: Secondary | ICD-10-CM | POA: Diagnosis not present

## 2018-05-12 DIAGNOSIS — N390 Urinary tract infection, site not specified: Secondary | ICD-10-CM | POA: Diagnosis present

## 2018-05-12 DIAGNOSIS — R569 Unspecified convulsions: Secondary | ICD-10-CM | POA: Diagnosis present

## 2018-05-12 DIAGNOSIS — I509 Heart failure, unspecified: Secondary | ICD-10-CM | POA: Diagnosis present

## 2018-05-12 DIAGNOSIS — Z6841 Body Mass Index (BMI) 40.0 and over, adult: Secondary | ICD-10-CM

## 2018-05-12 DIAGNOSIS — K449 Diaphragmatic hernia without obstruction or gangrene: Secondary | ICD-10-CM | POA: Diagnosis present

## 2018-05-12 DIAGNOSIS — Z8249 Family history of ischemic heart disease and other diseases of the circulatory system: Secondary | ICD-10-CM | POA: Diagnosis not present

## 2018-05-12 DIAGNOSIS — D649 Anemia, unspecified: Secondary | ICD-10-CM | POA: Diagnosis present

## 2018-05-12 DIAGNOSIS — I2510 Atherosclerotic heart disease of native coronary artery without angina pectoris: Secondary | ICD-10-CM | POA: Diagnosis present

## 2018-05-12 DIAGNOSIS — I10 Essential (primary) hypertension: Secondary | ICD-10-CM | POA: Diagnosis present

## 2018-05-12 DIAGNOSIS — Z87891 Personal history of nicotine dependence: Secondary | ICD-10-CM

## 2018-05-12 DIAGNOSIS — Z79890 Hormone replacement therapy (postmenopausal): Secondary | ICD-10-CM | POA: Diagnosis not present

## 2018-05-12 DIAGNOSIS — K219 Gastro-esophageal reflux disease without esophagitis: Secondary | ICD-10-CM | POA: Diagnosis present

## 2018-05-12 DIAGNOSIS — Z79899 Other long term (current) drug therapy: Secondary | ICD-10-CM

## 2018-05-12 DIAGNOSIS — E785 Hyperlipidemia, unspecified: Secondary | ICD-10-CM | POA: Diagnosis present

## 2018-05-12 DIAGNOSIS — F3130 Bipolar disorder, current episode depressed, mild or moderate severity, unspecified: Secondary | ICD-10-CM | POA: Diagnosis present

## 2018-05-12 DIAGNOSIS — B372 Candidiasis of skin and nail: Secondary | ICD-10-CM | POA: Diagnosis present

## 2018-05-12 DIAGNOSIS — K3189 Other diseases of stomach and duodenum: Secondary | ICD-10-CM | POA: Diagnosis not present

## 2018-05-12 DIAGNOSIS — Z7901 Long term (current) use of anticoagulants: Secondary | ICD-10-CM

## 2018-05-12 DIAGNOSIS — Z993 Dependence on wheelchair: Secondary | ICD-10-CM

## 2018-05-12 DIAGNOSIS — J449 Chronic obstructive pulmonary disease, unspecified: Secondary | ICD-10-CM | POA: Diagnosis present

## 2018-05-12 DIAGNOSIS — I69354 Hemiplegia and hemiparesis following cerebral infarction affecting left non-dominant side: Secondary | ICD-10-CM

## 2018-05-12 DIAGNOSIS — D120 Benign neoplasm of cecum: Secondary | ICD-10-CM | POA: Diagnosis not present

## 2018-05-12 DIAGNOSIS — E038 Other specified hypothyroidism: Secondary | ICD-10-CM | POA: Diagnosis not present

## 2018-05-12 DIAGNOSIS — Z7401 Bed confinement status: Secondary | ICD-10-CM

## 2018-05-12 DIAGNOSIS — K6289 Other specified diseases of anus and rectum: Secondary | ICD-10-CM | POA: Diagnosis not present

## 2018-05-12 LAB — CBC WITH DIFFERENTIAL/PLATELET
BASOS ABS: 0 10*3/uL (ref 0.0–0.1)
Basophils Relative: 0 %
EOS ABS: 0.1 10*3/uL (ref 0.0–0.7)
EOS PCT: 2 %
HCT: 21.4 % — ABNORMAL LOW (ref 36.0–46.0)
Hemoglobin: 5.2 g/dL — CL (ref 12.0–15.0)
LYMPHS PCT: 23 %
Lymphs Abs: 1.9 10*3/uL (ref 0.7–4.0)
MCH: 17.3 pg — ABNORMAL LOW (ref 26.0–34.0)
MCHC: 24.3 g/dL — ABNORMAL LOW (ref 30.0–36.0)
MCV: 71.1 fL — AB (ref 78.0–100.0)
Monocytes Absolute: 0.8 10*3/uL (ref 0.1–1.0)
Monocytes Relative: 10 %
Neutro Abs: 5.3 10*3/uL (ref 1.7–7.7)
Neutrophils Relative %: 65 %
PLATELETS: 298 10*3/uL (ref 150–400)
RBC: 3.01 MIL/uL — AB (ref 3.87–5.11)
RDW: 20.1 % — AB (ref 11.5–15.5)
WBC: 8.1 10*3/uL (ref 4.0–10.5)

## 2018-05-12 LAB — COMPREHENSIVE METABOLIC PANEL
ALBUMIN: 2.7 g/dL — AB (ref 3.5–5.0)
ALK PHOS: 155 U/L — AB (ref 38–126)
ALT: 15 U/L (ref 0–44)
ANION GAP: 5 (ref 5–15)
AST: 14 U/L — AB (ref 15–41)
BILIRUBIN TOTAL: 0.3 mg/dL (ref 0.3–1.2)
BUN: 12 mg/dL (ref 8–23)
CALCIUM: 8 mg/dL — AB (ref 8.9–10.3)
CO2: 28 mmol/L (ref 22–32)
Chloride: 109 mmol/L (ref 98–111)
Creatinine, Ser: 0.52 mg/dL (ref 0.44–1.00)
GFR calc Af Amer: >60 mL/min (ref >60)
GFR calc non Af Amer: >60 mL/min (ref >60)
GLUCOSE: 110 mg/dL — AB (ref 70–99)
Potassium: 3.8 mmol/L (ref 3.5–5.1)
Sodium: 142 mmol/L (ref 135–145)
TOTAL PROTEIN: 6.1 g/dL — AB (ref 6.5–8.1)

## 2018-05-12 LAB — URINALYSIS, ROUTINE W REFLEX MICROSCOPIC
Bilirubin Urine: NEGATIVE
Glucose, UA: NEGATIVE mg/dL
Hgb urine dipstick: NEGATIVE
Ketones, ur: NEGATIVE mg/dL
Nitrite: NEGATIVE
PH: 7 (ref 5.0–8.0)
Protein, ur: NEGATIVE mg/dL
SPECIFIC GRAVITY, URINE: 1.017 (ref 1.005–1.030)
WBC, UA: >50 WBC/hpf — ABNORMAL HIGH (ref 0–5)

## 2018-05-12 LAB — RETICULOCYTES
RBC.: 3.01 MIL/uL — ABNORMAL LOW (ref 3.87–5.11)
Retic Count, Absolute: 114.4 10*3/uL (ref 19.0–186.0)
Retic Ct Pct: 3.8 % — ABNORMAL HIGH (ref 0.4–3.1)

## 2018-05-12 LAB — PREPARE RBC (CROSSMATCH)

## 2018-05-12 MED ORDER — ISOSORBIDE MONONITRATE ER 30 MG PO TB24
45.0000 mg | ORAL_TABLET | Freq: Every day | ORAL | Status: DC
  Administered 2018-05-13 – 2018-05-15 (×3): 45 mg via ORAL
  Filled 2018-05-12 (×3): qty 2

## 2018-05-12 MED ORDER — VITAMIN B-12 1000 MCG PO TABS
1000.0000 ug | ORAL_TABLET | Freq: Every day | ORAL | Status: DC
  Administered 2018-05-13 – 2018-05-15 (×3): 1000 ug via ORAL
  Filled 2018-05-12 (×3): qty 1

## 2018-05-12 MED ORDER — PHENYTOIN SODIUM EXTENDED 100 MG PO CAPS
200.0000 mg | ORAL_CAPSULE | Freq: Three times a day (TID) | ORAL | Status: DC
  Administered 2018-05-12 – 2018-05-15 (×7): 200 mg via ORAL
  Filled 2018-05-12 (×7): qty 2

## 2018-05-12 MED ORDER — FUROSEMIDE 10 MG/ML IJ SOLN
20.0000 mg | Freq: Once | INTRAMUSCULAR | Status: AC
  Administered 2018-05-13: 20 mg via INTRAVENOUS
  Filled 2018-05-12 (×2): qty 2

## 2018-05-12 MED ORDER — PANTOPRAZOLE SODIUM 40 MG PO TBEC
40.0000 mg | DELAYED_RELEASE_TABLET | Freq: Every day | ORAL | Status: DC
  Administered 2018-05-13 – 2018-05-15 (×3): 40 mg via ORAL
  Filled 2018-05-12 (×3): qty 1

## 2018-05-12 MED ORDER — IPRATROPIUM-ALBUTEROL 0.5-2.5 (3) MG/3ML IN SOLN: 3.0000 mL | Freq: Four times a day (QID) | RESPIRATORY_TRACT | Status: DC | PRN

## 2018-05-12 MED ORDER — LORAZEPAM 0.5 MG PO TABS
0.5000 mg | ORAL_TABLET | Freq: Every day | ORAL | Status: DC
  Administered 2018-05-12 – 2018-05-14 (×3): 0.5 mg via ORAL
  Filled 2018-05-12 (×3): qty 1

## 2018-05-12 MED ORDER — ONDANSETRON HCL 4 MG PO TABS: 4.0000 mg | ORAL_TABLET | Freq: Four times a day (QID) | ORAL | Status: DC | PRN

## 2018-05-12 MED ORDER — PRAVASTATIN SODIUM 40 MG PO TABS
80.0000 mg | ORAL_TABLET | Freq: Every day | ORAL | Status: DC
  Administered 2018-05-13 – 2018-05-15 (×3): 80 mg via ORAL
  Filled 2018-05-12 (×2): qty 2
  Filled 2018-05-12: qty 8

## 2018-05-12 MED ORDER — SODIUM CHLORIDE 0.9 % IV SOLN
1.0000 g | Freq: Once | INTRAVENOUS | Status: AC
  Administered 2018-05-12: 1 g via INTRAVENOUS
  Filled 2018-05-12: qty 10

## 2018-05-12 MED ORDER — LATANOPROST 0.005 % OP SOLN
1.0000 [drp] | Freq: Every day | OPHTHALMIC | Status: DC
  Administered 2018-05-13 – 2018-05-14 (×2): 1 [drp] via OPHTHALMIC
  Filled 2018-05-12: qty 2.5

## 2018-05-12 MED ORDER — ESCITALOPRAM OXALATE 10 MG PO TABS
20.0000 mg | ORAL_TABLET | Freq: Every day | ORAL | Status: DC
  Administered 2018-05-13 – 2018-05-15 (×3): 20 mg via ORAL
  Filled 2018-05-12 (×3): qty 2

## 2018-05-12 MED ORDER — ONDANSETRON HCL 4 MG/2ML IJ SOLN
4.0000 mg | Freq: Four times a day (QID) | INTRAMUSCULAR | Status: DC | PRN
  Administered 2018-05-12 – 2018-05-14 (×2): 4 mg via INTRAVENOUS
  Filled 2018-05-12 (×2): qty 2

## 2018-05-12 MED ORDER — FUROSEMIDE 20 MG PO TABS
20.0000 mg | ORAL_TABLET | Freq: Every day | ORAL | Status: DC
  Administered 2018-05-13 – 2018-05-15 (×3): 20 mg via ORAL
  Filled 2018-05-12 (×3): qty 1

## 2018-05-12 MED ORDER — METOPROLOL TARTRATE 25 MG PO TABS
25.0000 mg | ORAL_TABLET | Freq: Two times a day (BID) | ORAL | Status: DC
  Administered 2018-05-13 – 2018-05-15 (×5): 25 mg via ORAL
  Filled 2018-05-12 (×5): qty 1

## 2018-05-12 MED ORDER — LEVOTHYROXINE SODIUM 100 MCG PO TABS
300.0000 ug | ORAL_TABLET | Freq: Every day | ORAL | Status: DC
  Administered 2018-05-13 – 2018-05-15 (×3): 300 ug via ORAL
  Filled 2018-05-12 (×4): qty 3

## 2018-05-12 MED ORDER — HYDROXYZINE HCL 25 MG PO TABS: 25.0000 mg | ORAL_TABLET | Freq: Every day | ORAL | Status: DC | PRN

## 2018-05-12 MED ORDER — LORATADINE 10 MG PO TABS
10.0000 mg | ORAL_TABLET | Freq: Every day | ORAL | Status: DC
  Administered 2018-05-13 – 2018-05-15 (×3): 10 mg via ORAL
  Filled 2018-05-12 (×3): qty 1

## 2018-05-12 MED ORDER — ACETAMINOPHEN 325 MG PO TABS
650.0000 mg | ORAL_TABLET | Freq: Four times a day (QID) | ORAL | Status: DC | PRN
  Administered 2018-05-12: 650 mg via ORAL
  Filled 2018-05-12: qty 2

## 2018-05-12 MED ORDER — SODIUM CHLORIDE 0.9 % IV SOLN: INTRAVENOUS | Status: DC

## 2018-05-12 MED ORDER — POLYETHYLENE GLYCOL 3350 17 GM/SCOOP PO POWD
17.0000 g | Freq: Every day | ORAL | Status: DC
  Filled 2018-05-12: qty 255

## 2018-05-12 MED ORDER — SODIUM CHLORIDE 0.9 % IV BOLUS
500.0000 mL | Freq: Once | INTRAVENOUS | Status: AC
Start: 2018-05-12 — End: 2018-05-12
  Administered 2018-05-12: 500 mL via INTRAVENOUS

## 2018-05-12 MED ORDER — ACETAMINOPHEN 650 MG RE SUPP: 650.0000 mg | Freq: Four times a day (QID) | RECTAL | Status: DC | PRN

## 2018-05-12 MED ORDER — POLYVINYL ALCOHOL 1.4 % OP SOLN
1.0000 [drp] | Freq: Two times a day (BID) | OPHTHALMIC | Status: DC
  Administered 2018-05-13 – 2018-05-15 (×5): 1 [drp] via OPHTHALMIC
  Filled 2018-05-12: qty 15

## 2018-05-12 MED ORDER — DULOXETINE HCL 30 MG PO CPEP
30.0000 mg | ORAL_CAPSULE | Freq: Every day | ORAL | Status: DC
  Administered 2018-05-13 – 2018-05-15 (×3): 30 mg via ORAL
  Filled 2018-05-12 (×3): qty 1

## 2018-05-12 MED ORDER — PREGABALIN 75 MG PO CAPS
75.0000 mg | ORAL_CAPSULE | Freq: Every day | ORAL | Status: DC
  Administered 2018-05-13 – 2018-05-15 (×3): 75 mg via ORAL
  Filled 2018-05-12 (×3): qty 1

## 2018-05-12 MED ORDER — SODIUM CHLORIDE 0.9% IV SOLUTION: Freq: Once | INTRAVENOUS | Status: DC

## 2018-05-12 MED ORDER — OXYCODONE-ACETAMINOPHEN 5-325 MG PO TABS
1.0000 | ORAL_TABLET | Freq: Four times a day (QID) | ORAL | Status: DC | PRN
  Administered 2018-05-12: 1 via ORAL
  Filled 2018-05-12: qty 1

## 2018-05-12 MED ORDER — TIZANIDINE HCL 2 MG PO TABS
2.0000 mg | ORAL_TABLET | Freq: Four times a day (QID) | ORAL | Status: DC | PRN
  Administered 2018-05-13: 2 mg via ORAL
  Filled 2018-05-12: qty 1

## 2018-05-12 NOTE — ED Triage Notes (Signed)
Pt to er via ems from [facility]. Reports had blood drawn today H/H 5.2/18.9. No know GI BLEEDING.

## 2018-05-12 NOTE — ED Notes (Signed)
Pt consented for blood transfusion. Informed by blood bank pt with multiple antibodies on type and screen. Pt to receive units from [place]. Pt aware.

## 2018-05-12 NOTE — ED Notes (Signed)
Pt with noted pale skin and pale sclera. Pt denies blood in urine or stool. Had blood drawn today and noted to have low H/H. Pt reports hx of same 5-6 yrs ago resulting  In transfusion.

## 2018-05-12 NOTE — ED Notes (Signed)
CRITICAL VALUE ALERT  Critical Value:  Hgb 5.2  Date & Time Notied:  05/12/18, 1757  Provider Notified: Dr. Roderic Palau  Orders Received/Actions taken: no new orders

## 2018-05-12 NOTE — ED Notes (Signed)
In/out catheter procedure completed as per protocol. Cloudy urine with sediment noted. Sent to lab.

## 2018-05-12 NOTE — ED Provider Notes (Signed)
[facility] EMERGENCY DEPARTMENT Provider Note   CSN: [id number] Arrival date & time: 05/12/18  1623     History   Chief Complaint Chief Complaint  Patient presents with  . Neutropenia    HPI Karen Mccann is a 67 y.o. female.  Patient has a history of a stroke and is unable to use her left arm and left leg.  She was brought in here for weakness and blood test that showed low hemoglobin.  Patient has a history of low hemoglobin and has been transfused before.  She states they did not find out why her hemoglobin was low  The history is provided by the patient.  Weakness  Primary symptoms include no focal weakness. This is a recurrent problem. The current episode started yesterday. The problem has not changed since onset.There was no focality noted. The maximum temperature recorded prior to her arrival was 100 to 100.9 F. The fever has been present for less than 1 day. Pertinent negatives include no shortness of breath, no chest pain and no headaches. There were no medications administered prior to arrival. Associated medical issues do not include trauma.    Past Medical History:  Diagnosis Date  . Bipolar 1 disorder, depressed ([place])   . COPD (chronic obstructive pulmonary disease) ([place])   . Depression   . Gastroesophageal reflux   . Heart failure ([place])   . Hemiparesis ([place])   . Hemiplegia ([place])   . Stroke ([place])   . Thyroid disease     Patient Active Problem List   Diagnosis Date Noted  . Sepsis ([place]) 03/23/2016  . 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 UTI (lower urinary tract infection) 09/17/2015  . Stroke ([place]) 05/09/2015  . Hypothyroid 05/09/2015  . Bipolar 1 disorder ([place]) 05/09/2015  . Seizures ([place]) 05/09/2015  . Hyperlipidemia 05/09/2015  . COPD (chronic obstructive pulmonary disease) ([place]) 05/09/2015  . Depression 05/09/2015  . Anxiety 05/09/2015  . General weakness 05/09/2015  . CAD (coronary artery disease) 05/09/2015  . 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  HTN (hypertension) 05/09/2015  . Chronic pain 05/09/2015  . Esophageal reflux disease 05/09/2015  . Other heart disorders in diseases classified elsewhere 05/09/2015  . Paraplegia ([place]) 05/09/2015    Past Surgical History:  Procedure Laterality Date  . CARDIAC SURGERY    . THYROID SURGERY     67 yrs old     OB History   None      Home Medications    Prior to Admission medications   Medication Sig Start Date End Date Taking? Authorizing Provider  apixaban (ELIQUIS) 5 MG TABS tablet Take 5 mg by mouth 2 (two) times daily.   Yes [provider]  aspirin EC 81 MG tablet Take 81 mg by mouth daily.   Yes [provider]  calcium-vitamin D (OSCAL WITH D) 500-200 MG-UNIT per tablet Take 1 tablet by mouth daily with breakfast.   Yes [provider]  cetirizine (ZYRTEC) 10 MG tablet Take 10 mg by mouth daily.   Yes [provider]  DULoxetine (CYMBALTA) 30 MG capsule Take 30 mg by mouth daily.    Yes [provider]  escitalopram (LEXAPRO) 20 MG tablet Take 20 mg by mouth daily.   Yes [provider]  esomeprazole (NEXIUM) 20 MG capsule Take 20 mg by mouth daily at 12 noon.   Yes [provider]  fluticasone (FLONASE) 50 MCG/ACT nasal spray Place 1 spray into both nostrils at bedtime.   Yes [provider]  furosemide (LASIX) 20 MG tablet Take 20 mg by mouth daily.    Yes [provider]  hydrOXYzine (ATARAX/VISTARIL) 25 MG tablet Take 25 mg by mouth daily as needed for itching.    Yes [provider]  ipratropium-albuterol (DUONEB) 0.5-2.5 (3) MG/3ML SOLN Take 3 mLs by nebulization 3 (three) times daily as needed.   Yes [provider]  isosorbide mononitrate (IMDUR) 30 MG 24 hr tablet Take 45 mg by mouth daily.   Yes [provider]  latanoprost (XALATAN) 0.005 % ophthalmic solution Place 1 drop into both eyes at bedtime. Reported on 01/17/2016   Yes [provider]    levothyroxine (SYNTHROID, LEVOTHROID) 300 MCG tablet Take 300 mcg by mouth daily before breakfast.    Yes [provider]  lidocaine (LIDODERM) 5 % Place 1 patch onto the skin daily as needed. Remove & Discard patch within 12 hours or as directed by MD   Yes [provider]  LORazepam (ATIVAN) 0.5 MG tablet Take 0.5 mg by mouth at bedtime.    Yes [provider]  Menthol-Methyl Salicylate (MUSCLE RUB) 10-15 % CREA Apply 1 application topically every evening. Applied to left leg and arm   Yes [provider]  metoprolol tartrate (LOPRESSOR) 25 MG tablet Take 1 tablet (25 mg total) by mouth 2 (two) times daily. 05/16/15  Yes BranchAlphonse Guild, MD  Multiple Vitamin (MULTIVITAMIN WITH MINERALS) TABS tablet Take 1 tablet by mouth daily.   Yes [provider]  nitroGLYCERIN (NITROSTAT) 0.4 MG SL tablet Place 0.4 mg under the tongue every 5 (five) minutes as needed for chest pain.   Yes [provider]  ondansetron (ZOFRAN) 4 MG tablet Take 4 mg by mouth every 4 (four) hours as needed for nausea or vomiting.    Yes [provider]  oxyCODONE-acetaminophen (PERCOCET/ROXICET) 5-325 MG tablet Take 1 tablet by mouth every 6 (six) hours as needed for moderate pain or severe pain.   Yes [provider]  phenytoin (DILANTIN) 200 MG ER capsule Take 200 mg by mouth 3 (three) times daily.   Yes [provider]  polyethylene glycol powder (GLYCOLAX/MIRALAX) powder Take 17 g by mouth daily.   Yes [provider]  pravastatin (PRAVACHOL) 80 MG tablet Take 80 mg by mouth daily.   Yes [provider]  pregabalin (LYRICA) 75 MG capsule Take 75 mg by mouth daily.   Yes [provider]  Propylene Glycol (SYSTANE BALANCE) 0.6 % SOLN Place 1 drop into both eyes 2 (two) times daily. Both eyes   Yes [provider]  senna (SENOKOT) 8.6 MG TABS tablet Take 1 tablet by mouth 2 (two) times daily.   Yes [provider]  tizanidine (ZANAFLEX) 2 MG capsule Take 2 mg by mouth every 6 (six) hours as needed for muscle spasms.   Yes [provider]  vitamin B-12 (CYANOCOBALAMIN) 1000 MCG tablet Take 1,000 mcg by mouth daily.   Yes [provider]    Family History Family History  Problem Relation Age of Onset  . Hypertension Other     Social History Social History   Tobacco Use  . Smoking status: Former Smoker    Packs/day: 0.50    Types: Cigarettes    Last attempt to quit: 05/16/2003    Years since quitting: 15.0  . Smokeless tobacco: Never Used  Substance Use Topics  . Alcohol use: No    Alcohol/week: 0.0 oz  . Drug  use: Not on file     Allergies   Latex   Review of Systems Review of Systems  Constitutional: Negative for appetite change and fatigue.  HENT: Negative for congestion, ear discharge and sinus pressure.   Eyes: Negative for discharge.  Respiratory: Negative for cough and shortness of breath.   Cardiovascular: Negative for chest pain.  Gastrointestinal: Negative for abdominal pain and diarrhea.  Genitourinary: Negative for frequency and hematuria.  Musculoskeletal: Negative for back pain.  Skin: Negative for rash.  Neurological: Positive for weakness. Negative for focal weakness, seizures and headaches.  Psychiatric/Behavioral: Negative for hallucinations.     Physical Exam Updated Vital Signs BP (!) 98/58   Pulse 99   Temp 99.8 F (37.7 C) (Oral)   Resp 19   Ht 5\' 6"  (1.676 m)   Wt 113.4 kg (250 lb)   SpO2 98%   BMI 40.35 kg/m   Physical Exam  Constitutional: She is oriented to person, place, and time. She appears well-developed.  HENT:  Head: Normocephalic.  Eyes: Conjunctivae and EOM are normal. No scleral icterus.  Neck: Neck supple. No thyromegaly present.  Cardiovascular: Normal rate and regular rhythm. Exam reveals no gallop and no friction rub.  No murmur heard. Pulmonary/Chest: No stridor. She has no wheezes. She has  no rales. She exhibits no tenderness.  Abdominal: She exhibits no distension. There is no tenderness. There is no rebound.  Genitourinary:  Genitourinary Comments: Rectal heme-negative  Musculoskeletal: Normal range of motion. She exhibits no edema.  Lymphadenopathy:    She has no cervical adenopathy.  Neurological: She is oriented to person, place, and time. She exhibits normal muscle tone. Coordination normal.  Skin: No rash noted. No erythema.  Psychiatric: She has a normal mood and affect. Her behavior is normal.     ED Treatments / Results  Labs (all labs ordered are listed, but only abnormal results are displayed) Labs Reviewed  CBC WITH DIFFERENTIAL/PLATELET - Abnormal; Notable for the following components:      Result Value   RBC 3.01 (*)    Hemoglobin 5.2 (*)    HCT 21.4 (*)    MCV 71.1 (*)    MCH 17.3 (*)    MCHC 24.3 (*)    RDW 20.1 (*)    All other components within normal limits  COMPREHENSIVE METABOLIC PANEL - Abnormal; Notable for the following components:   Glucose, Bld 110 (*)    Calcium 8.0 (*)    Total Protein 6.1 (*)    Albumin 2.7 (*)    AST 14 (*)    Alkaline Phosphatase 155 (*)    All other components within normal limits  URINALYSIS, ROUTINE W REFLEX MICROSCOPIC - Abnormal; Notable for the following components:   APPearance CLOUDY (*)    Leukocytes, UA LARGE (*)    WBC, UA >50 (*)    Bacteria, UA FEW (*)    All other components within normal limits  RETICULOCYTES - Abnormal; Notable for the following components:   Retic Ct Pct 3.8 (*)    RBC. 3.01 (*)    All other components within normal limits  URINE CULTURE  VITAMIN B12  FOLATE  IRON AND TIBC  FERRITIN  OCCULT BLOOD X 1 CARD TO LAB, STOOL  TYPE AND SCREEN  PREPARE RBC (CROSSMATCH)    EKG None  Radiology Dg Chest Portable 1 View  Result Date: 05/12/2018 CLINICAL DATA:  Intermittent chest pain over the last 2 weeks. EXAM: PORTABLE CHEST 1 VIEW COMPARISON:  03/23/2016  and previous  FINDINGS: Previous median sternotomy and CABG. Mild chronic cardiomegaly. Hiatal hernia as seen previously. Pulmonary vascularity is normal. Lungs are clear. No effusions. No acute bone finding. IMPRESSION: Previous CABG.  Hiatal hernia.  No active disease otherwise. Electronically Signed   By: Nelson Chimes M.D.   On: 05/12/2018 17:56    Procedures Procedures (including critical care time)  Medications Ordered in ED Medications  0.9 %  sodium chloride infusion (Manually program via Guardrails IV Fluids) (has no administration in time range)  sodium chloride 0.9 % bolus 500 mL (0 mLs Intravenous Stopped 05/12/18 1914)  cefTRIAXone (ROCEPHIN) 1 g in sodium chloride 0.9 % 100 mL IVPB (1 g Intravenous New Bag/Given 05/12/18 1937)   CRITICAL CARE Performed by: Milton Ferguson Total critical care time:40 minutes Critical care time was exclusive of separately billable procedures and treating other patients. Critical care was necessary to treat or prevent imminent or life-threatening deterioration. Critical care was time spent personally by me on the following activities: development of treatment plan with patient and/or surrogate as well as nursing, discussions with consultants, evaluation of patient's response to treatment, examination of patient, obtaining history from patient or surrogate, ordering and performing treatments and interventions, ordering and review of laboratory studies, ordering and review of radiographic studies, pulse oximetry and re-evaluation of patient's condition.   Initial Impression / Assessment and Plan / ED Course  I have reviewed the triage vital signs and the nursing notes.  Pertinent labs & imaging results that were available during my care of the patient were reviewed by me and considered in my medical decision making (see chart for details).   Patient with anemia and UTI.  She will be transfused and admitted to medicine    Final Clinical Impressions(s) / ED  Diagnoses   Final diagnoses:  Iron deficiency anemia due to chronic blood loss    ED Discharge Orders    None       Milton Ferguson, MD 05/12/18 2006

## 2018-05-12 NOTE — H&P (Addendum)
TRH H&P    Patient Demographics:    Karen Mccann, is a 67 y.o. female  MRN: [id number]  DOB - 09-16-1951  Admit Date - 05/12/2018  Referring MD/NP/PA: Dr. Roderic Palau  Outpatient Primary MD for the patient is [name], MD  Patient coming from: Skilled nursing facility   Chief complaint- anemia   HPI:    Karen Mccann  is a 67 y.o. female, with a history of strokeWith resultant left-sided hemiplegia, CAD status post CABG, hypothyroidism, hyperlipidemia, seizures was sent to the ED from skilled nursing facility with chief complaints of anemia.  Patient was found to be anemic on a routine lab work at the skilled facility and was found to have hemoglobin of 5.2.  Patient was sent to the ED for further evaluation.  She complains of intermittent chest pain and shortness of breath but no symptoms of passing out.  She does complain of dizziness and generalized weakness.  She denies increased frequency of urination but complains of dysuria.  In the ED patient was found to have abnormal UA.  3 units of PRBC have been ordered.  Also received 1 dose of ceftriaxone. She denies nausea vomiting or diarrhea. Denies vomiting blood.  No history of black stools. Patient is on Eliquis anticoagulation along with aspirin for history of stroke and CAD respectively    Review of systems:     All other systems reviewed and are negative.   With Past History of the following :    Past Medical History:  Diagnosis Date  . Bipolar 1 disorder, depressed ([place])   . COPD (chronic obstructive pulmonary disease) ([place])   . Depression   . Gastroesophageal reflux   . Heart failure ([place])   . Hemiparesis ([place])   . Hemiplegia ([place])   . Stroke ([place])   . Thyroid disease       Past Surgical History:  Procedure Laterality Date  . CARDIAC SURGERY    . THYROID SURGERY     67 yrs old      Social History:      Social History    Tobacco Use  . Smoking status: Former Smoker    Packs/day: 0.50    Types: Cigarettes    Last attempt to quit: 05/16/2003    Years since quitting: 15.0  . Smokeless tobacco: Never Used  Substance Use Topics  . Alcohol use: No    Alcohol/week: 0.0 oz       Family History :     Family History  Problem Relation Age of Onset  . Hypertension Other    Patient's mother died of cancer   Home Medications:   Prior to Admission medications   Medication Sig Start Date End Date Taking? Authorizing Provider  apixaban (ELIQUIS) 5 MG TABS tablet Take 5 mg by mouth 2 (two) times daily.   Yes [provider]  aspirin EC 81 MG tablet Take 81 mg by mouth daily.   Yes [provider]  calcium-vitamin D (OSCAL WITH D) 500-200 MG-UNIT per tablet Take 1 tablet by mouth daily  with breakfast.   Yes [provider]  cetirizine (ZYRTEC) 10 MG tablet Take 10 mg by mouth daily.   Yes [provider]  DULoxetine (CYMBALTA) 30 MG capsule Take 30 mg by mouth daily.    Yes [provider]  escitalopram (LEXAPRO) 20 MG tablet Take 20 mg by mouth daily.   Yes [provider]  esomeprazole (NEXIUM) 20 MG capsule Take 20 mg by mouth daily at 12 noon.   Yes [provider]  fluticasone (FLONASE) 50 MCG/ACT nasal spray Place 1 spray into both nostrils at bedtime.   Yes [provider]  furosemide (LASIX) 20 MG tablet Take 20 mg by mouth daily.    Yes [provider]  hydrOXYzine (ATARAX/VISTARIL) 25 MG tablet Take 25 mg by mouth daily as needed for itching.    Yes [provider]  ipratropium-albuterol (DUONEB) 0.5-2.5 (3) MG/3ML SOLN Take 3 mLs by nebulization 3 (three) times daily as needed.   Yes [provider]  isosorbide mononitrate (IMDUR) 30 MG 24 hr tablet Take 45 mg by mouth daily.   Yes [provider]  latanoprost (XALATAN) 0.005 % ophthalmic solution Place 1 drop into both eyes at bedtime. Reported  on 01/17/2016   Yes [provider]  levothyroxine (SYNTHROID, LEVOTHROID) 300 MCG tablet Take 300 mcg by mouth daily before breakfast.    Yes [provider]  lidocaine (LIDODERM) 5 % Place 1 patch onto the skin daily as needed. Remove & Discard patch within 12 hours or as directed by MD   Yes [provider]  LORazepam (ATIVAN) 0.5 MG tablet Take 0.5 mg by mouth at bedtime.    Yes [provider]  Menthol-Methyl Salicylate (MUSCLE RUB) 10-15 % CREA Apply 1 application topically every evening. Applied to left leg and arm   Yes [provider]  metoprolol tartrate (LOPRESSOR) 25 MG tablet Take 1 tablet (25 mg total) by mouth 2 (two) times daily. 05/16/15  Yes BranchAlphonse Guild, MD  Multiple Vitamin (MULTIVITAMIN WITH MINERALS) TABS tablet Take 1 tablet by mouth daily.   Yes [provider]  nitroGLYCERIN (NITROSTAT) 0.4 MG SL tablet Place 0.4 mg under the tongue every 5 (five) minutes as needed for chest pain.   Yes [provider]  ondansetron (ZOFRAN) 4 MG tablet Take 4 mg by mouth every 4 (four) hours as needed for nausea or vomiting.    Yes [provider]  oxyCODONE-acetaminophen (PERCOCET/ROXICET) 5-325 MG tablet Take 1 tablet by mouth every 6 (six) hours as needed for moderate pain or severe pain.   Yes [provider]  phenytoin (DILANTIN) 200 MG ER capsule Take 200 mg by mouth 3 (three) times daily.   Yes [provider]  polyethylene glycol powder (GLYCOLAX/MIRALAX) powder Take 17 g by mouth daily.   Yes [provider]  pravastatin (PRAVACHOL) 80 MG tablet Take 80 mg by mouth daily.   Yes [provider]  pregabalin (LYRICA) 75 MG capsule Take 75 mg by mouth daily.   Yes [provider]  Propylene Glycol (SYSTANE BALANCE) 0.6 % SOLN Place 1 drop into both eyes 2 (two) times daily. Both eyes   Yes [provider]  senna (SENOKOT) 8.6 MG TABS tablet Take 1 tablet by mouth  2 (two) times daily.   Yes [provider]  tizanidine (ZANAFLEX) 2 MG capsule Take 2 mg by mouth every 6 (six) hours as needed for muscle spasms.   Yes [provider]  vitamin B-12 (CYANOCOBALAMIN) 1000 MCG tablet Take 1,000 mcg by mouth daily.   Yes [provider]     Allergies:     Allergies  Allergen Reactions  . Latex Other (See Comments)    unknown     Physical Exam:   Vitals  Blood pressure (!) 98/58, pulse 99, temperature 99.8 F (37.7 C), temperature source Oral, resp. rate 19, height 5\' 6"  (1.676 m), weight 113.4 kg (250 lb), SpO2 98 %.  1.  General: Appears in no acute distress  2. Psychiatric:  Intact judgement and  insight, awake alert, oriented x 3.  3. Neurologic: Left-sided hemiplegia 4. Eyes :  anicteric sclerae, moist conjunctivae with no lid lag. PERRLA.  5. ENMT:  Oropharynx clear with moist mucous membranes and good dentition  6. Neck:  supple, no cervical lymphadenopathy appriciated, No thyromegaly  7. Respiratory : Normal respiratory effort, good air movement bilaterally,clear to  auscultation bilaterally  8. Cardiovascular : RRR, no gallops, rubs or murmurs, no leg edema  9. Gastrointestinal:  Positive bowel sounds, abdomen soft, non-tender to palpation,no hepatosplenomegaly, no rigidity or guarding       10. Skin:  No cyanosis, normal texture and turgor, no rash, lesions or ulcers  11.Musculoskeletal:  Good muscle tone,  joints appear normal , no effusions,  normal range of motion    Data Review:    CBC Recent Labs  Lab 05/12/18 1658  WBC 8.1  HGB 5.2*  HCT 21.4*  PLT 298  MCV 71.1*  MCH 17.3*  MCHC 24.3*  RDW 20.1*  LYMPHSABS 1.9  MONOABS 0.8  EOSABS 0.1  BASOSABS 0.0   ------------------------------------------------------------------------------------------------------------------  Chemistries  Recent Labs  Lab 05/12/18 1658  NA 142  K 3.8  CL 109  CO2 28  GLUCOSE 110*  BUN 12   CREATININE 0.52  CALCIUM 8.0*  AST 14*  ALT 15  ALKPHOS 155*  BILITOT 0.3   ------------------------------------------------------------------------------------------------------------------  ------------------------------------------------------------------------------------------------------------------ GFR: Estimated Creatinine Clearance: 89.5 mL/min (by C-G formula based on SCr of 0.52 mg/dL). Liver Function Tests: Recent Labs  Lab 05/12/18 1658  AST 14*  ALT 15  ALKPHOS 155*  BILITOT 0.3  PROT 6.1*  ALBUMIN 2.7*   No results for input(s): LIPASE, AMYLASE in the last 168 hours. No results for input(s): AMMONIA in the last 168 hours. Coagulation Profile: No results for input(s): INR, PROTIME in the last 168 hours. Cardiac Enzymes: No results for input(s): CKTOTAL, CKMB, CKMBINDEX, TROPONINI in the last 168 hours. BNP (last 3 results) No results for input(s): PROBNP in the last 8760 hours. HbA1C: No results for input(s): HGBA1C in the last 72 hours. CBG: No results for input(s): GLUCAP in the last 168 hours. Lipid Profile: No results for input(s): CHOL, HDL, LDLCALC, TRIG, CHOLHDL, LDLDIRECT in the last 72 hours. Thyroid Function Tests: No results for input(s): TSH, T4TOTAL, FREET4, T3FREE, THYROIDAB in the last 72 hours. Anemia Panel: Recent Labs    05/12/18 1658  RETICCTPCT 3.8*    --------------------------------------------------------------------------------------------------------------- Urine analysis:    Component Value Date/Time   COLORURINE YELLOW 05/12/2018 1751   APPEARANCEUR CLOUDY (A) 05/12/2018 1751   LABSPEC 1.017 05/12/2018 1751   PHURINE 7.0 05/12/2018 1751   GLUCOSEU NEGATIVE 05/12/2018 1751   HGBUR NEGATIVE 05/12/2018 1751   BILIRUBINUR NEGATIVE 05/12/2018 1751   KETONESUR NEGATIVE 05/12/2018 1751   PROTEINUR NEGATIVE 05/12/2018 1751   NITRITE NEGATIVE 05/12/2018 1751   LEUKOCYTESUR LARGE (A) 05/12/2018 1751      Imaging Results:  Dg Chest Portable 1 View  Result Date: 05/12/2018 CLINICAL DATA:  Intermittent chest pain over the last 2 weeks. EXAM: PORTABLE CHEST 1 VIEW COMPARISON:  03/23/2016 and previous FINDINGS: Previous median sternotomy and CABG. Mild chronic cardiomegaly. Hiatal hernia as seen previously. Pulmonary vascularity is normal. Lungs are clear. No effusions. No acute bone finding. IMPRESSION: Previous CABG.  Hiatal hernia.  No active disease otherwise. Electronically Signed   By: Nelson Chimes M.D.   On: 05/12/2018 17:56    My personal review of EKG: Rhythm NSR no ST changes,    Assessment & Plan:    Active Problems:   Hypothyroid   Seizures (HCC)   CAD (coronary artery disease)   Lower urinary tract infectious disease   Anemia   1. Anemia-unclear etiology, will obtain anemia panel.  Today hemoglobin is 5.2, previous hemoglobin was 10.9 on 01/17/2018. FOBT has been ordered.  3 units PRBC have been ordered in the ED.  Will give 1 dose of Lasix 20 mg IV after first unit PRBC.  Follow CBC in a.m.  Based on the report from anemia panel and FOBT consider gastro enterology consultation in a.m.  In the meantime I will hold Eliquis and aspirin. 2. Hypothyroidism-continue Synthroid 3. CAD, status post CABG-no chest pain at this time, hold aspirin due to anemia.  Continue nitroglycerin 4. History of seizures-continue Dilantin 5. History of stroke with left-sided hemiplegia-patient is on Eliquis, will hold Eliquis at this time due to above.  Stable.   6. UTI -patient has abnormal UA, started on ceftriaxone.  Follow urine culture results.     DVT Prophylaxis-    SCDs   AM Labs Ordered, also please review Full Orders  Family Communication: Admission, patients condition and plan of care including tests being ordered have been discussed with the patient  who indicate understanding and agree with the plan and Code Status.  Code Status:  DNR  Admission status: Inpatient   Time spent in minutes : 60  minutes   Oswald Hillock M.D on 05/12/2018 at 8:11 PM  Between 7am to 7pm - Pager - [phone]. After 7pm go to [email or web address] - password [facility]  [facility] - Office  [phone]

## 2018-05-13 ENCOUNTER — Other Ambulatory Visit: Payer: Self-pay

## 2018-05-13 DIAGNOSIS — D500 Iron deficiency anemia secondary to blood loss (chronic): Secondary | ICD-10-CM

## 2018-05-13 DIAGNOSIS — D649 Anemia, unspecified: Secondary | ICD-10-CM

## 2018-05-13 LAB — COMPREHENSIVE METABOLIC PANEL
ALK PHOS: 135 U/L — AB (ref 38–126)
ALT: 16 U/L (ref 0–44)
AST: 16 U/L (ref 15–41)
Albumin: 2.7 g/dL — ABNORMAL LOW (ref 3.5–5.0)
Anion gap: 4 — ABNORMAL LOW (ref 5–15)
BUN: 10 mg/dL (ref 8–23)
CALCIUM: 7.8 mg/dL — AB (ref 8.9–10.3)
CHLORIDE: 110 mmol/L (ref 98–111)
CO2: 27 mmol/L (ref 22–32)
CREATININE: 0.44 mg/dL (ref 0.44–1.00)
GFR calc Af Amer: >60 mL/min (ref >60)
GFR calc non Af Amer: >60 mL/min (ref >60)
Glucose, Bld: 95 mg/dL (ref 70–99)
Potassium: 3.8 mmol/L (ref 3.5–5.1)
SODIUM: 141 mmol/L (ref 135–145)
Total Bilirubin: 0.2 mg/dL — ABNORMAL LOW (ref 0.3–1.2)
Total Protein: 5.8 g/dL — ABNORMAL LOW (ref 6.5–8.1)

## 2018-05-13 LAB — CBC
HCT: 20.8 % — ABNORMAL LOW (ref 36.0–46.0)
Hemoglobin: 5.3 g/dL — CL (ref 12.0–15.0)
MCH: 18.1 pg — ABNORMAL LOW (ref 26.0–34.0)
MCHC: 25.5 g/dL — ABNORMAL LOW (ref 30.0–36.0)
MCV: 71 fL — AB (ref 78.0–100.0)
Platelets: 304 10*3/uL (ref 150–400)
RBC: 2.93 MIL/uL — AB (ref 3.87–5.11)
RDW: 20 % — ABNORMAL HIGH (ref 11.5–15.5)
WBC: 8.6 10*3/uL (ref 4.0–10.5)

## 2018-05-13 LAB — MRSA PCR SCREENING: MRSA by PCR: NEGATIVE

## 2018-05-13 LAB — FERRITIN: Ferritin: 4 ng/mL — ABNORMAL LOW (ref 11–307)

## 2018-05-13 LAB — IRON AND TIBC
Iron: 12 ug/dL — ABNORMAL LOW (ref 28–170)
Saturation Ratios: 4 % — ABNORMAL LOW (ref 10.4–31.8)
TIBC: 319 ug/dL (ref 250–450)
UIBC: 307 ug/dL

## 2018-05-13 LAB — FOLATE: Folate: 6.2 ng/mL (ref >5.9)

## 2018-05-13 LAB — PHENYTOIN LEVEL, TOTAL: Phenytoin Lvl: 11.5 ug/mL (ref 10.0–20.0)

## 2018-05-13 LAB — VITAMIN B12: VITAMIN B 12: 979 pg/mL — AB (ref 180–914)

## 2018-05-13 MED ORDER — NYSTATIN 100000 UNIT/GM EX POWD
Freq: Three times a day (TID) | CUTANEOUS | Status: DC
  Administered 2018-05-13 – 2018-05-15 (×6): via TOPICAL
  Filled 2018-05-13: qty 15

## 2018-05-13 MED ORDER — PEG 3350-KCL-NA BICARB-NACL 420 G PO SOLR
4000.0000 mL | Freq: Once | ORAL | Status: AC
  Administered 2018-05-14: 4000 mL via ORAL
  Filled 2018-05-13: qty 4000

## 2018-05-13 MED ORDER — CEFTRIAXONE SODIUM 1 G IJ SOLR
1.0000 g | INTRAMUSCULAR | Status: DC
  Administered 2018-05-13 – 2018-05-14 (×2): 1 g via INTRAVENOUS
  Filled 2018-05-13 (×2): qty 1
  Filled 2018-05-13 (×3): qty 10

## 2018-05-13 MED ORDER — BISACODYL 5 MG PO TBEC
10.0000 mg | DELAYED_RELEASE_TABLET | Freq: Once | ORAL | Status: AC
  Administered 2018-05-13: 10 mg via ORAL
  Filled 2018-05-13: qty 2

## 2018-05-13 MED ORDER — POLYETHYLENE GLYCOL 3350 17 G PO PACK: 17.0000 g | PACK | Freq: Every day | ORAL | Status: DC

## 2018-05-13 NOTE — Progress Notes (Signed)
PROGRESS NOTE    Karen Mccann  RJJ:[id number] DOB: 1951/08/18 DOA: 05/12/2018 PCP: [name], MD   Brief Narrative:   Karen Mccann  is a 67 y.o. female, with a history of stroke with resultant left-sided hemiplegia, CAD status post CABG, hypothyroidism, hyperlipidemia, seizures was sent to the ED from skilled nursing facility with chief complaints of anemia.  Patient was found to be anemic on a routine lab work at the skilled facility and was found to have hemoglobin of 5.2.  She had some mild associated symptomatology of chest pain and shortness of breath as well as dizziness and generalized weakness.  She was also noted to have an abnormal UA and has been started on ceftriaxone.  Her home Eliquis and aspirin have been withheld for now and she has been evaluated by GI this morning.  Assessment & Plan:   Active Problems:   Hypothyroid   Seizures (HCC)   CAD (coronary artery disease)   Lower urinary tract infectious disease   Anemia   1. Severe anemia-unclear etiology.  Monitor with anemia panel which is currently pending as well as stool FOBT which has been collected by GI this a.m.  PRBC transfusion has been initiated this morning.  Continue to withhold Eliquis and aspirin. 2. UTI.  Abnormal UA noted and patient started on ceftriaxone.  Follow urine culture results. 3. Hypothyroidism.  Continue Synthroid. 4. CAD status post CABG.  Hold aspirin due to severe anemia evaluation.  Continue nitroglycerin.  No chest pain noted. 5. History of seizures.  Continue Dilantin and check level. 6. History of stroke with left-sided hemiplegia.  Hold Eliquis due to above.   DVT prophylaxis:SCDs Code Status: DNR Family Communication: None at bedside Disposition Plan: GI evaluation for anemia; PRBC transfusion. UTI evaluation with empiric Rocephin.   Consultants:   GI  Procedures:   None  Antimicrobials:   Rocephin 7/30->   Subjective: Patient seen and evaluated today with no  new acute complaints or concerns. No acute concerns or events noted overnight.  Objective: Vitals:   05/13/18 0049 05/13/18 0425 05/13/18 0650 05/13/18 0700  BP: (!) 117/54 (!) 117/96 119/62 (!) 99/43  Pulse: (!) 119 (!) 106 92 97  Resp:  18    Temp: 99.5 F (37.5 C) 99.4 F (37.4 C) (!) 97.5 F (36.4 C) 98.8 F (37.1 C)  TempSrc:  Oral Oral Oral  SpO2: 96% 98% 100% 98%  Weight:      Height:        Intake/Output Summary (Last 24 hours) at 05/13/2018 0916 Last data filed at 05/13/2018 0526 Gross per 24 hour  Intake 550 ml  Output 200 ml  Net 350 ml   Filed Weights   05/12/18 1641 05/12/18 2229  Weight: 113.4 kg (250 lb) 116 kg (255 lb 11.7 oz)    Examination:  General exam: Appears calm and comfortable; pale; obese Respiratory system: Clear to auscultation. Respiratory effort normal. Cardiovascular system: S1 & S2 heard, RRR. No JVD, murmurs, rubs, gallops or clicks. No pedal edema. Gastrointestinal system: Abdomen is nondistended, soft and nontender. No organomegaly or masses felt. Normal bowel sounds heard. Central nervous system: Alert and oriented. No focal neurological deficits. Extremities: L sided hemiplegia Skin: No rashes, lesions or ulcers Psychiatry: Judgement and insight appear normal. Mood & affect appropriate.     Data Reviewed: I have personally reviewed following labs and imaging studies  CBC: Recent Labs  Lab 05/12/18 1658 05/13/18 0445  WBC 8.1 8.6  NEUTROABS 5.3  --  HGB 5.2* 5.3*  HCT 21.4* 20.8*  MCV 71.1* 71.0*  PLT 298 947   Basic Metabolic Panel: Recent Labs  Lab 05/12/18 1658 05/13/18 0445  NA 142 141  K 3.8 3.8  CL 109 110  CO2 28 27  GLUCOSE 110* 95  BUN 12 10  CREATININE 0.52 0.44  CALCIUM 8.0* 7.8*   GFR: Estimated Creatinine Clearance: 90.8 mL/min (by C-G formula based on SCr of 0.44 mg/dL). Liver Function Tests: Recent Labs  Lab 05/12/18 1658 05/13/18 0445  AST 14* 16  ALT 15 16  ALKPHOS 155* 135*  BILITOT  0.3 0.2*  PROT 6.1* 5.8*  ALBUMIN 2.7* 2.7*   No results for input(s): LIPASE, AMYLASE in the last 168 hours. No results for input(s): AMMONIA in the last 168 hours. Coagulation Profile: No results for input(s): INR, PROTIME in the last 168 hours. Cardiac Enzymes: No results for input(s): CKTOTAL, CKMB, CKMBINDEX, TROPONINI in the last 168 hours. BNP (last 3 results) No results for input(s): PROBNP in the last 8760 hours. HbA1C: No results for input(s): HGBA1C in the last 72 hours. CBG: No results for input(s): GLUCAP in the last 168 hours. Lipid Profile: No results for input(s): CHOL, HDL, LDLCALC, TRIG, CHOLHDL, LDLDIRECT in the last 72 hours. Thyroid Function Tests: No results for input(s): TSH, T4TOTAL, FREET4, T3FREE, THYROIDAB in the last 72 hours. Anemia Panel: Recent Labs    05/12/18 1658 05/12/18 1718  VITAMINB12  --  979*  FOLATE  --  6.2  FERRITIN  --  4*  TIBC  --  319  IRON  --  12*  RETICCTPCT 3.8*  --    Sepsis Labs: No results for input(s): PROCALCITON, LATICACIDVEN in the last 168 hours.  Recent Results (from the past 240 hour(s))  MRSA PCR Screening     Status: None   Collection Time: 05/13/18 12:42 AM  Result Value Ref Range Status   MRSA by PCR NEGATIVE NEGATIVE Final    Comment:        The GeneXpert MRSA Assay (FDA approved for NASAL specimens only), is one component of a comprehensive MRSA colonization surveillance program. It is not intended to diagnose MRSA infection nor to guide or monitor treatment for MRSA infections. Performed at [facility], [address] [place], [place] [address]          Radiology Studies: Dg Chest Portable 1 View  Result Date: 05/12/2018 CLINICAL DATA:  Intermittent chest pain over the last 2 weeks. EXAM: PORTABLE CHEST 1 VIEW COMPARISON:  03/23/2016 and previous FINDINGS: Previous median sternotomy and CABG. Mild chronic cardiomegaly. Hiatal hernia as seen previously. Pulmonary vascularity is normal.  Lungs are clear. No effusions. No acute bone finding. IMPRESSION: Previous CABG.  Hiatal hernia.  No active disease otherwise. Electronically Signed   By: Nelson Chimes M.D.   On: 05/12/2018 17:56        Scheduled Meds: . sodium chloride   Intravenous Once  . DULoxetine  30 mg Oral Daily  . escitalopram  20 mg Oral Daily  . furosemide  20 mg Intravenous Once  . furosemide  20 mg Oral Daily  . isosorbide mononitrate  45 mg Oral Daily  . latanoprost  1 drop Both Eyes QHS  . levothyroxine  300 mcg Oral QAC breakfast  . loratadine  10 mg Oral Daily  . LORazepam  0.5 mg Oral QHS  . metoprolol tartrate  25 mg Oral BID  . pantoprazole  40 mg Oral Daily  . phenytoin  200 mg Oral TID  . polyethylene glycol  17 g Oral Daily  . polyvinyl alcohol  1 drop Both Eyes BID  . pravastatin  80 mg Oral Daily  . pregabalin  75 mg Oral Daily  . vitamin B-12  1,000 mcg Oral Daily   Continuous Infusions: . sodium chloride       LOS: 1 day    Time spent: 30 minutes    Concetta Guion Darleen Crocker, DO [facility] Pager [phone]  If 7PM-7AM, please contact night-coverage [email or web address] Password [email or web address] 05/13/2018, 9:16 AM

## 2018-05-13 NOTE — Progress Notes (Signed)
Unit of blood returned to BB d/t alert that stated"unable to identify unit'.Blood bank to troubleshoot the issue.

## 2018-05-13 NOTE — Progress Notes (Signed)
Approached by night supervisor"you can hang the blood it has been verified by blood bank although charge was already told that this writer was uncomfortable of not following standard operating procedures regarding blood administration which includes any discrepancy,without question should be returned to blood bank.This Probation officer did offer to pick up blood even though this would have the fourth trip to [facility] bank without correction of the same.The [facility] stated that she would hand the blood whereas she was comfortable with doing so.

## 2018-05-13 NOTE — Progress Notes (Signed)
1 unit PRBC transfusion initiated per standard procedures, unit unable to scan. Verified unit per two RN's, unit #[id number] O +, pt verified name and date of birth.  Transfusion began at 06:50, VSS as follows: T-97.5 P-92 R-18 BP 119/62 . VS 15 mins   Transfusion beginning :T 98.9 P-97 R-18 BP 99/43 Pt now more awake, denies any discomfort.

## 2018-05-13 NOTE — Progress Notes (Signed)
Vitals checked prior to verifying blood with 2nd nurse,same alert "unable to identify unit' night supervisor already aware of first incident,charge nurse made aware of the same.Unit returned to blood bank per protocol.

## 2018-05-13 NOTE — Consult Note (Signed)
Reason for Consult: anemia Referring Physician: [name] is an 68 y.o. female.  HPI: Resident of [facility]. Admitted yesterday thru the ED after labs obtained at [facility] revealed a hemoglobin of 5.2. She was sent to the ED. Hx of  CVA with left sided paralysis.  Maintained on Eliquis. Hx of CABG in the past.  Hx of CHF No prn NSAIDS States she has not seen any blood in her stools. She does states she occasionally has SOB and chest intermittently. Usually has a BM x 1 a weeks. In 2017 Hemoglobin 13.7. Is receiving 1st unit of blood. 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 05/12/2018 Ferritin 4, Iron 12, TIBC 319, Saturation 4, UIBC 307;.  Retired. Worked at [employer] 1 child in good health. Divorced  CBC Latest Ref Rng & Units 05/13/2018 05/12/2018 01/17/2018  WBC 4.0 - 10.5 K/uL 8.6 8.1 -  Hemoglobin 12.0 - 15.0 g/dL 5.3(LL) 5.2(LL) 10.9(L)  Hematocrit 36.0 - 46.0 % 20.8(L) 21.4(L) 32.0(L)  Platelets 150 - 400 K/uL 304 298 -    Past Medical History:  Diagnosis Date  . Bipolar 1 disorder, depressed ([place])   . COPD (chronic obstructive pulmonary disease) ([place])   . Depression   . Gastroesophageal reflux   . Heart failure ([place])   . Hemiparesis ([place])   . Hemiplegia ([place])   . Stroke ([place])   . Thyroid disease     Past Surgical History:  Procedure Laterality Date  . CARDIAC SURGERY    . THYROID SURGERY     67 yrs old    Family History  Problem Relation Age of Onset  . Hypertension Other     Social History:  reports that she quit smoking about 15 years ago. Her smoking use included cigarettes. She smoked 0.50 packs per day. She has never used smokeless tobacco. She reports that she does not drink alcohol. Her drug history is not on file.  Allergies:  Allergies  Allergen Reactions  . Latex Other (See Comments)    unknown    Medications: I have reviewed the patient's current medications.  Results for orders placed or performed during the hospital encounter of 05/12/18 (from  the past 48 hour(s))  CBC with Differential/Platelet     Status: Abnormal   Collection Time: 05/12/18  4:58 PM  Result Value Ref Range   WBC 8.1 4.0 - 10.5 K/uL   RBC 3.01 (L) 3.87 - 5.11 MIL/uL   Hemoglobin 5.2 (LL) 12.0 - 15.0 g/dL    Comment: RESULT REPEATED AND VERIFIED CRITICAL RESULT CALLED TO, READ BACK BY AND VERIFIED WITH: MINTER,R AT 1757 ON 7.30.2019 BY ISLEY,B    HCT 21.4 (L) 36.0 - 46.0 %   MCV 71.1 (L) 78.0 - 100.0 fL   MCH 17.3 (L) 26.0 - 34.0 pg   MCHC 24.3 (L) 30.0 - 36.0 g/dL   RDW 20.1 (H) 11.5 - 15.5 %   Platelets 298 150 - 400 K/uL    Comment: SPECIMEN CHECKED FOR CLOTS PLATELET COUNT CONFIRMED BY SMEAR    Neutrophils Relative % 65 %   Neutro Abs 5.3 1.7 - 7.7 K/uL   Lymphocytes Relative 23 %   Lymphs Abs 1.9 0.7 - 4.0 K/uL   Monocytes Relative 10 %   Monocytes Absolute 0.8 0.1 - 1.0 K/uL   Eosinophils Relative 2 %   Eosinophils Absolute 0.1 0.0 - 0.7 K/uL   Basophils Relative 0 %   Basophils Absolute 0.0 0.0 - 0.1 K/uL    Comment: Performed at [facility], [address] [place] [address], [place], [place] [address]  Type and screen     Status: None (Preliminary result)   Collection Time: 05/12/18  4:58 PM  Result Value Ref Range   ABO/RH(D) O POS    Antibody Screen POS    Sample Expiration 05/15/2018    Antibody Identification ANTI c ANTI E    PT AG Type NEGATIVE FOR c ANTIGEN NEGATIVE FOR E ANTIGEN    Unit Number [id number]    Blood Component Type RED CELLS,LR    Unit division 00    Status of Unit ALLOCATED    Transfusion Status OK TO TRANSFUSE    Crossmatch Result COMPATIBLE    Donor AG Type NEGATIVE FOR c ANTIGEN NEGATIVE FOR E ANTIGEN    Unit Number [id number]    Blood Component Type RED CELLS,LR    Unit division 00    Status of Unit ALLOCATED    Transfusion Status OK TO TRANSFUSE    Crossmatch Result COMPATIBLE    Donor AG Type NEGATIVE FOR c ANTIGEN NEGATIVE FOR E ANTIGEN    Unit Number      [id number] Performed at [facility],  [place] [address] [place], [place] [address]    Blood Component Type      RBC LR PHER1 Performed at [facility], [place] [address] [place], [place] [address]    Unit division      00 Performed at [facility], [place] [address], [place], [place] [address]    Status of Unit      ISSUED Performed at [facility] [address], [place], [place] [address]    Transfusion Status OK TO TRANSFUSE    Crossmatch Result COMPATIBLE    Donor AG Type NEGATIVE FOR c ANTIGEN NEGATIVE FOR E ANTIGEN   Comprehensive metabolic panel     Status: Abnormal   Collection Time: 05/12/18  4:58 PM  Result Value Ref Range   Sodium 142 135 - 145 mmol/L   Potassium 3.8 3.5 - 5.1 mmol/L   Chloride 109 98 - 111 mmol/L   CO2 28 22 - 32 mmol/L   Glucose, Bld 110 (H) 70 - 99 mg/dL   BUN 12 8 - 23 mg/dL   Creatinine, Ser 0.52 0.44 - 1.00 mg/dL   Calcium 8.0 (L) 8.9 - 10.3 mg/dL   Total Protein 6.1 (L) 6.5 - 8.1 g/dL   Albumin 2.7 (L) 3.5 - 5.0 g/dL   AST 14 (L) 15 - 41 U/L   ALT 15 0 - 44 U/L   Alkaline Phosphatase 155 (H) 38 - 126 U/L   Total Bilirubin 0.3 0.3 - 1.2 mg/dL   GFR calc non Af Amer >60 >60 mL/min   GFR calc Af Amer >60 >60 mL/min    Comment: (NOTE) The eGFR has been calculated using the CKD EPI equation. This calculation has not been validated in all clinical situations. eGFR's persistently <60 mL/min signify possible Chronic Kidney Disease.    Anion gap 5 5 - 15    Comment: Performed at [facility], [address] [place], [place] [address]  Reticulocytes     Status: Abnormal   Collection Time: 05/12/18  4:58 PM  Result Value Ref Range   Retic Ct Pct 3.8 (H) 0.4 - 3.1 %   RBC. 3.01 (L) 3.87 - 5.11 MIL/uL   Retic Count, Absolute 114.4 19.0 - 186.0 K/uL    Comment: Performed at [facility], [address], [place],  [address]  Vitamin B12     Status: Abnormal   Collection Time: 05/12/18  5:18 PM  Result Value Ref Range   Vitamin B-12 979 (H) 180 - 914 pg/mL    Comment:  (NOTE) This assay is not validated for testing neonatal or myeloproliferative syndrome specimens for Vitamin B12 levels. Performed at [facility], [place] [address], [place], [place] [address]   Folate     Status: None   Collection Time: 05/12/18  5:18 PM  Result Value Ref Range   Folate 6.2 >5.9 ng/mL    Comment: Performed at [facility] [address], [place], [place] [address]  Iron and TIBC     Status: Abnormal   Collection Time: 05/12/18  5:18 PM  Result Value Ref Range   Iron 12 (L) 28 - 170 ug/dL   TIBC 319 250 - 450 ug/dL   Saturation Ratios 4 (L) 10.4 - 31.8 %   UIBC 307 ug/dL    Comment: Performed at [facility], [place] [address], [place], [place] [address]  Ferritin     Status: Abnormal   Collection Time: 05/12/18  5:18 PM  Result Value Ref Range   Ferritin 4 (L) 11 - 307 ng/mL    Comment: Performed at [facility], [place] [address], [place], [place] [address]  Urinalysis, Routine w reflex microscopic     Status: Abnormal   Collection Time: 05/12/18  5:51 PM  Result Value Ref Range   Color, Urine YELLOW YELLOW   APPearance CLOUDY (A) CLEAR   Specific Gravity, Urine 1.017 1.005 - 1.030   pH 7.0 5.0 - 8.0   Glucose, UA NEGATIVE NEGATIVE mg/dL   Hgb urine dipstick NEGATIVE NEGATIVE   Bilirubin Urine NEGATIVE NEGATIVE   Ketones, ur NEGATIVE NEGATIVE mg/dL   Protein, ur NEGATIVE NEGATIVE mg/dL   Nitrite NEGATIVE NEGATIVE   Leukocytes, UA LARGE (A) NEGATIVE   RBC / HPF 11-20 0 - 5 RBC/hpf   WBC, UA >50 (H) 0 - 5 WBC/hpf   Bacteria, UA FEW (A) NONE SEEN   Squamous Epithelial / LPF 0-5 0 - 5   WBC Clumps PRESENT    Mucus PRESENT     Comment: Performed at [facility], [address] [place], [place] [address]  Prepare RBC     Status: None   Collection Time: 05/12/18  6:09 PM  Result Value Ref Range   Order Confirmation      ORDER PROCESSED BY BLOOD BANK Performed at [facility], [address] [place],  [address]   MRSA PCR  Screening     Status: None   Collection Time: 05/13/18 12:42 AM  Result Value Ref Range   MRSA by PCR NEGATIVE NEGATIVE    Comment:        The GeneXpert MRSA Assay (FDA approved for NASAL specimens only), is one component of a comprehensive MRSA colonization surveillance program. It is not intended to diagnose MRSA infection nor to guide or monitor treatment for MRSA infections. Performed at [facility], [address], [place],  [address]   CBC     Status: Abnormal   Collection Time: 05/13/18  4:45 AM  Result Value Ref Range   WBC 8.6 4.0 - 10.5 K/uL   RBC 2.93 (L) 3.87 - 5.11 MIL/uL   Hemoglobin 5.3 (LL) 12.0 - 15.0 g/dL    Comment: RESULT REPEATED AND VERIFIED CRITICAL RESULT CALLED TO, READ BACK BY AND VERIFIED WITH: GADDY,C AT 0600 BY  HUFFINES,S ON 05/13/18.    HCT 20.8 (L) 36.0 - 46.0 %   MCV 71.0 (L) 78.0 - 100.0 fL   MCH 18.1 (L) 26.0 - 34.0 pg   MCHC 25.5 (L) 30.0 - 36.0 g/dL   RDW 20.0 (H) 11.5 - 15.5 %   Platelets 304 150 - 400 K/uL    Comment: Performed at [facility], [address] [place], [place] [address]  Comprehensive metabolic panel     Status: Abnormal   Collection Time: 05/13/18  4:45 AM  Result Value Ref Range   Sodium 141 135 - 145 mmol/L   Potassium 3.8 3.5 - 5.1 mmol/L   Chloride 110 98 - 111 mmol/L   CO2 27 22 - 32 mmol/L   Glucose, Bld 95 70 - 99 mg/dL   BUN 10 8 - 23 mg/dL   Creatinine, Ser 0.44 0.44 - 1.00 mg/dL   Calcium 7.8 (L) 8.9 - 10.3 mg/dL   Total Protein 5.8 (L) 6.5 - 8.1 g/dL   Albumin 2.7 (L) 3.5 - 5.0 g/dL   AST 16 15 - 41 U/L   ALT 16 0 - 44 U/L   Alkaline Phosphatase 135 (H) 38 - 126 U/L   Total Bilirubin 0.2 (L) 0.3 - 1.2 mg/dL   GFR calc non Af Amer >60 >60 mL/min   GFR calc Af Amer >60 >60 mL/min    Comment: (NOTE) The eGFR has been calculated using the CKD EPI equation. This calculation has not been validated in all clinical situations. eGFR's persistently <60 mL/min signify possible Chronic  Kidney Disease.    Anion gap 4 (L) 5 - 15    Comment: Performed at [facility], [address] [place], [place] [address]    Dg Chest Portable 1 View  Result Date: 05/12/2018 CLINICAL DATA:  Intermittent chest pain over the last 2 weeks. EXAM: PORTABLE CHEST 1 VIEW COMPARISON:  03/23/2016 and previous FINDINGS: Previous median sternotomy and CABG. Mild chronic cardiomegaly. Hiatal hernia as seen previously. Pulmonary vascularity is normal. Lungs are clear. No effusions. No acute bone finding. IMPRESSION: Previous CABG.  Hiatal hernia.  No active disease otherwise. Electronically Signed   By: Nelson Chimes M.D.   On: 05/12/2018 17:56    ROS Blood pressure (!) 99/43, pulse 97, temperature 98.8 F (37.1 C), temperature source Oral, resp. rate 18, height _0  (1.676 m), weight 255 lb 11.7 oz (116 kg), SpO2 98 %. Physical Exam Alert and oriented. Skin warm and dry. Oral mucosa is moist.   . Sclera anicteric, conjunctivae is pink. Thyroid not enlarged. No cervical lymphadenopathy. Lungs clear. Heart regular rate and rhythm.  Abdomen is soft. Bowel sounds are positive. No hepatomegaly. No abdominal masses felt. No tenderness.  2+ edema to lower extremities. Stool brown and guaiac negative.  Assessment/Plan: IDA. Profound anemia. 1st unit of PRBCs hanging. Will discuss with Dr. Laural Golden,. Stool brown and guaiac negative. She has never undergone a colonoscopy in the past.  Butch Penny 05/13/2018, 9:43 AM

## 2018-05-13 NOTE — Progress Notes (Addendum)
UNMATCHED BLOOD PRODUCT NOTE  Compare the patient ID on the blood tag to the patient ID on the hospital armband and Blood Bank armband. Then confirm the unit number on the blood tag matches the unit number on the blood product.  If a discrepancy is discovered return the product to blood bank immediately.   Blood Product Type: Packed Red Blood Cells  Unit #: (Found on blood product bag, begins with W) [id number]  Product Code #: (Found on blood product bag, begins with E) [id number]   Start Time: 0272 Unit verified with blood bank and Mable Fill, RN completed second independent RN verification prior to starting transfusion.   Starting Rate: 120 ml/hr  Rate increase/decreased  (if applicable):   536   Ml/hr (continued rate after 15 min VS obtained at 1206.   Rate changed time (if applicable): n/a   Stop Time: 6440   HKV Other Documentation should be documented within the Blood Admin Flowsheet per policy.

## 2018-05-13 NOTE — NC FL2 (Signed)
[place] MEDICAID FL2 LEVEL OF CARE SCREENING TOOL     IDENTIFICATION  Patient Name: Karen Mccann Birthdate: 09-24-1951 Sex: female Admission Date (Current Location): 05/12/2018  [facility] and [place] Number:  [employer] and Address:  [place] [address] [place]      Provider Number: [id number]  Attending Physician Name and Address:  Rodena Goldmann, DO  Relative Name and Phone Number:       Current Level of Care: Hospital Recommended Level of Care: [place] Prior Approval Number:    Date Approved/Denied:   PASRR Number:    Discharge Plan: SNF    Current Diagnoses: Patient Active Problem List   Diagnosis Date Noted  . Anemia 05/12/2018  . Sepsis ([place]) 03/23/2016  . 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 Lower urinary tract infectious disease 09/17/2015  . Stroke ([place]) 05/09/2015  . Hypothyroid 05/09/2015  . Bipolar 1 disorder ([place]) 05/09/2015  . Seizures ([place]) 05/09/2015  . Hyperlipidemia 05/09/2015  . COPD (chronic obstructive pulmonary disease) ([place]) 05/09/2015  . Depression 05/09/2015  . Anxiety 05/09/2015  . General weakness 05/09/2015  . CAD (coronary artery disease) 05/09/2015  . 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 HTN (hypertension) 05/09/2015  . Chronic pain 05/09/2015  . Esophageal reflux disease 05/09/2015  . Other heart disorders in diseases classified elsewhere 05/09/2015  . Paraplegia ([place]) 05/09/2015    Orientation RESPIRATION BLADDER Height & Weight     Self, Time, Situation, Place  Normal Incontinent Weight: 255 lb 11.7 oz (116 kg) Height:  5\' 6"  (167.6 cm)  BEHAVIORAL SYMPTOMS/MOOD NEUROLOGICAL BOWEL NUTRITION STATUS      Incontinent Diet(see discharge summary)  AMBULATORY STATUS COMMUNICATION OF NEEDS Skin   Total Care Verbally Normal                       Personal Care Assistance Level of Assistance  Feeding, Dressing, Bathing Bathing Assistance: Maximum  assistance Feeding assistance: Independent Dressing Assistance: Maximum assistance     Functional Limitations Info  Sight, Hearing, Speech Sight Info: Adequate Hearing Info: Adequate Speech Info: Adequate    SPECIAL CARE FACTORS FREQUENCY                       Contractures Contractures Info: Not present    Additional Factors Info  Code Status, Allergies, Psychotropic, Isolation Precautions Code Status Info: DNR Allergies Info: Latex Psychotropic Info: Lexapro, Ativan   Isolation Precautions Info: 4.6.2019 ESBL E Coli in urine      Current Medications (05/13/2018):  This is the current hospital active medication list Current Facility-Administered Medications  Medication Dose Route Frequency Provider Last Rate Last Dose  . 0.9 %  sodium chloride infusion (Manually program via Guardrails IV Fluids)   Intravenous Once [place], Marge Duncans, MD      . 0.9 %  sodium chloride infusion   Intravenous Continuous Darrick Meigs, Marge Duncans, MD      . acetaminophen (TYLENOL) tablet 650 mg  650 mg Oral Q6H PRN Oswald Hillock, MD   650 mg at 05/12/18 2328   Or  . acetaminophen (TYLENOL) suppository 650 mg  650 mg Rectal Q6H PRN Oswald Hillock, MD      . cefTRIAXone (ROCEPHIN) 1 g in sodium chloride 0.9 % 100 mL IVPB  1 g Intravenous Q24H Shah, Pratik D, DO      . DULoxetine (CYMBALTA) DR capsule 30 mg  30 mg Oral Daily  Oswald Hillock, MD   30 mg at 05/13/18 1341  . escitalopram (LEXAPRO) tablet 20 mg  20 mg Oral Daily Oswald Hillock, MD   20 mg at 05/13/18 1342  . furosemide (LASIX) tablet 20 mg  20 mg Oral Daily Oswald Hillock, MD   20 mg at 05/13/18 1342  . hydrOXYzine (ATARAX/VISTARIL) tablet 25 mg  25 mg Oral Daily PRN Oswald Hillock, MD      . ipratropium-albuterol (DUONEB) 0.5-2.5 (3) MG/3ML nebulizer solution 3 mL  3 mL Nebulization Q6H PRN Oswald Hillock, MD      . isosorbide mononitrate (IMDUR) 24 hr tablet 45 mg  45 mg Oral Daily Oswald Hillock, MD   45 mg at 05/13/18 1340  . latanoprost (XALATAN)  0.005 % ophthalmic solution 1 drop  1 drop Both Eyes QHS [place], Marge Duncans, MD      . levothyroxine (SYNTHROID, LEVOTHROID) tablet 300 mcg  300 mcg Oral QAC breakfast Oswald Hillock, MD      . loratadine (CLARITIN) tablet 10 mg  10 mg Oral Daily Oswald Hillock, MD   10 mg at 05/13/18 1342  . LORazepam (ATIVAN) tablet 0.5 mg  0.5 mg Oral QHS Oswald Hillock, MD   0.5 mg at 05/12/18 2330  . metoprolol tartrate (LOPRESSOR) tablet 25 mg  25 mg Oral BID Oswald Hillock, MD   25 mg at 05/13/18 1342  . ondansetron (ZOFRAN) tablet 4 mg  4 mg Oral Q6H PRN Oswald Hillock, MD       Or  . ondansetron [facility]) injection 4 mg  4 mg Intravenous Q6H PRN Oswald Hillock, MD   4 mg at 05/12/18 2327  . oxyCODONE-acetaminophen (PERCOCET/ROXICET) 5-325 MG per tablet 1 tablet  1 tablet Oral Q6H PRN Oswald Hillock, MD   1 tablet at 05/12/18 2329  . pantoprazole (PROTONIX) EC tablet 40 mg  40 mg Oral Daily Oswald Hillock, MD   40 mg at 05/13/18 1343  . phenytoin (DILANTIN) ER capsule 200 mg  200 mg Oral TID Oswald Hillock, MD   200 mg at 05/13/18 1341  . polyethylene glycol (MIRALAX / GLYCOLAX) packet 17 g  17 g Oral Daily [place], Marge Duncans, MD      . polyvinyl alcohol (LIQUIFILM TEARS) 1.4 % ophthalmic solution 1 drop  1 drop Both Eyes BID Oswald Hillock, MD   1 drop at 05/13/18 1343  . pravastatin (PRAVACHOL) tablet 80 mg  80 mg Oral Daily Oswald Hillock, MD   80 mg at 05/13/18 1341  . pregabalin (LYRICA) capsule 75 mg  75 mg Oral Daily Oswald Hillock, MD   75 mg at 05/13/18 1342  . tiZANidine (ZANAFLEX) tablet 2 mg  2 mg Oral Q6H PRN Oswald Hillock, MD   2 mg at 05/13/18 0557  . vitamin B-12 (CYANOCOBALAMIN) tablet 1,000 mcg  1,000 mcg Oral Daily Oswald Hillock, MD   1,000 mcg at 05/13/18 1342     Discharge Medications: Please see discharge summary for a list of discharge medications.  Relevant Imaging Results:  Relevant Lab Results:   Additional Information    Tacha Manni, Clydene Pugh, LCSW

## 2018-05-13 NOTE — Progress Notes (Signed)
Received call from [facility] stating that blood had arrived from [facility] was delayed d/t patient having antibodies.

## 2018-05-13 NOTE — Clinical Social Work Note (Signed)
Clinical Social Work Assessment  Patient Details  Name: Karen Mccann MRN: [id number] Date of Birth: 24-Apr-1951  Date of referral:  05/13/18               Reason for consult:  Other (Comment Required)(Patient is from [facility])                Permission sought to share information with:    Permission granted to share information::     Name::        Agency::     Relationship::     Contact Information:     Housing/Transportation Living arrangements for the past 2 months:  [place] of Information:  Patient Patient Interpreter Needed:  None Criminal Activity/Legal Involvement Pertinent to Current Situation/Hospitalization:  No - Comment as needed Significant Relationships:  Adult Children Lives with:  Facility Resident Do you feel safe going back to the place where you live?  Yes Need for family participation in patient care:  Yes (Comment)  Care giving concerns:  None identified, facility resident.    Social Worker assessment / plan:  Patient has been at [facility] for more than a decade. She states that she is mainly bed bound and seldom gets up to a wheelchair.  Patient states that she feeds herself. Staff completes her ADLs. She is incontinent. She plans on returning to the facility at discharge.   Employment status:  Disabled (Comment on whether or not currently receiving Disability) Insurance information:  Managed Medicare PT Recommendations:  Not assessed at this time Information / Referral to community resources:     Patient/Family's Response to care:  Patient is agreeable to return to the facility as a long term resident.   Patient/Family's Understanding of and Emotional Response to Diagnosis, Current Treatment, and Prognosis:  Patient understands her diagnosis, treatment and prognosis.   Emotional Assessment Appearance:  Appears stated age Attitude/Demeanor/Rapport:    Affect (typically observed):  Accepting, Calm Orientation:  Oriented to Situation,  Oriented to  Time, Oriented to Place, Oriented to Self Alcohol / Substance use:  Not Applicable Psych involvement (Current and /or in the community):     Discharge Needs  Concerns to be addressed:  Other (Comment Required(Return to facility) Readmission within the last 30 days:  No Current discharge risk:  None Barriers to Discharge:  No Barriers Identified   Ihor Gully, LCSW 05/13/2018, 1:05 PM

## 2018-05-13 NOTE — Progress Notes (Addendum)
UNMATCHED BLOOD PRODUCT NOTE  Compare the patient ID on the blood tag to the patient ID on the hospital armband and Blood Bank armband. Then confirm the unit number on the blood tag matches the unit number on the blood product.  If a discrepancy is discovered return the product to blood bank immediately.   Blood Product Type: Packed Red Blood Cells  Unit #: (Found on blood product bag, begins with W) [id number]  Product Code #: (Found on blood product bag, begins with E) [id number]   Start Time: 3329 Third unit of PRBCs to transfuse. Verified unit with blood bank and Weyman Pedro, RN completed second RN verification at bedside. Administered as ordered.   Starting Rate: 120 ml/hr  Rate increase/decreased  (if applicable):   518   Ml/hr (continued at same rate)  Rate changed time (if applicable):    Stop Time: 8416   All Other Documentation should be documented within the Blood Admin Flowsheet per policy.

## 2018-05-13 NOTE — Progress Notes (Signed)
First unit of PRBCs completed at 1020. MD aware of delay in starting blood this am due to antibodies and scanning issue per night shift RN report. Discussed with Dr. Manuella Ghazi this am. Stated okay. Donavan Foil, RN

## 2018-05-13 NOTE — Progress Notes (Signed)
Received return call from MD regarding hgb 5.3 @ present.advised that blood was ready @ 0400 but carenurse unable to transfuse d/t issues with computer stating'unable to identify unit number".call

## 2018-05-14 ENCOUNTER — Inpatient Hospital Stay (HOSPITAL_COMMUNITY): Payer: Medicare Other | Admitting: Anesthesiology

## 2018-05-14 ENCOUNTER — Encounter (HOSPITAL_COMMUNITY): Payer: Self-pay | Admitting: Anesthesiology

## 2018-05-14 ENCOUNTER — Encounter (HOSPITAL_COMMUNITY)
Admission: EM | Disposition: A | Discharge status: Skilled Nursing Facility | Payer: Self-pay | Source: Home / Self Care | Attending: Internal Medicine

## 2018-05-14 ENCOUNTER — Other Ambulatory Visit: Payer: Self-pay

## 2018-05-14 DIAGNOSIS — N390 Urinary tract infection, site not specified: Secondary | ICD-10-CM

## 2018-05-14 DIAGNOSIS — D120 Benign neoplasm of cecum: Secondary | ICD-10-CM

## 2018-05-14 DIAGNOSIS — K6289 Other specified diseases of anus and rectum: Secondary | ICD-10-CM

## 2018-05-14 DIAGNOSIS — K3189 Other diseases of stomach and duodenum: Secondary | ICD-10-CM

## 2018-05-14 DIAGNOSIS — K449 Diaphragmatic hernia without obstruction or gangrene: Secondary | ICD-10-CM

## 2018-05-14 DIAGNOSIS — E038 Other specified hypothyroidism: Secondary | ICD-10-CM

## 2018-05-14 HISTORY — PX: COLONOSCOPY WITH PROPOFOL: SHX5780

## 2018-05-14 HISTORY — PX: ESOPHAGOGASTRODUODENOSCOPY (EGD) WITH PROPOFOL: SHX5813

## 2018-05-14 HISTORY — PX: POLYPECTOMY: SHX5525

## 2018-05-14 LAB — BPAM RBC
BLOOD PRODUCT EXPIRATION DATE: 201909042359
BLOOD PRODUCT EXPIRATION DATE: 201909042359
BLOOD PRODUCT EXPIRATION DATE: 201909042359
ISSUE DATE / TIME: 201907311519
ISSUE DATE / TIME: 201907311138
ISSUE DATE / TIME: 201907311138
UNIT TYPE AND RH: 5100
UNIT TYPE AND RH: 5100
Unit Type and Rh: 5100

## 2018-05-14 LAB — TYPE AND SCREEN
ABO/RH(D): O POS
Antibody Screen: POSITIVE
DONOR AG TYPE: NEGATIVE
DONOR AG TYPE: NEGATIVE
Donor AG Type: NEGATIVE
PT AG Type: NEGATIVE
UNIT DIVISION: 0
UNIT DIVISION: 0
Unit division: 0

## 2018-05-14 LAB — CBC
HEMATOCRIT: 31.7 % — AB (ref 36.0–46.0)
Hemoglobin: 8.9 g/dL — ABNORMAL LOW (ref 12.0–15.0)
MCH: 21.6 pg — ABNORMAL LOW (ref 26.0–34.0)
MCHC: 28.1 g/dL — ABNORMAL LOW (ref 30.0–36.0)
MCV: 76.9 fL — ABNORMAL LOW (ref 78.0–100.0)
Platelets: 258 10*3/uL (ref 150–400)
RBC: 4.12 MIL/uL (ref 3.87–5.11)
RDW: 21.6 % — AB (ref 11.5–15.5)
WBC: 8.2 10*3/uL (ref 4.0–10.5)

## 2018-05-14 LAB — BASIC METABOLIC PANEL
ANION GAP: 5 (ref 5–15)
BUN: 9 mg/dL (ref 8–23)
CALCIUM: 8.3 mg/dL — AB (ref 8.9–10.3)
CO2: 30 mmol/L (ref 22–32)
Chloride: 107 mmol/L (ref 98–111)
Creatinine, Ser: 0.54 mg/dL (ref 0.44–1.00)
GFR calc Af Amer: >60 mL/min (ref >60)
Glucose, Bld: 94 mg/dL (ref 70–99)
Potassium: 3.8 mmol/L (ref 3.5–5.1)
Sodium: 142 mmol/L (ref 135–145)

## 2018-05-14 LAB — HIV ANTIBODY (ROUTINE TESTING W REFLEX): HIV Screen 4th Generation wRfx: NONREACTIVE

## 2018-05-14 SURGERY — ESOPHAGOGASTRODUODENOSCOPY (EGD) WITH PROPOFOL
Anesthesia: Monitor Anesthesia Care

## 2018-05-14 MED ORDER — SODIUM CHLORIDE 0.9 % IV SOLN: INTRAVENOUS | Status: DC

## 2018-05-14 MED ORDER — HYDROMORPHONE HCL 1 MG/ML IJ SOLN: 0.2500 mg | INTRAMUSCULAR | Status: DC | PRN

## 2018-05-14 MED ORDER — HYDROCODONE-ACETAMINOPHEN 7.5-325 MG PO TABS: 1.0000 | ORAL_TABLET | Freq: Once | ORAL | Status: DC | PRN

## 2018-05-14 MED ORDER — FERROUS SULFATE 325 (65 FE) MG PO TABS
325.0000 mg | ORAL_TABLET | Freq: Every day | ORAL | Status: DC
  Administered 2018-05-15: 325 mg via ORAL
  Filled 2018-05-14: qty 1

## 2018-05-14 MED ORDER — STERILE WATER FOR IRRIGATION IR SOLN
Status: DC | PRN
  Administered 2018-05-14: 200 mL

## 2018-05-14 MED ORDER — PROMETHAZINE HCL 25 MG/ML IJ SOLN: 6.2500 mg | INTRAMUSCULAR | Status: DC | PRN

## 2018-05-14 MED ORDER — FERUMOXYTOL INJECTION 510 MG/17 ML
510.0000 mg | Freq: Once | INTRAVENOUS | Status: AC
  Administered 2018-05-14: 510 mg via INTRAVENOUS
  Filled 2018-05-14: qty 17

## 2018-05-14 MED ORDER — PROPOFOL 10 MG/ML IV BOLUS
INTRAVENOUS | Status: DC | PRN
  Administered 2018-05-14: 30 mg via INTRAVENOUS
  Administered 2018-05-14 (×3): 20 mg via INTRAVENOUS

## 2018-05-14 MED ORDER — MEPERIDINE HCL 100 MG/ML IJ SOLN: 6.2500 mg | INTRAMUSCULAR | Status: DC | PRN

## 2018-05-14 MED ORDER — LACTATED RINGERS IV SOLN: INTRAVENOUS | Status: DC

## 2018-05-14 MED ORDER — LACTATED RINGERS IV SOLN
INTRAVENOUS | Status: DC
  Administered 2018-05-14: 14:00:00 via INTRAVENOUS

## 2018-05-14 MED ORDER — MAGNESIUM CITRATE PO SOLN
1.0000 | Freq: Once | ORAL | Status: AC
  Administered 2018-05-14: 1 via ORAL
  Filled 2018-05-14: qty 296

## 2018-05-14 NOTE — Transfer of Care (Signed)
Immediate Anesthesia Transfer of Care Note  Patient: Karen Mccann  Procedure(s) Performed: ESOPHAGOGASTRODUODENOSCOPY (EGD) WITH PROPOFOL (N/A ) COLONOSCOPY WITH PROPOFOL (N/A ) POLYPECTOMY  Patient Location: PACU  Anesthesia Type:MAC  Level of Consciousness: awake, alert  and patient cooperative  Airway & Oxygen Therapy: Patient Spontanous Breathing  Post-op Assessment: Report given to RN and Post -op Vital signs reviewed and stable  Post vital signs: Reviewed and stable  Last Vitals:  Vitals Value Taken Time  BP 113/61 05/14/2018  2:52 PM  Temp    Pulse 87 05/14/2018  2:55 PM  Resp 17 05/14/2018  2:55 PM  SpO2 88 % 05/14/2018  2:55 PM  Vitals shown include unvalidated device data.  Last Pain:  Vitals:   05/14/18 1441  TempSrc:   PainSc: 0-No pain      Patients Stated Pain Goal: 9 (41/93/79 0240)  Complications: No apparent anesthesia complications

## 2018-05-14 NOTE — Progress Notes (Addendum)
Patient was not able to drink GoLYTELY. She was able to drink 10 ounces of mag citrate. Bowels are moving. She is not passing melanic stool or blood per rectum. Hemoglobin is up to 8.9 after 2 units of PRBCs. She remains hemodynamically stable. Since her aughter is not here yet. Is competent to give consent for these procedures. Patient to undergo EGD and colonoscopy under monitored anesthesia care later today.

## 2018-05-14 NOTE — Anesthesia Postprocedure Evaluation (Signed)
Anesthesia Post Note  Patient: Karen Mccann  Procedure(s) Performed: ESOPHAGOGASTRODUODENOSCOPY (EGD) WITH PROPOFOL (N/A ) COLONOSCOPY WITH PROPOFOL (N/A ) POLYPECTOMY  Patient location during evaluation: PACU Anesthesia Type: MAC Level of consciousness: awake and alert and patient cooperative Pain management: satisfactory to patient Vital Signs Assessment: post-procedure vital signs reviewed and stable Respiratory status: spontaneous breathing Cardiovascular status: stable Postop Assessment: no apparent nausea or vomiting Anesthetic complications: no     Last Vitals:  Vitals:   05/14/18 1500 05/14/18 1515  BP: 115/72 111/63  Pulse: 86 82  Resp: 17 15  Temp:    SpO2: (!) 88% (!) 88%    Last Pain:  Vitals:   05/14/18 1453  TempSrc:   PainSc: 7                  Bryley Kovacevic

## 2018-05-14 NOTE — Progress Notes (Signed)
Brief EGD and colonoscopy notes.  Large sliding hiatal hernia with linear erosions at the level of hiatus but no active bleeding. No evidence of peptic ulcer disease.  12 mm cecal polyp hot snare and 2 clips applied to polypectomy site. Rest of the colon unremarkable.  Comment: Patient's iron deficiency anemia is possibly due to blood loss from upper GI tract in setting of antiplatelet therapy and anticoagulant and she may also have impaired iron absorption because of PPI therapy which I am afraid she needs.

## 2018-05-14 NOTE — Anesthesia Preprocedure Evaluation (Signed)
Anesthesia Evaluation  Patient identified by MRN, date of birth, ID band Patient awake    Reviewed: Allergy & Precautions, H&P , NPO status , Patient's Chart, lab work & pertinent test results, reviewed documented beta blocker date and time   Airway Mallampati: III  TM Distance: >3 FB Neck ROM: full    Dental no notable dental hx.    Pulmonary neg pulmonary ROS, COPD, former smoker,    Pulmonary exam normal breath sounds clear to auscultation       Cardiovascular Exercise Tolerance: Good hypertension, + CAD  negative cardio ROS   Rhythm:regular Rate:Normal     Neuro/Psych Seizures -,  PSYCHIATRIC DISORDERS Anxiety Depression Bipolar Disorder CVA negative neurological ROS  negative psych ROS   GI/Hepatic negative GI ROS, Neg liver ROS, GERD  ,  Endo/Other  negative endocrine ROSHypothyroidism   Renal/GU negative Renal ROS  negative genitourinary   Musculoskeletal   Abdominal   Peds  Hematology negative hematology ROS (+) anemia ,   Anesthesia Other Findings Morbidly obese.  Largely bedridden. Severely phyiscally deconditioned Severe anemia  Hgb 5.2  Reproductive/Obstetrics negative OB ROS                             Anesthesia Physical Anesthesia Plan  ASA: IV  Anesthesia Plan: MAC   Post-op Pain Management:    Induction:   PONV Risk Score and Plan:   Airway Management Planned:   Additional Equipment:   Intra-op Plan:   Post-operative Plan:   Informed Consent: I have reviewed the patients History and Physical, chart, labs and discussed the procedure including the risks, benefits and alternatives for the proposed anesthesia with the patient or authorized representative who has indicated his/her understanding and acceptance.   Dental Advisory Given  Plan Discussed with: CRNA and Anesthesiologist  Anesthesia Plan Comments:         Anesthesia Quick Evaluation

## 2018-05-14 NOTE — Anesthesia Procedure Notes (Addendum)
Procedure Name: MAC Date/Time: 05/14/2018 1:51 PM Performed by: Vista Deck, CRNA Pre-anesthesia Checklist: Patient identified, Emergency Drugs available, Suction available, Timeout performed and Patient being monitored Patient Re-evaluated:Patient Re-evaluated prior to induction Oxygen Delivery Method: Non-rebreather mask

## 2018-05-14 NOTE — Op Note (Signed)
[facility] Patient Name: Karen Mccann Procedure Date: 05/14/2018 11:43 AM MRN: [id number] Date of Birth: April 16, 1951 Attending MD: Hildred Laser , MD CSN: [id number] Age: 67 Admit Type: Inpatient Procedure:                Upper GI endoscopy Indications:              Unexplained iron deficiency anemia Providers:                Hildred Laser, MD, Rosina Lowenstein, RN, Nelma Rothman,                            Technician Referring MD:             Heath Lark, DO Medicines:                Propofol per Anesthesia Complications:            No immediate complications. Estimated Blood Loss:     Estimated blood loss: none. Procedure:                Pre-Anesthesia Assessment:                           - Prior to the procedure, a History and Physical                            was performed, and patient medications and                            allergies were reviewed. The patient's tolerance of                            previous anesthesia was also reviewed. The risks                            and benefits of the procedure and the sedation                            options and risks were discussed with the patient.                            All questions were answered, and informed consent                            was obtained. Prior Anticoagulants: The patient                            last took aspirin 2 days and Eliquis (apixaban) 2                            days prior to the procedure. ASA Grade Assessment:                            IV - A patient with severe systemic disease that is  a constant threat to life. After reviewing the                            risks and benefits, the patient was deemed in                            satisfactory condition to undergo the procedure.                           After obtaining informed consent, the endoscope was                            passed under direct vision. Throughout the   procedure, the patient's blood pressure, pulse, and                            oxygen saturations were monitored continuously. The                            [id number]-[id number] ([id number]) scope was introduced through the                            mouth, and advanced to the third part of duodenum.                            The upper GI endoscopy was accomplished without                            difficulty. The patient tolerated the procedure                            well. Scope In: 2:07:29 PM Scope Out: 2:12:19 PM Total Procedure Duration: 0 hours 4 minutes 50 seconds  Findings:      The examined esophagus was normal.      The Z-line was regular and was found 30 cm from the incisors.      A 9 cm hiatal hernia was present.      A few, non-bleeding erosions were found in the gastric body. There were       no stigmata of recent bleeding.      The exam of the stomach was otherwise normal.      The duodenal bulb, second portion of the duodenum and third portion of       the duodenum were normal. Impression:               - Normal esophagus.                           - Z-line regular, 30 cm from the incisors.                           - 9 cm hiatal hernia.                           - Non-bleeding long linear erosions at the level of  hiatus.                           - Normal duodenal bulb, second portion of the                            duodenum and third portion of the duodenum.                           - No specimens collected. Moderate Sedation:      Per Anesthesia Care Recommendation:           - See the other procedure note for documentation of                            additional recommendations. Procedure Code(s):        --- Professional ---                           [phone], Esophagogastroduodenoscopy, flexible,                            transoral; diagnostic, including collection of                            specimen(s) by brushing or washing, when  performed                            (separate procedure) Diagnosis Code(s):        --- Professional ---                           K44.9, Diaphragmatic hernia without obstruction or                            gangrene                           K31.89, Other diseases of stomach and duodenum                           D50.9, Iron deficiency anemia, unspecified CPT copyright 2017 American [employer]. All rights reserved. The codes documented in this report are preliminary and upon coder review may  be revised to meet current compliance requirements. Hildred Laser, MD Hildred Laser, MD 05/14/2018 2:53:14 PM This report has been signed electronically. Number of Addenda: 0

## 2018-05-14 NOTE — Progress Notes (Signed)
PROGRESS NOTE    Karen Mccann  YBO:[id number] DOB: 1951/10/14 DOA: 05/12/2018 PCP: [name], MD   Brief Narrative:    Karen Mccann a14 y.o.female,with a history of stroke with resultant left-sided hemiplegia, CAD status post CABG, hypothyroidism, hyperlipidemia, seizures was sent to the ED from skilled nursing facility with chief complaints of anemia. Patient was found to be anemic on a routine lab work at the skilled facility and was found to have hemoglobin of 5.2.  She had some mild associated symptomatology of chest pain and shortness of breath as well as dizziness and generalized weakness.  She was also noted to have an abnormal UA and has been started on ceftriaxone.  Her home Eliquis and aspirin have been withheld for now and she is awaiting further evaluation with endoscopy per GI.  She is noted to have iron deficiency anemia and will receive Feraheme today.  Gram-negative rods noted in urine culture with further ID and sensitivity pending.   Assessment & Plan:   Active Problems:   Hypothyroid   Seizures (HCC)   CAD (coronary artery disease)   Lower urinary tract infectious disease   Anemia  1. Severe anemia-unclear etiology with Fe deficiency.    Improved status post 3 unit PRBC transfusion.  Endoscopy pending to evaluate for any source of GI bleed.  Supplement with Feraheme today due to significant iron deficiency noted. 2. UTI.  Abnormal UA noted and patient started on ceftriaxone.  Follow urine culture results.  Gram-negative rods noted thus far. 3. Candida skin infection.  Nystatin powder twice daily. 4. Hypothyroidism.  Continue Synthroid. 5. CAD status post CABG.  Hold aspirin due to severe anemia evaluation.  Continue nitroglycerin.  No chest pain noted. 6. History of seizures.  Continue Dilantin and check level. 7. History of stroke with left-sided hemiplegia.  Hold Eliquis due to above.   DVT prophylaxis:SCDs Code Status: DNR Family Communication:  None at bedside Disposition Plan: GI evaluation for anemia; PRBC transfusion. UTI evaluation with empiric Rocephin.   Consultants:   GI  Procedures:   None  Antimicrobials:   Rocephin 7/30->   Subjective: Patient seen and evaluated today with no new acute complaints or concerns. No acute concerns or events noted overnight.  Objective: Vitals:   05/13/18 1934 05/13/18 2142 05/14/18 0527 05/14/18 1230  BP:  (!) 131/54 128/63 (!) 109/55  Pulse:  82 79 76  Resp:    19  Temp:  98.9 F (37.2 C) 98.6 F (37 C) 98.8 F (37.1 C)  TempSrc:  Oral Oral Oral  SpO2: 96% 99% 95% 97%  Weight:    115.7 kg (255 lb)  Height:    5\' 6"  (1.676 m)    Intake/Output Summary (Last 24 hours) at 05/14/2018 1308 Last data filed at 05/14/2018 0900 Gross per 24 hour  Intake 590 ml  Output 1300 ml  Net -710 ml   Filed Weights   05/12/18 1641 05/12/18 2229 05/14/18 1230  Weight: 113.4 kg (250 lb) 116 kg (255 lb 11.7 oz) 115.7 kg (255 lb)    Examination:  General exam: Appears calm and comfortable; obese Respiratory system: Clear to auscultation. Respiratory effort normal. Cardiovascular system: S1 & S2 heard, RRR. No JVD, murmurs, rubs, gallops or clicks. No pedal edema. Gastrointestinal system: Abdomen is nondistended, soft and nontender. No organomegaly or masses felt. Normal bowel sounds heard. Central nervous system: Alert and oriented. No focal neurological deficits. Extremities: Symmetric 5 x 5 power. Skin: No rashes, lesions or ulcers  Data Reviewed: I have personally reviewed following labs and imaging studies  CBC: Recent Labs  Lab 05/12/18 1658 05/13/18 0445 05/14/18 0443  WBC 8.1 8.6 8.2  NEUTROABS 5.3  --   --   HGB 5.2* 5.3* 8.9*  HCT 21.4* 20.8* 31.7*  MCV 71.1* 71.0* 76.9*  PLT 298 304 008   Basic Metabolic Panel: Recent Labs  Lab 05/12/18 1658 05/13/18 0445 05/14/18 0443  NA 142 141 142  K 3.8 3.8 3.8  CL 109 110 107  CO2 28 27 30   GLUCOSE 110* 95 94    BUN 12 10 9   CREATININE 0.52 0.44 0.54  CALCIUM 8.0* 7.8* 8.3*   GFR: Estimated Creatinine Clearance: 90.6 mL/min (by C-G formula based on SCr of 0.54 mg/dL). Liver Function Tests: Recent Labs  Lab 05/12/18 1658 05/13/18 0445  AST 14* 16  ALT 15 16  ALKPHOS 155* 135*  BILITOT 0.3 0.2*  PROT 6.1* 5.8*  ALBUMIN 2.7* 2.7*   No results for input(s): LIPASE, AMYLASE in the last 168 hours. No results for input(s): AMMONIA in the last 168 hours. Coagulation Profile: No results for input(s): INR, PROTIME in the last 168 hours. Cardiac Enzymes: No results for input(s): CKTOTAL, CKMB, CKMBINDEX, TROPONINI in the last 168 hours. BNP (last 3 results) No results for input(s): PROBNP in the last 8760 hours. HbA1C: No results for input(s): HGBA1C in the last 72 hours. CBG: No results for input(s): GLUCAP in the last 168 hours. Lipid Profile: No results for input(s): CHOL, HDL, LDLCALC, TRIG, CHOLHDL, LDLDIRECT in the last 72 hours. Thyroid Function Tests: No results for input(s): TSH, T4TOTAL, FREET4, T3FREE, THYROIDAB in the last 72 hours. Anemia Panel: Recent Labs    05/12/18 1658 05/12/18 1718  VITAMINB12  --  979*  FOLATE  --  6.2  FERRITIN  --  4*  TIBC  --  319  IRON  --  12*  RETICCTPCT 3.8*  --    Sepsis Labs: No results for input(s): PROCALCITON, LATICACIDVEN in the last 168 hours.  Recent Results (from the past 240 hour(s))  Urine Culture     Status: Abnormal (Preliminary result)   Collection Time: 05/12/18  5:51 PM  Result Value Ref Range Status   Specimen Description   Final    URINE, RANDOM Performed at [facility], [address], [place], [place] [address]    Special Requests   Final    NONE Performed at [facility], [address], [place], [place] [address]    Culture >=100,000 COLONIES/mL GRAM NEGATIVE RODS (A)  Final   Report Status PENDING  Incomplete  MRSA PCR Screening     Status: None   Collection Time: 05/13/18 12:42 AM  Result Value Ref  Range Status   MRSA by PCR NEGATIVE NEGATIVE Final    Comment:        The GeneXpert MRSA Assay (FDA approved for NASAL specimens only), is one component of a comprehensive MRSA colonization surveillance program. It is not intended to diagnose MRSA infection nor to guide or monitor treatment for MRSA infections. Performed at [facility], [address], [place], [place] [address]          Radiology Studies: Dg Chest Portable 1 View  Result Date: 05/12/2018 CLINICAL DATA:  Intermittent chest pain over the last 2 weeks. EXAM: PORTABLE CHEST 1 VIEW COMPARISON:  03/23/2016 and previous FINDINGS: Previous median sternotomy and CABG. Mild chronic cardiomegaly. Hiatal hernia as seen previously. Pulmonary vascularity is normal. Lungs are clear.  No effusions. No acute bone finding. IMPRESSION: Previous CABG.  Hiatal hernia.  No active disease otherwise. Electronically Signed   By: Nelson Chimes M.D.   On: 05/12/2018 17:56        Scheduled Meds: . [MAR Hold] sodium chloride   Intravenous Once  . [MAR Hold] DULoxetine  30 mg Oral Daily  . [MAR Hold] escitalopram  20 mg Oral Daily  . [MAR Hold] furosemide  20 mg Oral Daily  . [MAR Hold] isosorbide mononitrate  45 mg Oral Daily  . [MAR Hold] latanoprost  1 drop Both Eyes QHS  . [MAR Hold] levothyroxine  300 mcg Oral QAC breakfast  . [MAR Hold] loratadine  10 mg Oral Daily  . [MAR Hold] LORazepam  0.5 mg Oral QHS  . [MAR Hold] metoprolol tartrate  25 mg Oral BID  . [MAR Hold] nystatin   Topical TID  . [MAR Hold] pantoprazole  40 mg Oral Daily  . [MAR Hold] phenytoin  200 mg Oral TID  . [MAR Hold] polyvinyl alcohol  1 drop Both Eyes BID  . [MAR Hold] pravastatin  80 mg Oral Daily  . [MAR Hold] pregabalin  75 mg Oral Daily  . [MAR Hold] vitamin B-12  1,000 mcg Oral Daily   Continuous Infusions: . sodium chloride    . [MAR Hold] cefTRIAXone (ROCEPHIN)  IV Stopped (05/13/18 2041)  . ferumoxytol       LOS: 2 days    Time spent:  30 minutes    Brucha Ahlquist Darleen Crocker, DO [facility] Pager [phone]  If 7PM-7AM, please contact night-coverage [email or web address] Password [email or web address] 05/14/2018, 1:08 PM

## 2018-05-14 NOTE — Op Note (Signed)
[facility] Patient Name: Karen Mccann Procedure Date: 05/14/2018 2:12 PM MRN: [id number] Date of Birth: 1951-05-01 Attending MD: Hildred Laser , MD CSN: [id number] Age: 67 Admit Type: Inpatient Procedure:                Colonoscopy Indications:              Unexplained iron deficiency anemia Providers:                Hildred Laser, MD, Rosina Lowenstein, RN, Nelma Rothman,                            Technician Referring MD:             Heath Lark, DO Medicines:                Propofol per Anesthesia Complications:            No immediate complications. Estimated Blood Loss:     Estimated blood loss was minimal. Procedure:                Pre-Anesthesia Assessment:                           - Prior to the procedure, a History and Physical                            was performed, and patient medications and                            allergies were reviewed. The patient's tolerance of                            previous anesthesia was also reviewed. The risks                            and benefits of the procedure and the sedation                            options and risks were discussed with the patient.                            All questions were answered, and informed consent                            was obtained. Prior Anticoagulants: The patient                            last took aspirin 2 days and Eliquis (apixaban) 2                            days prior to the procedure. ASA Grade Assessment:                            IV - A patient with severe systemic disease that is  a constant threat to life. After reviewing the                            risks and benefits, the patient was deemed in                            satisfactory condition to undergo the procedure.                           After obtaining informed consent, the colonoscope                            was passed under direct vision. Throughout the   procedure, the patient's blood pressure, pulse, and                            oxygen saturations were monitored continuously. The                            PCF-H190DL ([id number]) was introduced through the                            anus and advanced to the the cecum, identified by                            appendiceal orifice and ileocecal valve. The                            colonoscopy was performed without difficulty. The                            patient tolerated the procedure well. The quality                            of the bowel preparation was adequate. The                            ileocecal valve, appendiceal orifice, and rectum                            were photographed. Scope In: 2:17:21 PM Scope Out: 2:39:01 PM Scope Withdrawal Time: 0 hours 15 minutes 44 seconds  Total Procedure Duration: 0 hours 21 minutes 40 seconds  Findings:      The digital rectal exam findings include decreased sphincter tone.      A 12 mm polyp was found in the cecum. The polyp was multi-lobulated. The       polyp was removed with a hot snare. Resection and retrieval were       complete. The pathology specimen was placed into Bottle Number 1. To       prevent bleeding after the polypectomy, two hemostatic clips were       successfully placed (MR conditional). There was no bleeding during, or       at the end, of the procedure.      The exam was otherwise normal throughout the  examined colon.      The retroflexed view of the distal rectum and anal verge was normal and       showed no anal or rectal abnormalities. Impression:               - Decreased sphincter tone found on digital rectal                            exam.                           - One 12 mm polyp in the cecum, removed with a hot                            snare. Resected and retrieved. Clips (MR                            conditional) were placed. Moderate Sedation:      Per Anesthesia Care Recommendation:           -  Return patient to hospital ward for ongoing care.                           - Cardiac diet today.                           - Continue present medications.                           - Resume aspirin tomorrow and Eliquis (apixaban) in                            3 days at prior doses.                           - Await pathology results.                           -Ferrous sulfate 325 mg po qd.                           - No recommendation at this time regarding repeat                            colonoscopy. Procedure Code(s):        --- Professional ---                           [id number], Colonoscopy, flexible; with removal of                            tumor(s), polyp(s), or other lesion(s) by snare                            technique Diagnosis Code(s):        --- Professional ---  K62.89, Other specified diseases of anus and rectum                           D12.0, Benign neoplasm of cecum                           D50.9, Iron deficiency anemia, unspecified CPT copyright 2017 American Medical Association. All rights reserved. The codes documented in this report are preliminary and upon coder review may  be revised to meet current compliance requirements. Hildred Laser, MD Hildred Laser, MD 05/14/2018 2:58:44 PM This report has been signed electronically. Number of Addenda: 0

## 2018-05-15 LAB — BASIC METABOLIC PANEL
ANION GAP: 5 (ref 5–15)
BUN: 8 mg/dL (ref 8–23)
CALCIUM: 8.2 mg/dL — AB (ref 8.9–10.3)
CO2: 29 mmol/L (ref 22–32)
Chloride: 109 mmol/L (ref 98–111)
Creatinine, Ser: 0.6 mg/dL (ref 0.44–1.00)
GFR calc Af Amer: >60 mL/min (ref >60)
GLUCOSE: 97 mg/dL (ref 70–99)
POTASSIUM: 3.6 mmol/L (ref 3.5–5.1)
Sodium: 143 mmol/L (ref 135–145)

## 2018-05-15 LAB — CBC
HCT: 31.6 % — ABNORMAL LOW (ref 36.0–46.0)
HEMOGLOBIN: 8.8 g/dL — AB (ref 12.0–15.0)
MCH: 21.5 pg — AB (ref 26.0–34.0)
MCHC: 27.8 g/dL — ABNORMAL LOW (ref 30.0–36.0)
MCV: 77.1 fL — ABNORMAL LOW (ref 78.0–100.0)
PLATELETS: 241 10*3/uL (ref 150–400)
RBC: 4.1 MIL/uL (ref 3.87–5.11)
RDW: 22.5 % — ABNORMAL HIGH (ref 11.5–15.5)
WBC: 7.7 10*3/uL (ref 4.0–10.5)

## 2018-05-15 MED ORDER — FOSFOMYCIN TROMETHAMINE 3 G PO PACK
3.0000 g | PACK | Freq: Once | ORAL | Status: AC
  Administered 2018-05-15: 3 g via ORAL
  Filled 2018-05-15: qty 3

## 2018-05-15 MED ORDER — NYSTATIN 100000 UNIT/GM EX POWD: Freq: Three times a day (TID) | CUTANEOUS | 0 refills | Status: DC

## 2018-05-15 MED ORDER — APIXABAN 5 MG PO TABS: 5.0000 mg | ORAL_TABLET | Freq: Two times a day (BID) | ORAL | 0 refills | Status: DC

## 2018-05-15 MED ORDER — FERROUS SULFATE 325 (65 FE) MG PO TABS: 325.0000 mg | ORAL_TABLET | Freq: Every day | ORAL | 3 refills | Status: DC

## 2018-05-15 NOTE — Progress Notes (Signed)
IV removed, 2x2 gauze and paper tape applied to site, patient tolerated well.  Called Report to Caryl Pina, Therapist, sports at [facility].  Patient awaiting EMS transport to return to facility.

## 2018-05-15 NOTE — Discharge Summary (Signed)
Physician Discharge Summary  Karen Mccann YWV:[id number] DOB: 04-01-1951 DOA: 05/12/2018  PCP: [name], MD  Admit date: 05/12/2018  Discharge date: 05/15/2018  Admitted From:SNF  Disposition:  [facility]  Recommendations for Outpatient Follow-up:  1. Follow up with PCP in 1-2 weeks 2. Follow up final urine cultures; pt did receive dose of fosfomycin prior to DC 3. Follow-up CBC in 1 week. 4. Restart Eliquis in 48-72 hours.  Home Health:N/A  Equipment/Devices:N/A  Discharge Condition:Stable  CODE STATUS: Full  Diet recommendation: Heart Healthy  Brief/Interim Summary:  [name] a67 y.o.female,with a history of strokewith resultant left-sided hemiplegia, CAD status post CABG, hypothyroidism, hyperlipidemia, seizures was sent to the ED from skilled nursing facility with chief complaints of anemia. Patient was found to be anemic on a routine lab work at the skilled facility and was found to have hemoglobin of 5.2.She had some mild associated symptomatology of chest pain and shortness of breath as well as dizziness and generalized weakness. She was also noted to have an abnormal UA and has been started on ceftriaxone.  She has received a dose of fosfomycin, given the fact that she has had prior E. coli with ESBL, prior to discharge and has remained asymptomatic from the standpoint.  Her urine culture still pending with noted gram-negative rods and will need follow-up.  She has undergone colonoscopy with polypectomy as well as placement of 2 clips for hemostasis along with upper endoscopy with noted hiatal hernia with linear erosions and no active bleed.  Please see GI notes for full details.  She has been resumed on her aspirin today and can be resumed on her Eliquis and another 48 hours.  She has been started on oral iron supplementation due to her iron deficiency anemia which appears to be related to poor oral absorption given her chronic PPI use.  She has also received  a dose of IV Feraheme.   Discharge Diagnoses:  Active Problems:   Hypothyroid   Seizures (HCC)   CAD (coronary artery disease)   Lower urinary tract infectious disease   Anemia  1. Severe anemia-with Fe deficiency.   Improved status post 3 unit PRBC transfusion.  Endoscopy performed on 8/1 with no active bleeding noted.  Patient has received 1 dose of IV Feraheme prior to discharge and will remain on oral iron supplementation as prescribed.  Patient likely has poor iron absorption due to chronic PPI use.  Follow-up CBC in 1 week. 2. UTI. She has remained asymptomatic from the standpoint, and is noted to have gram-negative rods in urine culture thus far with final result pending.  Previously noted to have E. coli ESBL and has been treated with Rocephin as well as 1 dose of fosfomycin prior to discharge. 3. Candida skin infection.  Nystatin powder twice daily. 4. Hypothyroidism. Continue Synthroid. 5. CAD status post CABG. No chest pain noted.  Continue back on aspirin. 6. History of seizures. Continue Dilantin. 7. History of stroke with left-sided hemiplegia.  Eliquis may be restarted in 48-72 hours.   Discharge Instructions  Discharge Instructions    Diet - low sodium heart healthy   Complete by:  As directed    Increase activity slowly   Complete by:  As directed      Allergies as of 05/15/2018      Reactions   Latex Other (See Comments)   unknown      Medication List    STOP taking these medications   LORazepam 0.5 MG tablet Commonly known as:  ATIVAN     TAKE these medications   apixaban 5 MG Tabs tablet Commonly known as:  ELIQUIS Take 1 tablet (5 mg total) by mouth 2 (two) times daily. Start taking on:  05/18/2018 What changed:  These instructions start on 05/18/2018. If you are unsure what to do until then, ask your doctor or other care provider.   aspirin EC 81 MG tablet Take 81 mg by mouth daily.   calcium-vitamin D 500-200 MG-UNIT tablet Commonly known as:   OSCAL WITH D Take 1 tablet by mouth daily with breakfast.   cetirizine 10 MG tablet Commonly known as:  ZYRTEC Take 10 mg by mouth daily.   DULoxetine 30 MG capsule Commonly known as:  CYMBALTA Take 30 mg by mouth daily.   escitalopram 20 MG tablet Commonly known as:  LEXAPRO Take 20 mg by mouth daily.   esomeprazole 20 MG capsule Commonly known as:  NEXIUM Take 20 mg by mouth daily at 12 noon.   ferrous sulfate 325 (65 FE) MG tablet Take 1 tablet (325 mg total) by mouth daily with breakfast. Start taking on:  05/16/2018   fluticasone 50 MCG/ACT nasal spray Commonly known as:  FLONASE Place 1 spray into both nostrils at bedtime.   furosemide 20 MG tablet Commonly known as:  LASIX Take 20 mg by mouth daily.   hydrOXYzine 25 MG tablet Commonly known as:  ATARAX/VISTARIL Take 25 mg by mouth daily as needed for itching.   ipratropium-albuterol 0.5-2.5 (3) MG/3ML Soln Commonly known as:  DUONEB Take 3 mLs by nebulization 3 (three) times daily as needed.   isosorbide mononitrate 30 MG 24 hr tablet Commonly known as:  IMDUR Take 45 mg by mouth daily.   latanoprost 0.005 % ophthalmic solution Commonly known as:  XALATAN Place 1 drop into both eyes at bedtime. Reported on 01/17/2016   levothyroxine 300 MCG tablet Commonly known as:  SYNTHROID, LEVOTHROID Take 300 mcg by mouth daily before breakfast.   lidocaine 5 % Commonly known as:  LIDODERM Place 1 patch onto the skin daily as needed. Remove & Discard patch within 12 hours or as directed by MD   metoprolol tartrate 25 MG tablet Commonly known as:  LOPRESSOR Take 1 tablet (25 mg total) by mouth 2 (two) times daily.   multivitamin with minerals Tabs tablet Take 1 tablet by mouth daily.   MUSCLE RUB 10-15 % Crea Apply 1 application topically every evening. Applied to left leg and arm   nitroGLYCERIN 0.4 MG SL tablet Commonly known as:  NITROSTAT Place 0.4 mg under the tongue every 5 (five) minutes as needed for  chest pain.   nystatin powder Commonly known as:  MYCOSTATIN/NYSTOP Apply topically 3 (three) times daily.   ondansetron 4 MG tablet Commonly known as:  ZOFRAN Take 4 mg by mouth every 4 (four) hours as needed for nausea or vomiting.   oxyCODONE-acetaminophen 5-325 MG tablet Commonly known as:  PERCOCET/ROXICET Take 1 tablet by mouth every 6 (six) hours as needed for moderate pain or severe pain.   phenytoin 200 MG ER capsule Commonly known as:  DILANTIN Take 200 mg by mouth 3 (three) times daily.   polyethylene glycol powder powder Commonly known as:  GLYCOLAX/MIRALAX Take 17 g by mouth daily.   pravastatin 80 MG tablet Commonly known as:  PRAVACHOL Take 80 mg by mouth daily.   pregabalin 75 MG capsule Commonly known as:  LYRICA Take 75 mg by mouth daily.   senna 8.6 MG Tabs tablet  Commonly known as:  SENOKOT Take 1 tablet by mouth 2 (two) times daily.   SYSTANE BALANCE 0.6 % Soln Generic drug:  Propylene Glycol Place 1 drop into both eyes 2 (two) times daily. Both eyes   tizanidine 2 MG capsule Commonly known as:  ZANAFLEX Take 2 mg by mouth every 6 (six) hours as needed for muscle spasms.   vitamin B-12 1000 MCG tablet Commonly known as:  CYANOCOBALAMIN Take 1,000 mcg by mouth daily.       Contact information for follow-up providers    [name], MD Follow up in 2 week(s).   Specialty:  Internal Medicine Contact information: [address] [place] [address] [phone]            Contact information for after-discharge care    Destination    [facility] .   Service:  Skilled Nursing Contact information: [place] [phone]                 Allergies  Allergen Reactions  . Latex Other (See Comments)    unknown    Consultations:  GI Dr. Laural Golden   Procedures/Studies: Dg Chest Portable 1 View  Result Date: 05/12/2018 CLINICAL DATA:  Intermittent chest  pain over the last 2 weeks. EXAM: PORTABLE CHEST 1 VIEW COMPARISON:  03/23/2016 and previous FINDINGS: Previous median sternotomy and CABG. Mild chronic cardiomegaly. Hiatal hernia as seen previously. Pulmonary vascularity is normal. Lungs are clear. No effusions. No acute bone finding. IMPRESSION: Previous CABG.  Hiatal hernia.  No active disease otherwise. Electronically Signed   By: Nelson Chimes M.D.   On: 05/12/2018 17:56   Discharge Exam: Vitals:   05/14/18 2118 05/15/18 0603  BP: 127/60 128/71  Pulse: (!) 105 81  Resp:    Temp: 98.8 F (37.1 C) 98.6 F (37 C)  SpO2: 94% 96%   Vitals:   05/14/18 1533 05/14/18 1914 05/14/18 2118 05/15/18 0603  BP: (!) 103/44  127/60 128/71  Pulse: 78  (!) 105 81  Resp: 18     Temp: 98.3 F (36.8 C) 99.4 F (37.4 C) 98.8 F (37.1 C) 98.6 F (37 C)  TempSrc: Oral Oral Oral Oral  SpO2: 93%  94% 96%  Weight:      Height:        General: Pt is alert, awake, not in acute distress; obese Cardiovascular: RRR, S1/S2 +, no rubs, no gallops Respiratory: CTA bilaterally, no wheezing, no rhonchi Abdominal: Soft, NT, ND, bowel sounds + Extremities: no edema, no cyanosis    The results of significant diagnostics from this hospitalization (including imaging, microbiology, ancillary and laboratory) are listed below for reference.     Microbiology: Recent Results (from the past 240 hour(s))  Urine Culture     Status: Abnormal (Preliminary result)   Collection Time: 05/12/18  5:51 PM  Result Value Ref Range Status   Specimen Description   Final    URINE, RANDOM Performed at [facility], [address] [place], [place] [address]    Special Requests   Final    NONE Performed at [facility], [address], [place], [place] [address]    Culture >=100,000 COLONIES/mL GRAM NEGATIVE RODS (A)  Final   Report Status PENDING  Incomplete  MRSA PCR Screening     Status: None   Collection Time: 05/13/18 12:42 AM  Result Value Ref Range Status   MRSA  by PCR NEGATIVE NEGATIVE Final  Comment:        The GeneXpert MRSA Assay (FDA approved for NASAL specimens only), is one component of a comprehensive MRSA colonization surveillance program. It is not intended to diagnose MRSA infection nor to guide or monitor treatment for MRSA infections. Performed at [facility], [address] [place], [place] [address]      Labs: BNP (last 3 results) No results for input(s): BNP in the last 8760 hours. Basic Metabolic Panel: Recent Labs  Lab 05/12/18 1658 05/13/18 0445 05/14/18 0443 05/15/18 0538  NA 142 141 142 143  K 3.8 3.8 3.8 3.6  CL 109 110 107 109  CO2 28 27 30 29   GLUCOSE 110* 95 94 97  BUN 12 10 9 8   CREATININE 0.52 0.44 0.54 0.60  CALCIUM 8.0* 7.8* 8.3* 8.2*   Liver Function Tests: Recent Labs  Lab 05/12/18 1658 05/13/18 0445  AST 14* 16  ALT 15 16  ALKPHOS 155* 135*  BILITOT 0.3 0.2*  PROT 6.1* 5.8*  ALBUMIN 2.7* 2.7*   No results for input(s): LIPASE, AMYLASE in the last 168 hours. No results for input(s): AMMONIA in the last 168 hours. CBC: Recent Labs  Lab 05/12/18 1658 05/13/18 0445 05/14/18 0443 05/15/18 0538  WBC 8.1 8.6 8.2 7.7  NEUTROABS 5.3  --   --   --   HGB 5.2* 5.3* 8.9* 8.8*  HCT 21.4* 20.8* 31.7* 31.6*  MCV 71.1* 71.0* 76.9* 77.1*  PLT 298 304 258 241   Cardiac Enzymes: No results for input(s): CKTOTAL, CKMB, CKMBINDEX, TROPONINI in the last 168 hours. BNP: Invalid input(s): POCBNP CBG: No results for input(s): GLUCAP in the last 168 hours. D-Dimer No results for input(s): DDIMER in the last 72 hours. Hgb A1c No results for input(s): HGBA1C in the last 72 hours. Lipid Profile No results for input(s): CHOL, HDL, LDLCALC, TRIG, CHOLHDL, LDLDIRECT in the last 72 hours. Thyroid function studies No results for input(s): TSH, T4TOTAL, T3FREE, THYROIDAB in the last 72 hours.  Invalid input(s): FREET3 Anemia work up Recent Labs    05/12/18 1658 05/12/18 1718  VITAMINB12  --   979*  FOLATE  --  6.2  FERRITIN  --  4*  TIBC  --  319  IRON  --  12*  RETICCTPCT 3.8*  --    Urinalysis    Component Value Date/Time   COLORURINE YELLOW 05/12/2018 1751   APPEARANCEUR CLOUDY (A) 05/12/2018 1751   LABSPEC 1.017 05/12/2018 1751   PHURINE 7.0 05/12/2018 1751   GLUCOSEU NEGATIVE 05/12/2018 1751   HGBUR NEGATIVE 05/12/2018 1751   BILIRUBINUR NEGATIVE 05/12/2018 1751   KETONESUR NEGATIVE 05/12/2018 1751   PROTEINUR NEGATIVE 05/12/2018 1751   NITRITE NEGATIVE 05/12/2018 1751   LEUKOCYTESUR LARGE (A) 05/12/2018 1751   Sepsis Labs Invalid input(s): PROCALCITONIN,  WBC,  LACTICIDVEN Microbiology Recent Results (from the past 240 hour(s))  Urine Culture     Status: Abnormal (Preliminary result)   Collection Time: 05/12/18  5:51 PM  Result Value Ref Range Status   Specimen Description   Final    URINE, RANDOM Performed at [facility], [address], [place], [place] [address]    Special Requests   Final    NONE Performed at [facility], [address] [place], [place] [address]    Culture >=100,000 COLONIES/mL GRAM NEGATIVE RODS (A)  Final   Report Status PENDING  Incomplete  MRSA PCR Screening     Status: None   Collection Time: 05/13/18 12:42 AM  Result  Value Ref Range Status   MRSA by PCR NEGATIVE NEGATIVE Final    Comment:        The GeneXpert MRSA Assay (FDA approved for NASAL specimens only), is one component of a comprehensive MRSA colonization surveillance program. It is not intended to diagnose MRSA infection nor to guide or monitor treatment for MRSA infections. Performed at [facility], [address] [place], [place] [address]      Time coordinating discharge: 40 minutes  SIGNED:   Rodena Goldmann, DO [facility] 05/15/2018, 10:17 AM Pager [phone]  If 7PM-7AM, please contact night-coverage [email or web address] Password [email or web address]

## 2018-05-15 NOTE — Clinical Social Work Note (Addendum)
Facility advised of discharge and discharge clinicals sent. Daughter, Amy, advised of discharge. Transport arranged with [facility] by Therapist, sports.   LCSW signing off.   Christen Wardrop, Clydene Pugh, LCSW

## 2018-05-15 NOTE — Care Management Important Message (Signed)
Important Message  Patient Details  Name: Karen Mccann MRN: [id number] Date of Birth: [birth year]   Medicare Important Message Given:  Yes  Gave to pt. Nurse.  Holli Humbles Smith 05/15/2018, 12:54 PM

## 2018-05-16 LAB — URINE CULTURE

## 2018-05-18 ENCOUNTER — Encounter (HOSPITAL_COMMUNITY): Payer: Self-pay | Admitting: Internal Medicine

## 2018-05-22 ENCOUNTER — Telehealth (INDEPENDENT_AMBULATORY_CARE_PROVIDER_SITE_OTHER): Payer: Self-pay | Admitting: *Deleted

## 2018-05-22 NOTE — Telephone Encounter (Signed)
Called and spoke with the patient's nurse she is going to order H&H and it will be done tomorrow,05/23/2018. They are going to call Dr.Rehman with the result.

## 2018-07-21 ENCOUNTER — Ambulatory Visit (INDEPENDENT_AMBULATORY_CARE_PROVIDER_SITE_OTHER): Payer: Medicare Other | Admitting: Internal Medicine

## 2018-07-21 ENCOUNTER — Encounter (INDEPENDENT_AMBULATORY_CARE_PROVIDER_SITE_OTHER): Payer: Self-pay | Admitting: Internal Medicine

## 2018-07-21 VITALS — BP 132/68 | HR 92 | Temp 98.7°F | Ht 66.0 in | Wt 246.0 lb

## 2018-07-21 DIAGNOSIS — D508 Other iron deficiency anemias: Secondary | ICD-10-CM

## 2018-07-21 NOTE — Patient Instructions (Signed)
Will get labs from NH. 3 stool cards needed from NH>

## 2018-07-21 NOTE — Progress Notes (Signed)
   Subjective:    Patient ID: Karen Mccann, female    DOB: August 08, 1951, 67 y.o.   MRN: [id number]  HPI Here today for f/u. Underwent and EGD and colonoscopy in August of this year for IDA. Noted to have hemoglobin of 5.2. Her stool was guaiac negative.  She had been on low dose ASA as well as Eliquis.    EGD revealed normal esophagus. 9 cm hiatal hernia. Non-bleeding long linear erosion at level of hiatus. Normal duodenal bulb, 2nd portion of duoden and 3rd portion of duodenum. Colonoscopy revealed 12 mm polyp in cecum which was hot snared. Colonoscopy otherwise normal. Biopsy: tubular adenoma.  Family hx significant for ovarian cancer in mother in her 3 s and daughter in her 50s.  07/08/2018 Hemoglobin 8.3 W/C bound. Her foot is in a boot from an old CVA. Her appetite is good. No weight loss. Some GERD. Sometimes she does have abdominal pain (lower). She has not seen any rectal bleeding.  Her stools are green per patient from the iron.  She has a BM daily.  Hx significant for CVA and maintained on Eliquis and ASA. CBC    Component Value Date/Time   WBC 7.7 05/15/2018 0538   RBC 4.10 05/15/2018 0538   HGB 8.8 (L) 05/15/2018 0538   HCT 31.6 (L) 05/15/2018 0538   PLT 241 05/15/2018 0538   MCV 77.1 (L) 05/15/2018 0538   MCH 21.5 (L) 05/15/2018 0538   MCHC 27.8 (L) 05/15/2018 0538   RDW 22.5 (H) 05/15/2018 0538   LYMPHSABS 1.9 05/12/2018 1658   MONOABS 0.8 05/12/2018 1658   EOSABS 0.1 05/12/2018 1658   BASOSABS 0.0 05/12/2018 1658     Review of Systems     Objective:   Physical Exam Blood pressure 132/68, pulse 92, temperature 98.7 F (37.1 C), height 5\' 6"  (1.676 m), weight 246 lb (111.6 kg). Examined from w/c.  (Patioent does not ambulate) Alert and oriented. Skin warm and dry. Oral mucosa is moist.   . Sclera anicteric, conjunctivae is pink. Thyroid not enlarged. No cervical lymphadenopathy. Lungs clear. Heart regular rate and rhythm.  Abdomen is soft. Bowel sounds are positive.  No hepatomegaly. No abdominal masses felt. No tenderness.    1+ edema to rt lower leg. Left foot in a boot.  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 Grossly obese.        Assessment & Plan:  IDA. Will get recent labs from NH. Hemoglobin 07/08/2018 8.3. Further recommendations to follow. EGD and colonoscopy did not show bleeding.  Need 3 stools from NH.

## 2019-02-08 ENCOUNTER — Other Ambulatory Visit: Payer: Self-pay

## 2019-02-08 ENCOUNTER — Encounter (HOSPITAL_COMMUNITY): Payer: Self-pay | Admitting: Emergency Medicine

## 2019-02-08 ENCOUNTER — Emergency Department (HOSPITAL_COMMUNITY): Payer: Medicare Other

## 2019-02-08 ENCOUNTER — Inpatient Hospital Stay (HOSPITAL_COMMUNITY)
Admission: EM | Admit: 2019-02-08 | Discharge: 2019-02-09 | DRG: 100 | Disposition: A | Discharge status: Skilled Nursing Facility | Payer: Medicare Other | Attending: Internal Medicine | Admitting: Internal Medicine

## 2019-02-08 DIAGNOSIS — N3000 Acute cystitis without hematuria: Secondary | ICD-10-CM | POA: Diagnosis not present

## 2019-02-08 DIAGNOSIS — Z7982 Long term (current) use of aspirin: Secondary | ICD-10-CM | POA: Diagnosis not present

## 2019-02-08 DIAGNOSIS — Z79890 Hormone replacement therapy (postmenopausal): Secondary | ICD-10-CM

## 2019-02-08 DIAGNOSIS — I10 Essential (primary) hypertension: Secondary | ICD-10-CM | POA: Diagnosis not present

## 2019-02-08 DIAGNOSIS — J449 Chronic obstructive pulmonary disease, unspecified: Secondary | ICD-10-CM | POA: Diagnosis not present

## 2019-02-08 DIAGNOSIS — Z951 Presence of aortocoronary bypass graft: Secondary | ICD-10-CM

## 2019-02-08 DIAGNOSIS — G92 Toxic encephalopathy: Secondary | ICD-10-CM | POA: Diagnosis not present

## 2019-02-08 DIAGNOSIS — Z6841 Body Mass Index (BMI) 40.0 and over, adult: Secondary | ICD-10-CM

## 2019-02-08 DIAGNOSIS — Z91040 Latex allergy status: Secondary | ICD-10-CM | POA: Diagnosis not present

## 2019-02-08 DIAGNOSIS — I639 Cerebral infarction, unspecified: Secondary | ICD-10-CM | POA: Diagnosis present

## 2019-02-08 DIAGNOSIS — Z87891 Personal history of nicotine dependence: Secondary | ICD-10-CM

## 2019-02-08 DIAGNOSIS — G40901 Epilepsy, unspecified, not intractable, with status epilepticus: Principal | ICD-10-CM | POA: Diagnosis present

## 2019-02-08 DIAGNOSIS — F319 Bipolar disorder, unspecified: Secondary | ICD-10-CM | POA: Diagnosis present

## 2019-02-08 DIAGNOSIS — G8104 Flaccid hemiplegia affecting left nondominant side: Secondary | ICD-10-CM | POA: Diagnosis not present

## 2019-02-08 DIAGNOSIS — Z1624 Resistance to multiple antibiotics: Secondary | ICD-10-CM | POA: Diagnosis present

## 2019-02-08 DIAGNOSIS — Z79899 Other long term (current) drug therapy: Secondary | ICD-10-CM | POA: Diagnosis not present

## 2019-02-08 DIAGNOSIS — K219 Gastro-esophageal reflux disease without esophagitis: Secondary | ICD-10-CM | POA: Diagnosis not present

## 2019-02-08 DIAGNOSIS — Z86010 Personal history of colonic polyps: Secondary | ICD-10-CM

## 2019-02-08 DIAGNOSIS — D649 Anemia, unspecified: Secondary | ICD-10-CM | POA: Diagnosis present

## 2019-02-08 DIAGNOSIS — Z8249 Family history of ischemic heart disease and other diseases of the circulatory system: Secondary | ICD-10-CM

## 2019-02-08 DIAGNOSIS — E039 Hypothyroidism, unspecified: Secondary | ICD-10-CM | POA: Diagnosis not present

## 2019-02-08 DIAGNOSIS — Z79891 Long term (current) use of opiate analgesic: Secondary | ICD-10-CM

## 2019-02-08 DIAGNOSIS — R569 Unspecified convulsions: Secondary | ICD-10-CM | POA: Diagnosis present

## 2019-02-08 DIAGNOSIS — IMO0002 Reserved for concepts with insufficient information to code with codable children: Secondary | ICD-10-CM

## 2019-02-08 DIAGNOSIS — D509 Iron deficiency anemia, unspecified: Secondary | ICD-10-CM | POA: Diagnosis present

## 2019-02-08 DIAGNOSIS — Z7901 Long term (current) use of anticoagulants: Secondary | ICD-10-CM

## 2019-02-08 DIAGNOSIS — E872 Acidosis: Secondary | ICD-10-CM | POA: Diagnosis not present

## 2019-02-08 DIAGNOSIS — R7889 Finding of other specified substances, not normally found in blood: Secondary | ICD-10-CM

## 2019-02-08 DIAGNOSIS — I2510 Atherosclerotic heart disease of native coronary artery without angina pectoris: Secondary | ICD-10-CM | POA: Diagnosis not present

## 2019-02-08 DIAGNOSIS — Z1635 Resistance to multiple antimicrobial drugs: Secondary | ICD-10-CM

## 2019-02-08 HISTORY — DX: Unspecified convulsions: R56.9

## 2019-02-08 LAB — COMPREHENSIVE METABOLIC PANEL
ALT: 16 U/L (ref 0–44)
AST: 20 U/L (ref 15–41)
Albumin: 3.6 g/dL (ref 3.5–5.0)
Alkaline Phosphatase: 126 U/L (ref 38–126)
Anion gap: 14 (ref 5–15)
BUN: 11 mg/dL (ref 8–23)
CO2: 21 mmol/L — ABNORMAL LOW (ref 22–32)
Calcium: 8.8 mg/dL — ABNORMAL LOW (ref 8.9–10.3)
Chloride: 106 mmol/L (ref 98–111)
Creatinine, Ser: 0.77 mg/dL (ref 0.44–1.00)
GFR calc Af Amer: >60 mL/min (ref >60)
GFR calc non Af Amer: >60 mL/min (ref >60)
Glucose, Bld: 121 mg/dL — ABNORMAL HIGH (ref 70–99)
Potassium: 3.9 mmol/L (ref 3.5–5.1)
Sodium: 141 mmol/L (ref 135–145)
Total Bilirubin: 0.2 mg/dL — ABNORMAL LOW (ref 0.3–1.2)
Total Protein: 7 g/dL (ref 6.5–8.1)

## 2019-02-08 LAB — URINALYSIS, ROUTINE W REFLEX MICROSCOPIC
Bilirubin Urine: NEGATIVE
Glucose, UA: NEGATIVE mg/dL
Ketones, ur: NEGATIVE mg/dL
Nitrite: POSITIVE — AB
Specific Gravity, Urine: 1.02 (ref 1.005–1.030)
pH: 5.5 (ref 5.0–8.0)

## 2019-02-08 LAB — BLOOD GAS, ARTERIAL
Acid-base deficit: 7 mmol/L — ABNORMAL HIGH (ref 0.0–2.0)
Bicarbonate: 17.6 mmol/L — ABNORMAL LOW (ref 20.0–28.0)
FIO2: 28
O2 Saturation: 77.8 %
Patient temperature: 36.3
pCO2 arterial: 56 mmHg — ABNORMAL HIGH (ref 32.0–48.0)
pH, Arterial: 7.179 — CL (ref 7.350–7.450)
pO2, Arterial: 57.3 mmHg — ABNORMAL LOW (ref 83.0–108.0)

## 2019-02-08 LAB — CBC WITH DIFFERENTIAL/PLATELET
Abs Immature Granulocytes: 0.05 10*3/uL (ref 0.00–0.07)
Basophils Absolute: 0.1 10*3/uL (ref 0.0–0.1)
Basophils Relative: 1 %
Eosinophils Absolute: 0.2 10*3/uL (ref 0.0–0.5)
Eosinophils Relative: 2 %
HCT: 38.7 % (ref 36.0–46.0)
Hemoglobin: 11.7 g/dL — ABNORMAL LOW (ref 12.0–15.0)
Immature Granulocytes: 1 %
Lymphocytes Relative: 39 %
Lymphs Abs: 3.8 10*3/uL (ref 0.7–4.0)
MCH: 28.7 pg (ref 26.0–34.0)
MCHC: 30.2 g/dL (ref 30.0–36.0)
MCV: 95.1 fL (ref 80.0–100.0)
Monocytes Absolute: 0.7 10*3/uL (ref 0.1–1.0)
Monocytes Relative: 7 %
Neutro Abs: 5.2 10*3/uL (ref 1.7–7.7)
Neutrophils Relative %: 50 %
Platelets: 272 10*3/uL (ref 150–400)
RBC: 4.07 MIL/uL (ref 3.87–5.11)
RDW: 15.1 % (ref 11.5–15.5)
WBC: 10 10*3/uL (ref 4.0–10.5)
nRBC: 0 % (ref 0.0–0.2)

## 2019-02-08 LAB — LACTIC ACID, PLASMA: Lactic Acid, Venous: 6.2 mmol/L (ref 0.5–1.9)

## 2019-02-08 LAB — TROPONIN I: Troponin I: <0.03 ng/mL (ref <0.03)

## 2019-02-08 LAB — CBG MONITORING, ED: Glucose-Capillary: 173 mg/dL — ABNORMAL HIGH (ref 70–99)

## 2019-02-08 LAB — PHENYTOIN LEVEL, TOTAL: Phenytoin Lvl: <2.5 ug/mL — ABNORMAL LOW (ref 10.0–20.0)

## 2019-02-08 LAB — URINALYSIS, MICROSCOPIC (REFLEX)
RBC / HPF: >50 RBC/hpf (ref 0–5)
WBC, UA: >50 WBC/hpf (ref 0–5)

## 2019-02-08 MED ORDER — LORAZEPAM 2 MG/ML IJ SOLN
1.0000 mg | Freq: Once | INTRAMUSCULAR | Status: AC
  Administered 2019-02-08: 11:00:00 1 mg via INTRAVENOUS

## 2019-02-08 MED ORDER — SODIUM CHLORIDE 0.9 % IV BOLUS
1000.0000 mL | Freq: Once | INTRAVENOUS | Status: AC
  Administered 2019-02-08: 10:00:00 1000 mL via INTRAVENOUS

## 2019-02-08 MED ORDER — LORAZEPAM 2 MG/ML IJ SOLN
1.0000 mg | Freq: Once | INTRAMUSCULAR | Status: DC
  Filled 2019-02-08: qty 1

## 2019-02-08 MED ORDER — NITROGLYCERIN 0.4 MG SL SUBL: 0.4000 mg | SUBLINGUAL_TABLET | SUBLINGUAL | Status: DC | PRN

## 2019-02-08 MED ORDER — ACETAMINOPHEN 325 MG PO TABS
650.0000 mg | ORAL_TABLET | Freq: Three times a day (TID) | ORAL | Status: DC | PRN
  Administered 2019-02-09: 650 mg via ORAL
  Filled 2019-02-08: qty 2

## 2019-02-08 MED ORDER — FUROSEMIDE 20 MG PO TABS
20.0000 mg | ORAL_TABLET | Freq: Every day | ORAL | Status: DC
  Administered 2019-02-08 – 2019-02-09 (×2): 20 mg via ORAL
  Filled 2019-02-08 (×2): qty 1

## 2019-02-08 MED ORDER — ISOSORBIDE DINITRATE 30 MG PO TABS: 30.0000 mg | ORAL_TABLET | Freq: Every day | ORAL | Status: DC

## 2019-02-08 MED ORDER — LEVOTHYROXINE SODIUM 100 MCG PO TABS
200.0000 ug | ORAL_TABLET | Freq: Every day | ORAL | Status: DC
  Administered 2019-02-09: 05:00:00 200 ug via ORAL
  Filled 2019-02-08: qty 2

## 2019-02-08 MED ORDER — VITAMIN B-12 1000 MCG PO TABS
1000.0000 ug | ORAL_TABLET | Freq: Every day | ORAL | Status: DC
  Administered 2019-02-08 – 2019-02-09 (×2): 1000 ug via ORAL
  Filled 2019-02-08 (×3): qty 1

## 2019-02-08 MED ORDER — PHENYTOIN SODIUM EXTENDED 100 MG PO CAPS: 200.0000 mg | ORAL_CAPSULE | Freq: Every day | ORAL | Status: DC

## 2019-02-08 MED ORDER — VITAMIN C 500 MG PO TABS
500.0000 mg | ORAL_TABLET | Freq: Two times a day (BID) | ORAL | Status: DC
  Administered 2019-02-08 – 2019-02-09 (×2): 500 mg via ORAL
  Filled 2019-02-08 (×2): qty 1

## 2019-02-08 MED ORDER — LORAZEPAM 2 MG/ML IJ SOLN: 2.0000 mg | INTRAMUSCULAR | Status: DC | PRN

## 2019-02-08 MED ORDER — ONDANSETRON HCL 4 MG/2ML IJ SOLN: 4.0000 mg | Freq: Four times a day (QID) | INTRAMUSCULAR | Status: DC | PRN

## 2019-02-08 MED ORDER — PREGABALIN 75 MG PO CAPS
75.0000 mg | ORAL_CAPSULE | Freq: Two times a day (BID) | ORAL | Status: DC
  Administered 2019-02-08 – 2019-02-09 (×2): 75 mg via ORAL
  Filled 2019-02-08 (×2): qty 1

## 2019-02-08 MED ORDER — SENNA 8.6 MG PO TABS
1.0000 | ORAL_TABLET | Freq: Two times a day (BID) | ORAL | Status: DC
  Administered 2019-02-08: 22:00:00 8.6 mg via ORAL
  Filled 2019-02-08 (×6): qty 1

## 2019-02-08 MED ORDER — POLYETHYLENE GLYCOL 3350 17 GM/SCOOP PO POWD
17.0000 g | Freq: Every day | ORAL | Status: DC
  Filled 2019-02-08: qty 255

## 2019-02-08 MED ORDER — FOSFOMYCIN TROMETHAMINE 3 G PO PACK
3.0000 g | PACK | Freq: Once | ORAL | Status: AC
  Administered 2019-02-08: 10:00:00 3 g via ORAL
  Filled 2019-02-08: qty 3

## 2019-02-08 MED ORDER — ONDANSETRON HCL 4 MG PO TABS: 4.0000 mg | ORAL_TABLET | Freq: Three times a day (TID) | ORAL | Status: DC | PRN

## 2019-02-08 MED ORDER — ASPIRIN EC 81 MG PO TBEC: 81.0000 mg | DELAYED_RELEASE_TABLET | Freq: Every day | ORAL | Status: DC

## 2019-02-08 MED ORDER — ESCITALOPRAM OXALATE 10 MG PO TABS: 20.0000 mg | ORAL_TABLET | Freq: Every day | ORAL | Status: DC

## 2019-02-08 MED ORDER — OXYCODONE-ACETAMINOPHEN 5-325 MG PO TABS
1.0000 | ORAL_TABLET | Freq: Two times a day (BID) | ORAL | Status: DC | PRN
  Administered 2019-02-08: 1 via ORAL
  Filled 2019-02-08: qty 1

## 2019-02-08 MED ORDER — ASPIRIN EC 325 MG PO TBEC
325.0000 mg | DELAYED_RELEASE_TABLET | Freq: Every day | ORAL | Status: DC
  Administered 2019-02-08 – 2019-02-09 (×2): 325 mg via ORAL
  Filled 2019-02-08 (×2): qty 1

## 2019-02-08 MED ORDER — APIXABAN 5 MG PO TABS: 5.0000 mg | ORAL_TABLET | Freq: Two times a day (BID) | ORAL | Status: DC

## 2019-02-08 MED ORDER — FERROUS SULFATE 325 (65 FE) MG PO TABS
325.0000 mg | ORAL_TABLET | Freq: Every day | ORAL | Status: DC
  Administered 2019-02-08 – 2019-02-09 (×2): 325 mg via ORAL
  Filled 2019-02-08 (×2): qty 1

## 2019-02-08 MED ORDER — ONDANSETRON HCL 4 MG PO TABS
4.0000 mg | ORAL_TABLET | Freq: Four times a day (QID) | ORAL | Status: DC | PRN
  Administered 2019-02-08: 4 mg via ORAL
  Filled 2019-02-08: qty 1

## 2019-02-08 MED ORDER — LORAZEPAM 0.5 MG PO TABS
0.5000 mg | ORAL_TABLET | Freq: Every day | ORAL | Status: DC
  Administered 2019-02-08: 22:00:00 0.5 mg via ORAL
  Filled 2019-02-08: qty 1

## 2019-02-08 MED ORDER — LACTATED RINGERS IV SOLN
INTRAVENOUS | Status: DC
  Administered 2019-02-08 – 2019-02-09 (×2): via INTRAVENOUS

## 2019-02-08 MED ORDER — FLUTICASONE PROPIONATE 50 MCG/ACT NA SUSP
1.0000 | Freq: Every day | NASAL | Status: DC
  Administered 2019-02-08: 1 via NASAL
  Filled 2019-02-08 (×2): qty 16

## 2019-02-08 MED ORDER — PRAVASTATIN SODIUM 40 MG PO TABS
80.0000 mg | ORAL_TABLET | Freq: Every day | ORAL | Status: DC
  Administered 2019-02-08 – 2019-02-09 (×2): 80 mg via ORAL
  Filled 2019-02-08 (×2): qty 2

## 2019-02-08 MED ORDER — METOPROLOL TARTRATE 25 MG PO TABS
25.0000 mg | ORAL_TABLET | Freq: Two times a day (BID) | ORAL | Status: DC
  Administered 2019-02-08 – 2019-02-09 (×2): 25 mg via ORAL
  Filled 2019-02-08 (×2): qty 1

## 2019-02-08 MED ORDER — LEVETIRACETAM IN NACL 500 MG/100ML IV SOLN
500.0000 mg | Freq: Two times a day (BID) | INTRAVENOUS | Status: DC
  Administered 2019-02-08 – 2019-02-09 (×2): 500 mg via INTRAVENOUS
  Filled 2019-02-08 (×2): qty 100

## 2019-02-08 MED ORDER — HYDROXYZINE HCL 25 MG PO TABS
25.0000 mg | ORAL_TABLET | Freq: Four times a day (QID) | ORAL | Status: DC | PRN
  Administered 2019-02-08: 22:00:00 25 mg via ORAL
  Filled 2019-02-08: qty 1

## 2019-02-08 MED ORDER — SODIUM CHLORIDE 0.9 % IV SOLN
1500.0000 mg | Freq: Once | INTRAVENOUS | Status: AC
  Administered 2019-02-08: 11:00:00 1500 mg via INTRAVENOUS
  Filled 2019-02-08: qty 30

## 2019-02-08 MED ORDER — DULOXETINE HCL 30 MG PO CPEP
30.0000 mg | ORAL_CAPSULE | Freq: Every day | ORAL | Status: DC
  Administered 2019-02-08 – 2019-02-09 (×2): 30 mg via ORAL
  Filled 2019-02-08 (×2): qty 1

## 2019-02-08 MED ORDER — APIXABAN 2.5 MG PO TABS
2.5000 mg | ORAL_TABLET | Freq: Two times a day (BID) | ORAL | Status: DC
  Administered 2019-02-08 – 2019-02-09 (×2): 2.5 mg via ORAL
  Filled 2019-02-08 (×2): qty 1

## 2019-02-08 NOTE — Consult Note (Signed)
[place] A. Merlene Laughter, MD     [email or web address]          Karen Mccann is an 68 y.o. female.   ASSESSMENT/PLAN: 1.  Resolved status epilepticus due to change in antiepileptic medications.  Given the erratic nature of phenytoin, I would recommend switching to Keppra.  The patient will be given 500 mg twice daily.  We will hold the Dilantin since she has had a loading dose few hours ago.  We will continue to follow her progression.  2.  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  It is unclear why she is on anticoagulation.  She does not have evidence of chronic or paroxysmal atrial fibrillation warranting long-term current anticoagulation.  I recommend switching to anti-platelet agents.  She is on 81 mg.  This will be increased to 325.  We can continue with anticoagulation but that DVT prophylaxis dose.  The Eliquis will be reduced to 2.5 twice daily.  3.  Morbid obesity: Consider evaluation for sleep apnea syndrome    Patient is a 68 year old white female who has a history of epilepsy.  She has been on Dilantin 200 mg twice daily this has been well controlled.  She tells me that she has not had a seizure in about 6 years.  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  She appears of had a seizure earlier today and this was followed by 2 other seizures while being in the emergency room.  She was loaded with fosphenytoin 1500 mg PEG and also given Ativan.  She apparently did return to baseline after the second seizure but was unresponsive after the third seizure.  It appears that the patient had a stroke in 2005.  Dr. Nelly Laurence notes indicate that she has been on anticoagulation since then.  She confirms this but no reasons are given.  There is no documentation or history of paroxysmal or chronic atrial fibrillation.  She  does tell me that she is chronically in bed.  She complains of having pain on the left side chronically since her stroke especially recently.  The review of systems otherwise negative.    GENERAL: This is a pleasant obese female in no acute distress.  HEENT: She has a large neck and large tongue.  ABDOMEN: soft  EXTREMITIES: No edema   BACK: Normal  SKIN: Normal by inspection.    MENTAL STATUS: Alert and oriented. Speech, language and cognition are generally intact. Judgment and insight normal.   CRANIAL NERVES: Pupils are equal, round and reactive to light and accomodation; extra ocular movements are full, there is no significant nystagmus; visual fields reveals a dense left homonymous hemianopia; upper and lower facial muscles are normal in strength and symmetric, there is no flattening of the nasolabial folds; tongue is midline  MOTOR: There is a flaccid left hemiplegia.  Right upper extremity is 4+/5 with no drift.  The right leg is 3/5 with normal tone and bulk.  COORDINATION: There is no tremor, dysmetria or myoclonus on the right side.  REFLEXES: Deep tendon reflexes are symmetrical and normal.   SENSATION: Normal to light touch and pain.       Blood pressure 125/61, pulse 72, temperature (!) 97.3 F (36.3 C), temperature source Rectal, resp. rate 17, height 5\' 7"  (1.702 m), weight 115.5 kg, SpO2 95 %.  Past Medical History:  Diagnosis Date   Bipolar 1 disorder, depressed ([place])    COPD (chronic obstructive pulmonary disease) (HCC)    Depression    Gastroesophageal reflux    Heart failure ([place])    Hemiparesis (HCC)    Hemiplegia (HCC)    Seizures (H[facility])    Stroke (HCC)    Thyroid disease     Past Surgical History:  Procedure Laterality Date   CARDIAC SURGERY     COLONOSCOPY WITH PROPOFOL N/A 05/14/2018   Procedure: COLONOSCOPY WITH PROPOFOL;  Surgeon: Rogene Houston, MD;  [facility];  Service: Endoscopy;  Laterality: N/A;    ESOPHAGOGASTRODUODENOSCOPY (EGD) WITH PROPOFOL N/A 05/14/2018   Procedure: ESOPHAGOGASTRODUODENOSCOPY (EGD) WITH PROPOFOL;  Surgeon: Rogene Houston, MD;  Location: [facility];  Service: Endoscopy;  Laterality: N/A;   POLYPECTOMY  05/14/2018   Procedure: POLYPECTOMY;  Surgeon: Rogene Houston, MD;  Location: [facility];  Service: Endoscopy;;  cecal polyp   THYROID SURGERY     68 yrs old    Family History  Problem Relation Age of Onset   Hypertension Other     Social History:  reports that she quit smoking about 15 years ago. Her smoking use included cigarettes. She smoked 0.50 packs per day. She has never used smokeless tobacco. She reports that she does not drink alcohol or use drugs.  Allergies:  Allergies  Allergen Reactions   Latex Other (See Comments)    unknown    Medications: Prior to Admission medications   Medication Sig Start Date End Date Taking? Authorizing Provider  acetaminophen (TYLENOL) 325 MG tablet Take 650 mg by mouth every 8 (eight) hours as needed.   Yes [provider]  apixaban (ELIQUIS) 5 MG TABS tablet Take 1 tablet (5 mg total) by mouth 2 (two) times daily. 05/18/18  Yes Shah, Pratik D, DO  ascorbic acid (VITAMIN C) 500 MG tablet Take 500 mg by mouth 2 (two) times daily.   Yes [provider]  aspirin EC 81 MG tablet Take 81 mg by mouth daily.   Yes [provider]  DULoxetine (CYMBALTA) 30 MG capsule Take 30 mg by mouth daily.    Yes [provider]  escitalopram (LEXAPRO) 20 MG tablet Take 20 mg by mouth daily.   Yes [provider]  ferrous sulfate 325 (65 FE) MG tablet Take 1 tablet (325 mg total) by mouth daily with breakfast. Patient taking differently: Take 325 mg by mouth 2 (two) times daily with a meal.  05/16/18  Yes Manuella Ghazi, Pratik D, DO  fluticasone (FLONASE) 50 MCG/ACT nasal spray Place 1 spray into both nostrils at bedtime.   Yes [provider]  furosemide (LASIX) 20 MG tablet Take 20 mg  by mouth daily.    Yes [provider]  hydrOXYzine (ATARAX/VISTARIL) 25 MG tablet Take 25 mg by mouth every 6 (six) hours as needed for itching.    Yes [provider]  ipratropium (ATROVENT) 0.03 % nasal spray Place 2 sprays into both nostrils every 6 (six) hours as needed for rhinitis.   Yes [provider]  isosorbide dinitrate (ISORDIL) 30 MG tablet Take 30 mg by mouth daily.   Yes [provider]  levothyroxine (SYNTHROID, LEVOTHROID) 200 MCG tablet Take 200 mcg by mouth daily before breakfast.   Yes [provider]  LORazepam (ATIVAN) 0.5 MG tablet Take 0.5 mg by mouth at bedtime.   Yes [provider]  Melatonin 3 MG TABS Take 3 mg  by mouth at bedtime.    Yes [provider]  metoprolol tartrate (LOPRESSOR) 25 MG tablet Take 1 tablet (25 mg total) by mouth 2 (two) times daily. Patient taking differently: Take 25 mg by mouth daily.  05/16/15  Yes Branch, Alphonse Guild, MD  nitroGLYCERIN (NITROSTAT) 0.4 MG SL tablet Place 0.4 mg under the tongue every 5 (five) minutes as needed for chest pain.   Yes [provider]  ondansetron (ZOFRAN) 4 MG tablet Take 4 mg by mouth every 8 (eight) hours as needed for nausea or vomiting.    Yes [provider]  oxyCODONE-acetaminophen (PERCOCET/ROXICET) 5-325 MG tablet Take 1 tablet by mouth every 12 (twelve) hours as needed for moderate pain or severe pain.    Yes [provider]  phenytoin (DILANTIN) 100 MG ER capsule Take 200 mg by mouth daily.    Yes [provider]  polyethylene glycol powder (GLYCOLAX/MIRALAX) powder Take 17 g by mouth daily.   Yes [provider]  pravastatin (PRAVACHOL) 80 MG tablet Take 80 mg by mouth daily.   Yes [provider]  pregabalin (LYRICA) 75 MG capsule Take 75 mg by mouth 2 (two) times daily.    Yes [provider]  senna (SENOKOT) 8.6 MG TABS tablet Take 1 tablet by mouth 2 (two) times daily.   Yes  [provider]  vitamin B-12 (CYANOCOBALAMIN) 1000 MCG tablet Take 1,000 mcg by mouth daily.   Yes [provider]    Scheduled Meds:  apixaban  5 mg Oral BID   aspirin EC  81 mg Oral Daily   DULoxetine  30 mg Oral Daily   escitalopram  20 mg Oral Daily   ferrous sulfate  325 mg Oral Q breakfast   fluticasone  1 spray Each Nare QHS   furosemide  20 mg Oral Daily   isosorbide dinitrate  30 mg Oral Daily   [START ON 02/09/2019] levothyroxine  200 mcg Oral QAC breakfast   LORazepam  0.5 mg Oral QHS   metoprolol tartrate  25 mg Oral BID   [START ON 02/09/2019] phenytoin  200 mg Oral Daily   polyethylene glycol powder  17 g Oral Daily   pravastatin  80 mg Oral Daily   pregabalin  75 mg Oral BID   senna  1 tablet Oral BID   vitamin B-12  1,000 mcg Oral Daily   ascorbic acid  500 mg Oral BID   Continuous Infusions:  lactated ringers     PRN Meds:.acetaminophen, hydrOXYzine, LORazepam, nitroGLYCERIN, ondansetron **OR** ondansetron (ZOFRAN) IV, ondansetron, oxyCODONE-acetaminophen     Results for orders placed or performed during the hospital encounter of 02/08/19 (from the past 48 hour(s))  CBC with Differential     Status: Abnormal   Collection Time: 02/08/19  9:10 AM  Result Value Ref Range   WBC 10.0 4.0 - 10.5 K/uL   RBC 4.07 3.87 - 5.11 MIL/uL   Hemoglobin 11.7 (L) 12.0 - 15.0 g/dL   HCT 38.7 36.0 - 46.0 %   MCV 95.1 80.0 - 100.0 fL   MCH 28.7 26.0 - 34.0 pg   MCHC 30.2 30.0 - 36.0 g/dL   RDW 15.1 11.5 - 15.5 %   Platelets 272 150 - 400 K/uL   nRBC 0.0 0.0 - 0.2 %   Neutrophils Relative % 50 %   Neutro Abs 5.2 1.7 - 7.7 K/uL   Lymphocytes Relative 39 %   Lymphs Abs 3.8 0.7 - 4.0 K/uL   Monocytes  Relative 7 %   Monocytes Absolute 0.7 0.1 - 1.0 K/uL   Eosinophils Relative 2 %   Eosinophils Absolute 0.2 0.0 - 0.5 K/uL   Basophils Relative 1 %   Basophils Absolute 0.1 0.0 - 0.1 K/uL   Immature Granulocytes 1 %   Abs Immature  Granulocytes 0.05 0.00 - 0.07 K/uL    Comment: Performed at [facility], [address] [place], [place] [address]  Comprehensive metabolic panel     Status: Abnormal   Collection Time: 02/08/19  9:10 AM  Result Value Ref Range   Sodium 141 135 - 145 mmol/L   Potassium 3.9 3.5 - 5.1 mmol/L   Chloride 106 98 - 111 mmol/L   CO2 21 (L) 22 - 32 mmol/L   Glucose, Bld 121 (H) 70 - 99 mg/dL   BUN 11 8 - 23 mg/dL   Creatinine, Ser 0.77 0.44 - 1.00 mg/dL   Calcium 8.8 (L) 8.9 - 10.3 mg/dL   Total Protein 7.0 6.5 - 8.1 g/dL   Albumin 3.6 3.5 - 5.0 g/dL   AST 20 15 - 41 U/L   ALT 16 0 - 44 U/L   Alkaline Phosphatase 126 38 - 126 U/L   Total Bilirubin 0.2 (L) 0.3 - 1.2 mg/dL   GFR calc non Af Amer >60 >60 mL/min   GFR calc Af Amer >60 >60 mL/min   Anion gap 14 5 - 15    Comment: Performed at [facility], [address] [place], [place] [address]  Urinalysis, Routine w reflex microscopic     Status: Abnormal   Collection Time: 02/08/19  9:10 AM  Result Value Ref Range   Color, Urine YELLOW YELLOW   APPearance CLEAR CLEAR   Specific Gravity, Urine 1.020 1.005 - 1.030   pH 5.5 5.0 - 8.0   Glucose, UA NEGATIVE NEGATIVE mg/dL   Hgb urine dipstick LARGE (A) NEGATIVE   Bilirubin Urine NEGATIVE NEGATIVE   Ketones, ur NEGATIVE NEGATIVE mg/dL   Protein, ur TRACE (A) NEGATIVE mg/dL   Nitrite POSITIVE (A) NEGATIVE   Leukocytes,Ua MODERATE (A) NEGATIVE    Comment: Performed at [facility], [address] [place], [place] [address]  Lactic acid, plasma     Status: Abnormal   Collection Time: 02/08/19  9:10 AM  Result Value Ref Range   Lactic Acid, Venous 6.2 (HH) 0.5 - 1.9 mmol/L    Comment: CRITICAL RESULT CALLED TO, READ BACK BY AND VERIFIED WITH: WILEY,E AT 9:30AM ON 02/08/19 BY [facility] Performed at [facility], [address], [place], [place] [address]   Phenytoin level, total     Status: Abnormal   Collection Time: 02/08/19  9:10 AM  Result Value Ref Range   Phenytoin Lvl <2.5 (L)  10.0 - 20.0 ug/mL    Comment: Performed at [facility], [address] [place], [place] [address]  Troponin I - Once     Status: None   Collection Time: 02/08/19  9:10 AM  Result Value Ref Range   Troponin I <0.03 <0.03 ng/mL    Comment: Performed at [facility], [address] [place], [place] [address]  Urinalysis, Microscopic (reflex)     Status: Abnormal   Collection Time: 02/08/19  9:10 AM  Result Value Ref Range   RBC / HPF >50 0 - 5 RBC/hpf   WBC, UA >50 0 - 5 WBC/hpf   Bacteria, UA MANY (A) NONE SEEN   Squamous Epithelial / LPF 0-5 0 - 5  Comment: Performed at [facility], [address] [place], [place] [address]  Blood gas, arterial (at [facility])     Status: Abnormal   Collection Time: 02/08/19  9:11 AM  Result Value Ref Range   FIO2 28.00    pH, Arterial 7.179 (LL) 7.350 - 7.450    Comment: CRITICAL RESULT CALLED TO, READ BACK BY AND VERIFIED WITH: WILEY,E AT 11:00AM ON 02/08/19 BY FESTERMAN,C    pCO2 arterial 56.0 (H) 32.0 - 48.0 mmHg   pO2, Arterial 57.3 (L) 83.0 - 108.0 mmHg   Bicarbonate 17.6 (L) 20.0 - 28.0 mmol/L   Acid-base deficit 7.0 (H) 0.0 - 2.0 mmol/L   O2 Saturation 77.8 %   Patient temperature 36.3    Allens test (pass/fail) PASS PASS    Comment: Performed at [facility], [address] [place], [place] [address]  Culture, blood (routine x 2)     Status: None (Preliminary result)   Collection Time: 02/08/19  9:11 AM  Result Value Ref Range   Specimen Description BLOOD LEFT HAND DRAWN BY RN    Special Requests      BOTTLES DRAWN AEROBIC AND ANAEROBIC Blood Culture adequate volume Performed at [facility], [address], [place], [place] [address]    Culture PENDING    Report Status PENDING   Culture, blood (routine x 2)     Status: None (Preliminary result)   Collection Time: 02/08/19  9:49 AM  Result Value Ref Range   Specimen Description BLOOD RIGHT HAND    Special Requests      BOTTLES DRAWN AEROBIC AND ANAEROBIC Blood Culture adequate  volume Performed at [facility], [address] [place], [place] [address]    Culture PENDING    Report Status PENDING   CBG monitoring, ED     Status: Abnormal   Collection Time: 02/08/19  9:51 AM  Result Value Ref Range   Glucose-Capillary 173 (H) 70 - 99 mg/dL    Studies/Results:  HEAD CT FINDINGS: Brain: No evidence of acute infarction, hemorrhage, extra-axial collection, ventriculomegaly, or mass effect. Old right MCA territory infarct with encephalomalacia. Generalized cerebral atrophy. Periventricular white matter low attenuation likely secondary to microangiopathy.  Vascular: Cerebrovascular atherosclerotic calcifications are noted.  Skull: Negative for fracture or focal lesion.  Sinuses/Orbits: Visualized portions of the orbits are unremarkable. Visualized portions of the paranasal sinuses and mastoid air cells are unremarkable.  Other: None.  IMPRESSION: No acute intracranial pathology.      Head CT scan is reviewed in person and shows a large area of hypodensity with encephalomalacia involving the right MCA distribution consistent with a remote infarct.  The entire territory is involved.  No evidence of acute findings are noted.   Donley Harland A. Merlene Laughter, M.D.  Diplomate, Tax adviser of Psychiatry and Neurology ( Neurology). 02/08/2019, 3:24 PM

## 2019-02-08 NOTE — ED Notes (Signed)
CRITICAL VALUE ALERT  Critical Value:  pH 7.179   Date & Time Notied:  02/08/2019 1101  Provider Notified: Dr. Gilford Raid   Orders Received/Actions taken: None yet

## 2019-02-08 NOTE — ED Notes (Signed)
Pt still resting.

## 2019-02-08 NOTE — H&P (Signed)
History and Physical    Karen Mccann BPZ:[id number] DOB: [birth year] DOA: 02/08/2019  PCP: [name], MD   Patient coming from: [facility]  Chief Complaint: Seizure  HPI: Karen Mccann is a 68 y.o. female with medical history significant for CAD with prior CABG, seizure disorder, obesity, hypothyroidism, and prior CVA on Eliquis who presented from her skilled facility unresponsive with low oxygen saturations at approximately 8:45 AM with last known normal at approximately 8:15 AM and breakfast trays were delivered.  Patient is normally awake and alert and was noted to be very agitated upon EMS arrival.  She was noted to be incontinent and normally has no trouble with her urinary continence.  No witnessed seizure activity was actually noted.  No other symptoms of chest pain, shortness of breath, nausea, vomiting, fevers, or chills noted.   ED Course: Vital signs noted to be stable and patient was noted to have nontherapeutic Dilantin levels and therefore this was given ED.  She was also noted to have an episode of a seizure that lasted approximately 1 minute and was witnessed by respiratory therapy.  At that time she was given 1 mg of Ativan prior to her Dilantin.  She is also noted to have a UTI and was given a single dose of fosfomycin.  Dr. Merlene Laughter of neurology was consulted who did not have any further recommendations at this time.  She does have profound lactic acidosis with pH of 7.18 noted on ABG as well as lactic acid level of 6.2.  CT of the head with no acute findings and chest x-ray with stable cardiomegaly no other acute findings noted.  Review of Systems: All others reviewed and otherwise negative.  Past Medical History:  Diagnosis Date  . Bipolar 1 disorder, depressed ([place])   . COPD (chronic obstructive pulmonary disease) ([place])   . Depression   . Gastroesophageal reflux   . Heart failure ([place])   . Hemiparesis ([place])   . Hemiplegia ([place])   . Seizures ([place])   . Stroke  ([place])   . Thyroid disease     Past Surgical History:  Procedure Laterality Date  . CARDIAC SURGERY    . COLONOSCOPY WITH PROPOFOL N/A 05/14/2018   Procedure: COLONOSCOPY WITH PROPOFOL;  Surgeon: Rogene Houston, MD;  [facility];  Service: Endoscopy;  Laterality: N/A;  . ESOPHAGOGASTRODUODENOSCOPY (EGD) WITH PROPOFOL N/A 05/14/2018   Procedure: ESOPHAGOGASTRODUODENOSCOPY (EGD) WITH PROPOFOL;  Surgeon: Rogene Houston, MD;  Location[facility];  Service: Endoscopy;  Laterality: N/A;  . POLYPECTOMY  05/14/2018   Procedure: POLYPECTOMY;  Surgeon: Rogene Houston, MD;  Location: [facility];  Service: Endoscopy;;  cecal polyp  . THYROID SURGERY     68 yrs old     reports that she quit smoking about 15 years ago. Her smoking use included cigarettes. She smoked 0.50 packs per day. She has never used smokeless tobacco. She reports that she does not drink alcohol or use drugs.  Allergies  Allergen Reactions  . Latex Other (See Comments)    unknown    Family History  Problem Relation Age of Onset  . Hypertension Other     Prior to Admission medications   Medication Sig Start Date End Date Taking? Authorizing Provider  acetaminophen (TYLENOL) 325 MG tablet Take 650 mg by mouth every 8 (eight) hours as needed.   Yes [provider]  apixaban (ELIQUIS) 5 MG TABS tablet Take 1 tablet (5 mg total) by mouth  2 (two) times daily. 05/18/18  Yes Makinlee Awwad D, DO  ascorbic acid (VITAMIN C) 500 MG tablet Take 500 mg by mouth 2 (two) times daily.   Yes [provider]  aspirin EC 81 MG tablet Take 81 mg by mouth daily.   Yes [provider]  DULoxetine (CYMBALTA) 30 MG capsule Take 30 mg by mouth daily.    Yes [provider]  escitalopram (LEXAPRO) 20 MG tablet Take 20 mg by mouth daily.   Yes [provider]  ferrous sulfate 325 (65 FE) MG tablet Take 1 tablet (325 mg total) by mouth daily with breakfast. Patient taking differently: Take  325 mg by mouth 2 (two) times daily with a meal.  05/16/18  Yes [name], [name], DO  fluticasone (FLONASE) 50 MCG/ACT nasal spray Place 1 spray into both nostrils at bedtime.   Yes [provider]  furosemide (LASIX) 20 MG tablet Take 20 mg by mouth daily.    Yes [provider]  hydrOXYzine (ATARAX/VISTARIL) 25 MG tablet Take 25 mg by mouth every 6 (six) hours as needed for itching.    Yes [provider]  ipratropium (ATROVENT) 0.03 % nasal spray Place 2 sprays into both nostrils every 6 (six) hours as needed for rhinitis.   Yes [provider]  isosorbide dinitrate (ISORDIL) 30 MG tablet Take 30 mg by mouth daily.   Yes [provider]  levothyroxine (SYNTHROID, LEVOTHROID) 200 MCG tablet Take 200 mcg by mouth daily before breakfast.   Yes [provider]  LORazepam (ATIVAN) 0.5 MG tablet Take 0.5 mg by mouth at bedtime.   Yes [provider]  Melatonin 3 MG TABS Take 3 mg by mouth at bedtime.    Yes [provider]  metoprolol tartrate (LOPRESSOR) 25 MG tablet Take 1 tablet (25 mg total) by mouth 2 (two) times daily. Patient taking differently: Take 25 mg by mouth daily.  05/16/15  Yes Branch, Alphonse Guild, MD  nitroGLYCERIN (NITROSTAT) 0.4 MG SL tablet Place 0.4 mg under the tongue every 5 (five) minutes as needed for chest pain.   Yes [provider]  ondansetron (ZOFRAN) 4 MG tablet Take 4 mg by mouth every 8 (eight) hours as needed for nausea or vomiting.    Yes [provider]  oxyCODONE-acetaminophen (PERCOCET/ROXICET) 5-325 MG tablet Take 1 tablet by mouth every 12 (twelve) hours as needed for moderate pain or severe pain.    Yes [provider]  phenytoin (DILANTIN) 100 MG ER capsule Take 200 mg by mouth daily.    Yes [provider]  polyethylene glycol powder (GLYCOLAX/MIRALAX) powder Take 17 g by mouth daily.   Yes [provider]  pravastatin (PRAVACHOL) 80 MG tablet Take 80  mg by mouth daily.   Yes [provider]  pregabalin (LYRICA) 75 MG capsule Take 75 mg by mouth 2 (two) times daily.    Yes [provider]  senna (SENOKOT) 8.6 MG TABS tablet Take 1 tablet by mouth 2 (two) times daily.   Yes [provider]  vitamin B-12 (CYANOCOBALAMIN) 1000 MCG tablet Take 1,000 mcg by mouth daily.   Yes [provider]    Physical Exam: Vitals:   02/08/19 1230 02/08/19 1300 02/08/19 1333 02/08/19 1400  BP: 129/60 (!) 104/44 (!) 128/46 (!) 105/41  Pulse: 86 81 81 83  Resp: 17 17 17 16   Temp:      TempSrc:      SpO2: 100% 100% 95% 96%  Weight:      Height:        Constitutional: NAD, calm, comfortable Vitals:   02/08/19 1230 02/08/19 1300 02/08/19 1333 02/08/19 1400  BP: 129/60 (!) 104/44 (!) 128/46 (!) 105/41  Pulse: 86 81 81 83  Resp: 17 17 17 16   Temp:      TempSrc:      SpO2: 100% 100% 95% 96%  Weight:      Height:       Eyes: lids and conjunctivae normal ENMT: Mucous membranes are moist.  Neck: normal, supple Respiratory: clear to auscultation bilaterally. Normal respiratory effort. No accessory muscle use.  Cardiovascular: Regular rate and rhythm, no murmurs. No extremity edema. Abdomen: no tenderness, no distention. Bowel sounds positive.  Musculoskeletal:  No joint deformity upper and lower extremities.   Skin: no rashes, lesions, ulcers.  Psychiatric: Normal judgment and insight. Alert and oriented x 3. Normal mood.   Labs on Admission: I have personally reviewed following labs and imaging studies  CBC: Recent Labs  Lab 02/08/19 0910  WBC 10.0  NEUTROABS 5.2  HGB 11.7*  HCT 38.7  MCV 95.1  PLT 540   Basic Metabolic Panel: Recent Labs  Lab 02/08/19 0910  NA 141  K 3.9  CL 106  CO2 21*  GLUCOSE 121*  BUN 11  CREATININE 0.77  CALCIUM 8.8*   GFR: Estimated Creatinine Clearance: 90.9 mL/min (by C-G formula based on SCr of 0.77 mg/dL). Liver Function Tests: Recent Labs  Lab 02/08/19 0910   AST 20  ALT 16  ALKPHOS 126  BILITOT 0.2*  PROT 7.0  ALBUMIN 3.6   No results for input(s): LIPASE, AMYLASE in the last 168 hours. No results for input(s): AMMONIA in the last 168 hours. Coagulation Profile: No results for input(s): INR, PROTIME in the last 168 hours. Cardiac Enzymes: Recent Labs  Lab 02/08/19 0910  TROPONINI <0.03   BNP (last 3 results) No results for input(s): PROBNP in the last 8760 hours. HbA1C: No results for input(s): HGBA1C in the last 72 hours. CBG: Recent Labs  Lab 02/08/19 0951  GLUCAP 173*   Lipid Profile: No results for input(s): CHOL, HDL, LDLCALC, TRIG, CHOLHDL, LDLDIRECT in the last 72 hours. Thyroid Function Tests: No results for input(s): TSH, T4TOTAL, FREET4, T3FREE, THYROIDAB in the last 72 hours. Anemia Panel: No results for input(s): VITAMINB12, FOLATE, FERRITIN, TIBC, IRON, RETICCTPCT in the last 72 hours. Urine analysis:    Component Value Date/Time   COLORURINE YELLOW 02/08/2019 0910   APPEARANCEUR CLEAR 02/08/2019 0910   LABSPEC 1.020 02/08/2019 0910   PHURINE 5.5 02/08/2019 0910   GLUCOSEU NEGATIVE 02/08/2019 0910   HGBUR LARGE (A) 02/08/2019 0910   BILIRUBINUR NEGATIVE 02/08/2019 0910   KETONESUR NEGATIVE 02/08/2019 0910   PROTEINUR TRACE (A) 02/08/2019 0910   NITRITE POSITIVE (A) 02/08/2019 0910   LEUKOCYTESUR MODERATE (A) 02/08/2019 0910    Radiological Exams on Admission: Ct Head Wo Contrast  Result Date: 02/08/2019 CLINICAL DATA:  Altered level of consciousness EXAM: CT HEAD WITHOUT CONTRAST TECHNIQUE: Contiguous axial images were obtained from the base of the skull through the vertex without intravenous contrast. COMPARISON:  None. FINDINGS: Brain: No evidence of acute infarction, hemorrhage, extra-axial collection, ventriculomegaly, or mass effect. Old right MCA territory infarct with encephalomalacia. Generalized cerebral atrophy. Periventricular white matter low attenuation likely secondary to microangiopathy.  Vascular: Cerebrovascular atherosclerotic calcifications are noted. Skull: Negative for fracture or focal lesion. Sinuses/Orbits: Visualized portions of the orbits are unremarkable. Visualized portions of the paranasal  sinuses and mastoid air cells are unremarkable. Other: None. IMPRESSION: No acute intracranial pathology. Electronically Signed   By: Kathreen Devoid   On: 02/08/2019 10:30   Dg Chest Portable 1 View  Result Date: 02/08/2019 CLINICAL DATA:  Hypoxia. EXAM: PORTABLE CHEST 1 VIEW COMPARISON:  Radiograph of May 12, 2018. FINDINGS: Stable cardiomegaly. Sternotomy wires are noted. No pneumothorax or pleural effusion is noted. Both lungs are clear. The visualized skeletal structures are unremarkable. IMPRESSION: No active disease. Electronically Signed   By: Marijo Conception M.D.   On: 02/08/2019 09:31    EKG: Independently reviewed. 113 bpm. ST.  Assessment/Plan Principal Problem:   Status epilepticus ([place]) Active Problems:   Stroke ([place])   Hypothyroid   Seizures ([facility])   CAD (coronary artery disease)   HTN (hypertension)   Anemia    1. Acute encephalopathy secondary to seizures with nontherapeutic Dilantin level.  Patient states that her meds are usually mixed up at the facility and it appears that as a result she had a seizure.  She has been loaded with Dilantin and has been given a dose of Ativan for seizure in the ED.  Appreciate neurology consultation for further medication adjustments as needed and evaluation.  Will monitor in stepdown unit overnight anticipate discharge in a.m. if stable.  Recheck phenytoin levels in a.m. 2. Severe lactic acidosis.  Likely secondary to above.  Patient does not appear septic.  Will recheck levels in a.m. and maintain on IV fluid infusion overnight.  Recheck ABG as well in a.m. 3. UTI.  Fosfomycin given in ED with urine cultures obtained as well as blood cultures.  We will continue to follow.  Will likely recommend repeat dose of fosfomycin to  complete treatment. 4. Hypothyroidism.  Continue home Synthroid. 5. CAD.  Continue statin and aspirin.  Continue metoprolol. 6. Prior CVA.  Continue on Eliquis.  CT findings of head negative and stable. 7. Iron deficiency anemia.  Stable with no overt bleeding noted.  Continue iron supplementation and repeat CBC in a.m.   DVT prophylaxis: Eliquis Code Status: Limited; no intubation, but yes to CPR Family Communication: None at bedside Disposition Plan:Admit for Seizure treatment and UTI Consults called:Neurology Admission status: Inpatient, SDU   Karen Maradiaga D Manuella Ghazi DO [facility] Pager [phone]  If 7PM-7AM, please contact night-coverage [email or web address] Password [facility]  02/08/2019, 2:34 PM

## 2019-02-08 NOTE — ED Notes (Signed)
Pt had a witnessed seizure by RT and another nurse. Ativan given by Shirlean Mylar RN

## 2019-02-08 NOTE — ED Notes (Signed)
Pt resting with equal rise and chest fall  

## 2019-02-08 NOTE — ED Triage Notes (Signed)
Per EMS-Pt last seen normal about 0815 this am when breakfast trays were delivered at [facility]. Pt then found unresponsive with O2 saturation in the 80s at 0845. Per EMS pt is alert and oriented at baseline. Pt alert but combative apon ED arrival.

## 2019-02-08 NOTE — ED Provider Notes (Signed)
[facility] EMERGENCY DEPARTMENT Provider Note   CSN: [id number] Arrival date & time: 02/08/19  0857    History   Chief Complaint Chief Complaint  Patient presents with  . Altered Mental Status    HPI Karen Mccann is a 68 y.o. female.     Pt presents to the ED today with AMS.  History given by EMS.  She lives at a SNF and was normal around 0815 when breakfast trays were delivered.  She was then found unresponsive with an O2 sat in the 80s around 0845.  Per EMS, pt is normally awake and alert.  She is alert upon arrival, but is very agitated and thrashing around.  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  She is on Eliquis for the stroke.  She also has a hx of seizure d/o and is on Dilantin.  She was incontinent upon arrival, but it is unclear if she is normally incontinent.  No hx of seizure activity given by EMS.       Past Medical History:  Diagnosis Date  . Bipolar 1 disorder, depressed ([place])   . COPD (chronic obstructive pulmonary disease) ([place])   . Depression   . Gastroesophageal reflux   . Heart failure ([place])   . Hemiparesis ([place])   . Hemiplegia ([place])   . Seizures ([place])   . Stroke ([place])   . Thyroid disease     Patient Active Problem List   Diagnosis Date Noted  . Anemia 05/12/2018  . Sepsis ([place]) 03/23/2016  . 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 Lower urinary tract infectious disease 09/17/2015  . Stroke ([place]) 05/09/2015  . Hypothyroid 05/09/2015  . Bipolar 1 disorder ([place]) 05/09/2015  . Seizures ([place]) 05/09/2015  . Hyperlipidemia 05/09/2015  . COPD (chronic obstructive pulmonary disease) ([place]) 05/09/2015  . Depression 05/09/2015  . Anxiety 05/09/2015  . General weakness 05/09/2015  . CAD (coronary artery disease) 05/09/2015  . 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 HTN (hypertension) 05/09/2015  . Chronic pain 05/09/2015  . Esophageal reflux disease 05/09/2015  . Other heart disorders in diseases  classified elsewhere 05/09/2015  . Paraplegia ([place]) 05/09/2015    Past Surgical History:  Procedure Laterality Date  . CARDIAC SURGERY    . COLONOSCOPY WITH PROPOFOL N/A 05/14/2018   Procedure: COLONOSCOPY WITH PROPOFOL;  Surgeon: Rogene Houston, MD;  Location: [facility];  Service: Endoscopy;  Laterality: N/A;  . ESOPHAGOGASTRODUODENOSCOPY (EGD) WITH PROPOFOL N/A 05/14/2018   Procedure: ESOPHAGOGASTRODUODENOSCOPY (EGD) WITH PROPOFOL;  Surgeon: Rogene Houston, MD;  Location: [facility];  Service: Endoscopy;  Laterality: N/A;  . POLYPECTOMY  05/14/2018   Procedure: POLYPECTOMY;  Surgeon: Rogene Houston, MD;  Location: [facility];  Service: Endoscopy;;  cecal polyp  . THYROID SURGERY     68 yrs old     OB History   No obstetric history on file.      Home Medications    Prior to Admission medications   Medication Sig Start Date End Date Taking? Authorizing Provider  acetaminophen (TYLENOL) 325 MG tablet Take 650 mg by mouth every 8 (eight) hours as needed.   Yes [provider]  apixaban (ELIQUIS) 5 MG TABS tablet Take 1 tablet (5 mg total) by mouth 2 (two) times daily. 05/18/18  Yes Shah, Pratik D, DO  ascorbic acid (VITAMIN C) 500 MG tablet Take 500 mg by mouth 2 (two) times daily.  Yes [provider]  aspirin EC 81 MG tablet Take 81 mg by mouth daily.   Yes [provider]  DULoxetine (CYMBALTA) 30 MG capsule Take 30 mg by mouth daily.    Yes [provider]  escitalopram (LEXAPRO) 20 MG tablet Take 20 mg by mouth daily.   Yes [provider]  ferrous sulfate 325 (65 FE) MG tablet Take 1 tablet (325 mg total) by mouth daily with breakfast. Patient taking differently: Take 325 mg by mouth 2 (two) times daily with a meal.  05/16/18  Yes Manuella Ghazi, Pratik D, DO  fluticasone (FLONASE) 50 MCG/ACT nasal spray Place 1 spray into both nostrils at bedtime.   Yes [provider]  furosemide (LASIX) 20 MG tablet Take 20 mg by mouth  daily.    Yes [provider]  hydrOXYzine (ATARAX/VISTARIL) 25 MG tablet Take 25 mg by mouth every 6 (six) hours as needed for itching.    Yes [provider]  ipratropium (ATROVENT) 0.03 % nasal spray Place 2 sprays into both nostrils every 6 (six) hours as needed for rhinitis.   Yes [provider]  isosorbide dinitrate (ISORDIL) 30 MG tablet Take 30 mg by mouth daily.   Yes [provider]  levothyroxine (SYNTHROID, LEVOTHROID) 200 MCG tablet Take 200 mcg by mouth daily before breakfast.   Yes [provider]  LORazepam (ATIVAN) 0.5 MG tablet Take 0.5 mg by mouth at bedtime.   Yes [provider]  Melatonin 3 MG TABS Take 3 mg by mouth at bedtime.    Yes [provider]  metoprolol tartrate (LOPRESSOR) 25 MG tablet Take 1 tablet (25 mg total) by mouth 2 (two) times daily. Patient taking differently: Take 25 mg by mouth daily.  05/16/15  Yes Branch, Alphonse Guild, MD  nitroGLYCERIN (NITROSTAT) 0.4 MG SL tablet Place 0.4 mg under the tongue every 5 (five) minutes as needed for chest pain.   Yes [provider]  ondansetron (ZOFRAN) 4 MG tablet Take 4 mg by mouth every 8 (eight) hours as needed for nausea or vomiting.    Yes [provider]  oxyCODONE-acetaminophen (PERCOCET/ROXICET) 5-325 MG tablet Take 1 tablet by mouth every 12 (twelve) hours as needed for moderate pain or severe pain.    Yes [provider]  phenytoin (DILANTIN) 100 MG ER capsule Take 200 mg by mouth daily.    Yes [provider]  polyethylene glycol powder (GLYCOLAX/MIRALAX) powder Take 17 g by mouth daily.   Yes [provider]  pravastatin (PRAVACHOL) 80 MG tablet Take 80 mg by mouth daily.   Yes [provider]  pregabalin (LYRICA) 75 MG capsule Take 75 mg by mouth 2 (two) times daily.    Yes [provider]  senna (SENOKOT) 8.6 MG TABS tablet Take 1 tablet by mouth 2 (two) times daily.   Yes [provider]  vitamin B-12 (CYANOCOBALAMIN) 1000 MCG tablet Take 1,000 mcg by mouth daily.   Yes [provider]    Family History Family History  Problem Relation Age of Onset  . Hypertension Other     Social History Social History   Tobacco Use  . Smoking status: Former Smoker    Packs/day: 0.50    Types: Cigarettes    Last attempt to quit: 05/16/2003    Years since quitting: 15.7  . Smokeless tobacco: Never Used  Substance Use Topics  . Alcohol use: No    Alcohol/week: 0.0 standard drinks  . Drug  use: Never     Allergies   Latex   Review of Systems Review of Systems  Unable to perform ROS: Mental status change     Physical Exam Updated Vital Signs BP (!) 128/46   Pulse 81   Temp (!) 97.3 F (36.3 C) (Rectal)   Resp 17   Ht 5\' 7"  (1.702 m)   Wt 115.5 kg   SpO2 95%   BMI 39.89 kg/m   Physical Exam Vitals signs and nursing note reviewed.  Constitutional:      Appearance: She is obese.  HENT:     Head: Normocephalic and atraumatic.     Right Ear: External ear normal.     Left Ear: External ear normal.     Nose: Nose normal.     Mouth/Throat:     Mouth: Mucous membranes are moist.  Eyes:     Extraocular Movements: Extraocular movements intact.     Conjunctiva/sclera: Conjunctivae normal.     Pupils: Pupils are equal, round, and reactive to light.  Neck:     Musculoskeletal: Normal range of motion and neck supple.  Cardiovascular:     Rate and Rhythm: Regular rhythm. Tachycardia present.     Pulses: Normal pulses.     Heart sounds: Normal heart sounds.  Pulmonary:     Effort: Pulmonary effort is normal.     Breath sounds: Normal breath sounds.  Abdominal:     General: Abdomen is flat. Bowel sounds are normal.     Palpations: Abdomen is soft.  Musculoskeletal: Normal range of motion.  Skin:    General: Skin is warm.     Capillary Refill: Capillary refill takes less than 2 seconds.  Neurological:     Mental Status: She is alert.  She is disoriented and confused.     Comments: Left sided hemiparalysis (chronic)  Psychiatric:        Behavior: Behavior is agitated and combative.      ED Treatments / Results  Labs (all labs ordered are listed, but only abnormal results are displayed) Labs Reviewed  CBC WITH DIFFERENTIAL/PLATELET - Abnormal; Notable for the following components:      Result Value   Hemoglobin 11.7 (*)    All other components within normal limits  COMPREHENSIVE METABOLIC PANEL - Abnormal; Notable for the following components:   CO2 21 (*)    Glucose, Bld 121 (*)    Calcium 8.8 (*)    Total Bilirubin 0.2 (*)    All other components within normal limits  URINALYSIS, ROUTINE W REFLEX MICROSCOPIC - Abnormal; Notable for the following components:   Hgb urine dipstick LARGE (*)    Protein, ur TRACE (*)    Nitrite POSITIVE (*)    Leukocytes,Ua MODERATE (*)    All other components within normal limits  LACTIC ACID, PLASMA - Abnormal; Notable for the following components:   Lactic Acid, Venous 6.2 (*)    All other components within normal limits  BLOOD GAS, ARTERIAL - Abnormal; Notable for the following components:   pH, Arterial 7.179 (*)    pCO2 arterial 56.0 (*)    pO2, Arterial 57.3 (*)    Bicarbonate 17.6 (*)    Acid-base deficit 7.0 (*)    All other components within normal limits  PHENYTOIN LEVEL, TOTAL - Abnormal; Notable for the following components:   Phenytoin Lvl <2.5 (*)    All other components within normal limits  URINALYSIS, MICROSCOPIC (REFLEX) - Abnormal; Notable for the following components:   Bacteria,  UA MANY (*)    All other components within normal limits  CBG MONITORING, ED - Abnormal; Notable for the following components:   Glucose-Capillary 173 (*)    All other components within normal limits  CULTURE, BLOOD (ROUTINE X 2)  CULTURE, BLOOD (ROUTINE X 2)  URINE CULTURE  TROPONIN I    EKG EKG Interpretation  Date/Time:  Monday February 08 2019 09:11:36 EDT  Ventricular Rate:  113 PR Interval:    QRS Duration: 97 QT Interval:  333 QTC Calculation: 457 R Axis:   51 Text Interpretation:  Sinus tachycardia Probable inferior infarct, age indeterminate Since last tracing rate faster Confirmed by Isla Pence [id number]) on 02/08/2019 9:18:53 AM   Radiology Ct Head Wo Contrast  Result Date: 02/08/2019 CLINICAL DATA:  Altered level of consciousness EXAM: CT HEAD WITHOUT CONTRAST TECHNIQUE: Contiguous axial images were obtained from the base of the skull through the vertex without intravenous contrast. COMPARISON:  None. FINDINGS: Brain: No evidence of acute infarction, hemorrhage, extra-axial collection, ventriculomegaly, or mass effect. Old right MCA territory infarct with encephalomalacia. Generalized cerebral atrophy. Periventricular white matter low attenuation likely secondary to microangiopathy. Vascular: Cerebrovascular atherosclerotic calcifications are noted. Skull: Negative for fracture or focal lesion. Sinuses/Orbits: Visualized portions of the orbits are unremarkable. Visualized portions of the paranasal sinuses and mastoid air cells are unremarkable. Other: None. IMPRESSION: No acute intracranial pathology. Electronically Signed   By: Kathreen Devoid   On: 02/08/2019 10:30   Dg Chest Portable 1 View  Result Date: 02/08/2019 CLINICAL DATA:  Hypoxia. EXAM: PORTABLE CHEST 1 VIEW COMPARISON:  Radiograph of May 12, 2018. FINDINGS: Stable cardiomegaly. Sternotomy wires are noted. No pneumothorax or pleural effusion is noted. Both lungs are clear. The visualized skeletal structures are unremarkable. IMPRESSION: No active disease. Electronically Signed   By: Marijo Conception M.D.   On: 02/08/2019 09:31    Procedures Procedures (including critical care time)  Medications Ordered in ED Medications  LORazepam (ATIVAN) injection 1 mg (0 mg Intravenous Hold 02/08/19 0929)  sodium chloride 0.9 % bolus 1,000 mL (0 mLs Intravenous Stopped 02/08/19 1101)   fosfomycin (MONUROL) packet 3 g (3 g Oral Given 02/08/19 0945)  fosPHENYtoin (CEREBYX) 1,500 mg PE in sodium chloride 0.9 % 50 mL IVPB (0 mg PE Intravenous Stopped 02/08/19 1101)  LORazepam (ATIVAN) injection 1 mg (1 mg Intravenous Given 02/08/19 1032)     Initial Impression / Assessment and Plan / ED Course  I have reviewed the triage vital signs and the nursing notes.  Pertinent labs & imaging results that were available during my care of the patient were reviewed by me and considered in my medical decision making (see chart for details).        Pt is now oriented.  She feels like she had a seizure.  Lactic acid is elevated c/w seizure.  She is afebrile and bp ok.  Her dilantin level is less than 2.4, so she is given a dose of Cerebyx.  She said they mess her meds up all the time at the facility.  She does have a UTI with a hx of multidrug resistant organisms.  She is given a single dose of fosfomycin.    Pt was in the room with RT when she told RT that she was about to have a seizure, then she did.  It lasted about 1 minute and was gone by the time I arrived in the room.  She was given 1 mg of ativan after seizure.  She  had not had the Cerebyx yet as pharmacy had to mix it.  Pt is still asleep, but has not had any more seizures.  She was d/w Dr. Merlene Laughter who does not recommend anything additional.  Pt d/w Dr. Manuella Ghazi (triad) who will admit for obs.  Final Clinical Impressions(s) / ED Diagnoses   Final diagnoses:  Acute cystitis without hematuria  MDRO (multiple drug resistant organisms) resistance  Seizure ([place])  Subtherapeutic serum dilantin level    ED Discharge Orders    None       Isla Pence, MD 02/08/19 1430

## 2019-02-08 NOTE — ED Notes (Signed)
Have placed a towel next to patients face to keep her head to side.

## 2019-02-08 NOTE — ED Notes (Signed)
CRITICAL VALUE ALERT  Critical Value:  Lactic 6.2   Date & Time Notied:  02/08/2019 6815  Provider Notified: Dr. Gilford Raid   Orders Received/Actions taken: None yet

## 2019-02-08 NOTE — ED Notes (Signed)
Pt is now completely alert and oriented. Stated "I have seizures"

## 2019-02-08 NOTE — ED Notes (Signed)
Pt still resting. No new seizure activity

## 2019-02-08 NOTE — ED Notes (Signed)
Pharmacy is bringing down Cerebyx

## 2019-02-08 NOTE — ED Notes (Signed)
Pt to CT

## 2019-02-09 DIAGNOSIS — R569 Unspecified convulsions: Secondary | ICD-10-CM | POA: Diagnosis not present

## 2019-02-09 DIAGNOSIS — G40901 Epilepsy, unspecified, not intractable, with status epilepticus: Secondary | ICD-10-CM | POA: Diagnosis not present

## 2019-02-09 LAB — BLOOD GAS, ARTERIAL
Acid-Base Excess: 3.3 mmol/L — ABNORMAL HIGH (ref 0.0–2.0)
Bicarbonate: 27.1 mmol/L (ref 20.0–28.0)
Drawn by: 21310
FIO2: 21
O2 Saturation: 93.2 %
Patient temperature: 37
pCO2 arterial: 43.9 mmHg (ref 32.0–48.0)
pH, Arterial: 7.414 (ref 7.350–7.450)
pO2, Arterial: 69 mmHg — ABNORMAL LOW (ref 83.0–108.0)

## 2019-02-09 LAB — BASIC METABOLIC PANEL
Anion gap: 7 (ref 5–15)
BUN: 8 mg/dL (ref 8–23)
CO2: 25 mmol/L (ref 22–32)
Calcium: 8.4 mg/dL — ABNORMAL LOW (ref 8.9–10.3)
Chloride: 110 mmol/L (ref 98–111)
Creatinine, Ser: 0.57 mg/dL (ref 0.44–1.00)
GFR calc Af Amer: >60 mL/min (ref >60)
GFR calc non Af Amer: >60 mL/min (ref >60)
Glucose, Bld: 91 mg/dL (ref 70–99)
Potassium: 3.6 mmol/L (ref 3.5–5.1)
Sodium: 142 mmol/L (ref 135–145)

## 2019-02-09 LAB — MRSA PCR SCREENING: MRSA by PCR: NEGATIVE

## 2019-02-09 LAB — CBC
HCT: 32.8 % — ABNORMAL LOW (ref 36.0–46.0)
Hemoglobin: 9.7 g/dL — ABNORMAL LOW (ref 12.0–15.0)
MCH: 28.3 pg (ref 26.0–34.0)
MCHC: 29.6 g/dL — ABNORMAL LOW (ref 30.0–36.0)
MCV: 95.6 fL (ref 80.0–100.0)
Platelets: 224 10*3/uL (ref 150–400)
RBC: 3.43 MIL/uL — ABNORMAL LOW (ref 3.87–5.11)
RDW: 14.9 % (ref 11.5–15.5)
WBC: 8.6 10*3/uL (ref 4.0–10.5)
nRBC: 0 % (ref 0.0–0.2)

## 2019-02-09 LAB — LACTIC ACID, PLASMA: Lactic Acid, Venous: 1.1 mmol/L (ref 0.5–1.9)

## 2019-02-09 LAB — PHENYTOIN LEVEL, TOTAL: Phenytoin Lvl: 8.4 ug/mL — ABNORMAL LOW (ref 10.0–20.0)

## 2019-02-09 MED ORDER — APIXABAN 2.5 MG PO TABS: 2.5000 mg | ORAL_TABLET | Freq: Two times a day (BID) | ORAL | 2 refills | Status: DC

## 2019-02-09 MED ORDER — ASPIRIN 325 MG PO TBEC: 325.0000 mg | DELAYED_RELEASE_TABLET | Freq: Every day | ORAL | 0 refills | Status: DC

## 2019-02-09 MED ORDER — LEVETIRACETAM 500 MG PO TABS: 500.0000 mg | ORAL_TABLET | Freq: Two times a day (BID) | ORAL | 0 refills | Status: AC

## 2019-02-09 MED ORDER — FOSFOMYCIN TROMETHAMINE 3 G PO PACK: 3.0000 g | PACK | Freq: Once | ORAL | 0 refills | Status: AC

## 2019-02-09 NOTE — Progress Notes (Signed)
Patient discharged to [facility] facility. Patient alert and oriented and had voided before transfer. All paperwork given to transport team and called report to Nursing facility.

## 2019-02-09 NOTE — Discharge Summary (Signed)
Physician Discharge Summary  Karen Mccann TDD:[id number] DOB: 17-Dec-1950 DOA: 02/08/2019  PCP: [name], MD  Admit date: 02/08/2019  Discharge date: 02/09/2019  Admitted From:SNF  Disposition:  SNF  Recommendations for Outpatient Follow-up:  1. Follow up with PCP in 1-2 weeks 2. Discontinued Dilantin and will continue on Keppra twice daily as prescribed 3. Aspirin increased to 325 mg daily and Eliquis now 2.5 mg twice daily 4. Repeat dose of fosfomycin prescribed on 4/30, please follow-up urine cultures 5. Recommend sleep apnea screening in the near future  Home Health: None  Equipment/Devices: None  Discharge Condition: Stable  CODE STATUS: Partial, no intubation  Diet recommendation: Heart healthy  Brief/Interim Summary: Per HPI: Karen Mccann is a 68 y.o. female with medical history significant for CAD with prior CABG, seizure disorder, obesity, hypothyroidism, and prior CVA on Eliquis who presented from her skilled facility unresponsive with low oxygen saturations at approximately 8:45 AM with last known normal at approximately 8:15 AM and breakfast trays were delivered.  Patient is normally awake and alert and was noted to be very agitated upon EMS arrival.  She was noted to be incontinent and normally has no trouble with her urinary continence.  No witnessed seizure activity was actually noted.  No other symptoms of chest pain, shortness of breath, nausea, vomiting, fevers, or chills noted.  Patient was admitted with acute encephalopathy secondary to status epilepticus with nontherapeutic Dilantin levels.  She was loaded with IV Dilantin in the ED and was also noted to have an episode of seizure there for which she was given some Ativan.  She has remained seizure-free overnight and has been seen by neurology with recommendations to now continue on Keppra and she has received an IV dose already.  No other acute events otherwise noted and she appears to be back to her  baseline.  She was also noted to have a UTI with culture still pending, but was given fosfomycin in the ED and we recommend repeat dose on 4/30.  Please follow-up repeat cultures at this time.  Additionally, she was noted to have severe lactic acidosis which has now resolved.  Medications have been adjusted and no other acute events have been noted during the course of this admission.  She is stable for discharge back to SNF at this time.  Discharge Diagnoses:  Principal Problem:   Status epilepticus ([place]) Active Problems:   Stroke ([place])   Hypothyroid   Seizures ([facility])   CAD (coronary artery disease)   HTN (hypertension)   Anemia  Principal discharge diagnosis: Acute encephalopathy secondary to status epilepticus.  Discharge Instructions  Discharge Instructions    Diet - low sodium heart healthy   Complete by:  As directed    Increase activity slowly   Complete by:  As directed      Allergies as of 02/09/2019      Reactions   Latex Other (See Comments)   unknown      Medication List    STOP taking these medications   phenytoin 100 MG ER capsule Commonly known as:  DILANTIN     TAKE these medications   acetaminophen 325 MG tablet Commonly known as:  TYLENOL Take 650 mg by mouth every 8 (eight) hours as needed.   apixaban 2.5 MG Tabs tablet Commonly known as:  ELIQUIS Take 1 tablet (2.5 mg total) by mouth 2 (two) times daily for 30 days. What changed:    medication strength  how much to take  ascorbic acid 500 MG tablet Commonly known as:  VITAMIN C Take 500 mg by mouth 2 (two) times daily.   aspirin 325 MG EC tablet Take 1 tablet (325 mg total) by mouth daily. What changed:    medication strength  how much to take   DULoxetine 30 MG capsule Commonly known as:  CYMBALTA Take 30 mg by mouth daily.   escitalopram 20 MG tablet Commonly known as:  LEXAPRO Take 20 mg by mouth daily.   ferrous sulfate 325 (65 FE) MG tablet Take 1 tablet (325 mg total) by  mouth daily with breakfast. What changed:  when to take this   fluticasone 50 MCG/ACT nasal spray Commonly known as:  FLONASE Place 1 spray into both nostrils at bedtime.   fosfomycin 3 g Pack Commonly known as:  MONUROL Take 3 g by mouth once for 1 dose. Start taking on:  February 11, 2019   furosemide 20 MG tablet Commonly known as:  LASIX Take 20 mg by mouth daily.   hydrOXYzine 25 MG tablet Commonly known as:  ATARAX/VISTARIL Take 25 mg by mouth every 6 (six) hours as needed for itching.   ipratropium 0.03 % nasal spray Commonly known as:  ATROVENT Place 2 sprays into both nostrils every 6 (six) hours as needed for rhinitis.   isosorbide dinitrate 30 MG tablet Commonly known as:  ISORDIL Take 30 mg by mouth daily.   levETIRAcetam 500 MG tablet Commonly known as:  Keppra Take 1 tablet (500 mg total) by mouth 2 (two) times daily for 30 days.   levothyroxine 200 MCG tablet Commonly known as:  SYNTHROID Take 200 mcg by mouth daily before breakfast.   LORazepam 0.5 MG tablet Commonly known as:  ATIVAN Take 0.5 mg by mouth at bedtime.   Melatonin 3 MG Tabs Take 3 mg by mouth at bedtime.   metoprolol tartrate 25 MG tablet Commonly known as:  LOPRESSOR Take 1 tablet (25 mg total) by mouth 2 (two) times daily. What changed:  when to take this   nitroGLYCERIN 0.4 MG SL tablet Commonly known as:  NITROSTAT Place 0.4 mg under the tongue every 5 (five) minutes as needed for chest pain.   ondansetron 4 MG tablet Commonly known as:  ZOFRAN Take 4 mg by mouth every 8 (eight) hours as needed for nausea or vomiting.   oxyCODONE-acetaminophen 5-325 MG tablet Commonly known as:  PERCOCET/ROXICET Take 1 tablet by mouth every 12 (twelve) hours as needed for moderate pain or severe pain.   polyethylene glycol powder 17 GM/SCOOP powder Commonly known as:  GLYCOLAX/MIRALAX Take 17 g by mouth daily.   pravastatin 80 MG tablet Commonly known as:  PRAVACHOL Take 80 mg by mouth  daily.   pregabalin 75 MG capsule Commonly known as:  LYRICA Take 75 mg by mouth 2 (two) times daily.   senna 8.6 MG Tabs tablet Commonly known as:  SENOKOT Take 1 tablet by mouth 2 (two) times daily.   vitamin B-12 1000 MCG tablet Commonly known as:  CYANOCOBALAMIN Take 1,000 mcg by mouth daily.      Follow-up Information    [name], MD Follow up in 1 week(s).   Specialty:  Internal Medicine Contact information: [place] [phone]          Allergies  Allergen Reactions  . Latex Other (See Comments)    unknown    Consultations:  Neurology   Procedures/Studies: Ct Head Wo Contrast  Result Date:  02/08/2019 CLINICAL DATA:  Altered level of consciousness EXAM: CT HEAD WITHOUT CONTRAST TECHNIQUE: Contiguous axial images were obtained from the base of the skull through the vertex without intravenous contrast. COMPARISON:  None. FINDINGS: Brain: No evidence of acute infarction, hemorrhage, extra-axial collection, ventriculomegaly, or mass effect. Old right MCA territory infarct with encephalomalacia. Generalized cerebral atrophy. Periventricular white matter low attenuation likely secondary to microangiopathy. Vascular: Cerebrovascular atherosclerotic calcifications are noted. Skull: Negative for fracture or focal lesion. Sinuses/Orbits: Visualized portions of the orbits are unremarkable. Visualized portions of the paranasal sinuses and mastoid air cells are unremarkable. Other: None. IMPRESSION: No acute intracranial pathology. Electronically Signed   By: Kathreen Devoid   On: 02/08/2019 10:30   Dg Chest Portable 1 View  Result Date: 02/08/2019 CLINICAL DATA:  Hypoxia. EXAM: PORTABLE CHEST 1 VIEW COMPARISON:  Radiograph of May 12, 2018. FINDINGS: Stable cardiomegaly. Sternotomy wires are noted. No pneumothorax or pleural effusion is noted. Both lungs are clear. The visualized skeletal structures are unremarkable. IMPRESSION: No  active disease. Electronically Signed   By: Marijo Conception M.D.   On: 02/08/2019 09:31      Discharge Exam: Vitals:   02/09/19 0400 02/09/19 0500  BP: 107/62 112/64  Pulse: 83 82  Resp: (!) 26 18  Temp: 99.1 F (37.3 C)   SpO2: 98% 97%   Vitals:   02/09/19 0200 02/09/19 0300 02/09/19 0400 02/09/19 0500  BP: (!) 87/77 91/68 107/62 112/64  Pulse: 87 88 83 82  Resp: (!) 21 12 (!) 26 18  Temp:   99.1 F (37.3 C)   TempSrc:   Rectal   SpO2: 95% 96% 98% 97%  Weight:    114.6 kg  Height:        General: Pt is alert, awake, not in acute distress Cardiovascular: RRR, S1/S2 +, no rubs, no gallops Respiratory: CTA bilaterally, no wheezing, no rhonchi Abdominal: Soft, NT, ND, bowel sounds + Extremities: no edema, no cyanosis    The results of significant diagnostics from this hospitalization (including imaging, microbiology, ancillary and laboratory) are listed below for reference.     Microbiology: Recent Results (from the past 240 hour(s))  Culture, blood (routine x 2)     Status: None (Preliminary result)   Collection Time: 02/08/19  9:11 AM  Result Value Ref Range Status   Specimen Description BLOOD LEFT HAND DRAWN BY RN  Final   Special Requests   Final    BOTTLES DRAWN AEROBIC AND ANAEROBIC Blood Culture adequate volume Performed at [facility], [address] [place], [place] [address]    Culture PENDING  Incomplete   Report Status PENDING  Incomplete  Culture, blood (routine x 2)     Status: None (Preliminary result)   Collection Time: 02/08/19  9:49 AM  Result Value Ref Range Status   Specimen Description BLOOD RIGHT HAND  Final   Special Requests   Final    BOTTLES DRAWN AEROBIC AND ANAEROBIC Blood Culture adequate volume Performed at [facility], [address], [place], [place] [address]    Culture PENDING  Incomplete   Report Status PENDING  Incomplete     Labs: BNP (last 3 results) No results for input(s): BNP in the last 8760 hours. Basic  Metabolic Panel: Recent Labs  Lab 02/08/19 0910 02/09/19 0424  NA 141 142  K 3.9 3.6  CL 106 110  CO2 21* 25  GLUCOSE 121* 91  BUN 11 8  CREATININE 0.77 0.57  CALCIUM 8.8* 8.4*  Liver Function Tests: Recent Labs  Lab 02/08/19 0910  AST 20  ALT 16  ALKPHOS 126  BILITOT 0.2*  PROT 7.0  ALBUMIN 3.6   No results for input(s): LIPASE, AMYLASE in the last 168 hours. No results for input(s): AMMONIA in the last 168 hours. CBC: Recent Labs  Lab 02/08/19 0910 02/09/19 0424  WBC 10.0 8.6  NEUTROABS 5.2  --   HGB 11.7* 9.7*  HCT 38.7 32.8*  MCV 95.1 95.6  PLT 272 224   Cardiac Enzymes: Recent Labs  Lab 02/08/19 0910  TROPONINI <0.03   BNP: Invalid input(s): POCBNP CBG: Recent Labs  Lab 02/08/19 0951  GLUCAP 173*   D-Dimer No results for input(s): DDIMER in the last 72 hours. Hgb A1c No results for input(s): HGBA1C in the last 72 hours. Lipid Profile No results for input(s): CHOL, HDL, LDLCALC, TRIG, CHOLHDL, LDLDIRECT in the last 72 hours. Thyroid function studies No results for input(s): TSH, T4TOTAL, T3FREE, THYROIDAB in the last 72 hours.  Invalid input(s): FREET3 Anemia work up No results for input(s): VITAMINB12, FOLATE, FERRITIN, TIBC, IRON, RETICCTPCT in the last 72 hours. Urinalysis    Component Value Date/Time   COLORURINE YELLOW 02/08/2019 0910   APPEARANCEUR CLEAR 02/08/2019 0910   LABSPEC 1.020 02/08/2019 0910   PHURINE 5.5 02/08/2019 0910   GLUCOSEU NEGATIVE 02/08/2019 0910   HGBUR LARGE (A) 02/08/2019 0910   BILIRUBINUR NEGATIVE 02/08/2019 0910   KETONESUR NEGATIVE 02/08/2019 0910   PROTEINUR TRACE (A) 02/08/2019 0910   NITRITE POSITIVE (A) 02/08/2019 0910   LEUKOCYTESUR MODERATE (A) 02/08/2019 0910   Sepsis Labs Invalid input(s): PROCALCITONIN,  WBC,  LACTICIDVEN Microbiology Recent Results (from the past 240 hour(s))  Culture, blood (routine x 2)     Status: None (Preliminary result)   Collection Time: 02/08/19  9:11 AM   Result Value Ref Range Status   Specimen Description BLOOD LEFT HAND DRAWN BY RN  Final   Special Requests   Final    BOTTLES DRAWN AEROBIC AND ANAEROBIC Blood Culture adequate volume Performed at [facility], [address] [place], [place] [address]    Culture PENDING  Incomplete   Report Status PENDING  Incomplete  Culture, blood (routine x 2)     Status: None (Preliminary result)   Collection Time: 02/08/19  9:49 AM  Result Value Ref Range Status   Specimen Description BLOOD RIGHT HAND  Final   Special Requests   Final    BOTTLES DRAWN AEROBIC AND ANAEROBIC Blood Culture adequate volume Performed at [facility], [address] [place], [place] [address]    Culture PENDING  Incomplete   Report Status PENDING  Incomplete     Time coordinating discharge: 35 minutes  SIGNED:   Rodena Goldmann, DO [facility] 02/09/2019, 7:25 AM  If 7PM-7AM, please contact night-coverage [email or web address] Password [email or web address]

## 2019-02-09 NOTE — Clinical Social Work Note (Signed)
Spoke to patient about returning to [name].  She confirmed she plans to return, and also that she sees an in-house Dr there.  Contacted Debbie with [facility], and requested pt see Dr Judi Cong within the next two weeks.

## 2019-02-10 LAB — URINE CULTURE: Culture: >=100000 — AB

## 2019-02-13 LAB — CULTURE, BLOOD (ROUTINE X 2)
Culture: NO GROWTH
Culture: NO GROWTH
Special Requests: ADEQUATE
Special Requests: ADEQUATE

## 2019-10-19 ENCOUNTER — Other Ambulatory Visit: Payer: Self-pay

## 2019-10-19 ENCOUNTER — Encounter (HOSPITAL_COMMUNITY): Payer: Self-pay | Admitting: *Deleted

## 2019-10-19 ENCOUNTER — Observation Stay (HOSPITAL_COMMUNITY)
Admission: EM | Admit: 2019-10-19 | Discharge: 2019-10-20 | Disposition: A | Discharge status: Home / Self Care | Payer: Medicare Other | Attending: Internal Medicine | Admitting: Internal Medicine

## 2019-10-19 ENCOUNTER — Emergency Department (HOSPITAL_COMMUNITY): Payer: Medicare Other

## 2019-10-19 DIAGNOSIS — Z7982 Long term (current) use of aspirin: Secondary | ICD-10-CM | POA: Diagnosis not present

## 2019-10-19 DIAGNOSIS — J449 Chronic obstructive pulmonary disease, unspecified: Secondary | ICD-10-CM | POA: Diagnosis not present

## 2019-10-19 DIAGNOSIS — D649 Anemia, unspecified: Secondary | ICD-10-CM | POA: Diagnosis present

## 2019-10-19 DIAGNOSIS — U071 COVID-19: Secondary | ICD-10-CM | POA: Diagnosis not present

## 2019-10-19 DIAGNOSIS — D509 Iron deficiency anemia, unspecified: Secondary | ICD-10-CM | POA: Diagnosis not present

## 2019-10-19 DIAGNOSIS — Z79899 Other long term (current) drug therapy: Secondary | ICD-10-CM | POA: Insufficient documentation

## 2019-10-19 DIAGNOSIS — E876 Hypokalemia: Secondary | ICD-10-CM | POA: Diagnosis present

## 2019-10-19 DIAGNOSIS — Z87891 Personal history of nicotine dependence: Secondary | ICD-10-CM | POA: Diagnosis not present

## 2019-10-19 DIAGNOSIS — Z91040 Latex allergy status: Secondary | ICD-10-CM | POA: Diagnosis not present

## 2019-10-19 DIAGNOSIS — E039 Hypothyroidism, unspecified: Secondary | ICD-10-CM | POA: Diagnosis present

## 2019-10-19 DIAGNOSIS — I10 Essential (primary) hypertension: Secondary | ICD-10-CM | POA: Diagnosis not present

## 2019-10-19 DIAGNOSIS — Z5189 Encounter for other specified aftercare: Secondary | ICD-10-CM

## 2019-10-19 HISTORY — DX: Phonological disorder: F80.0

## 2019-10-19 HISTORY — DX: Unspecified convulsions: R56.9

## 2019-10-19 HISTORY — DX: Major depressive disorder, single episode, unspecified: F32.9

## 2019-10-19 HISTORY — DX: Iron deficiency anemia, unspecified: D50.9

## 2019-10-19 HISTORY — DX: Other disorders of plasma-protein metabolism, not elsewhere classified: E88.09

## 2019-10-19 HISTORY — DX: Atherosclerotic heart disease of native coronary artery without angina pectoris: I25.10

## 2019-10-19 HISTORY — DX: Gastro-esophageal reflux disease without esophagitis: K21.9

## 2019-10-19 HISTORY — DX: Personal history of other diseases of the circulatory system: Z86.79

## 2019-10-19 HISTORY — DX: Chronic pain syndrome: G89.4

## 2019-10-19 HISTORY — DX: Heart failure, unspecified: I50.9

## 2019-10-19 HISTORY — DX: Herpesviral infection, unspecified: B00.9

## 2019-10-19 HISTORY — DX: Long term (current) use of anticoagulants: Z79.01

## 2019-10-19 HISTORY — DX: Hyperlipidemia, unspecified: E78.5

## 2019-10-19 HISTORY — DX: Hypothyroidism, unspecified: E03.9

## 2019-10-19 HISTORY — DX: Vitamin D deficiency, unspecified: E55.9

## 2019-10-19 HISTORY — DX: Presence of aortocoronary bypass graft: Z95.1

## 2019-10-19 HISTORY — DX: Paraplegia, unspecified: G82.20

## 2019-10-19 HISTORY — DX: COVID-19: U07.1

## 2019-10-19 HISTORY — DX: Anxiety disorder, unspecified: F41.9

## 2019-10-19 HISTORY — DX: Opioid dependence, uncomplicated: F11.20

## 2019-10-19 HISTORY — DX: Essential (primary) hypertension: I10

## 2019-10-19 LAB — CBC WITH DIFFERENTIAL/PLATELET
Abs Immature Granulocytes: 0.02 10*3/uL (ref 0.00–0.07)
Basophils Absolute: 0 10*3/uL (ref 0.0–0.1)
Basophils Relative: 0 %
Eosinophils Absolute: 0.1 10*3/uL (ref 0.0–0.5)
Eosinophils Relative: 2 %
HCT: 23 % — ABNORMAL LOW (ref 36.0–46.0)
Hemoglobin: 5.6 g/dL — CL (ref 12.0–15.0)
Immature Granulocytes: 1 %
Lymphocytes Relative: 34 %
Lymphs Abs: 1 10*3/uL (ref 0.7–4.0)
MCH: 18.9 pg — ABNORMAL LOW (ref 26.0–34.0)
MCHC: 24.3 g/dL — ABNORMAL LOW (ref 30.0–36.0)
MCV: 77.4 fL — ABNORMAL LOW (ref 80.0–100.0)
Monocytes Absolute: 0.3 10*3/uL (ref 0.1–1.0)
Monocytes Relative: 8 %
Neutro Abs: 1.6 10*3/uL — ABNORMAL LOW (ref 1.7–7.7)
Neutrophils Relative %: 55 %
Platelets: 220 10*3/uL (ref 150–400)
RBC: 2.97 MIL/uL — ABNORMAL LOW (ref 3.87–5.11)
RDW: 19.8 % — ABNORMAL HIGH (ref 11.5–15.5)
WBC: 3 10*3/uL — ABNORMAL LOW (ref 4.0–10.5)
nRBC: 1.7 % — ABNORMAL HIGH (ref 0.0–0.2)

## 2019-10-19 LAB — BASIC METABOLIC PANEL
Anion gap: 6 (ref 5–15)
BUN: 12 mg/dL (ref 8–23)
CO2: 30 mmol/L (ref 22–32)
Calcium: 7.7 mg/dL — ABNORMAL LOW (ref 8.9–10.3)
Chloride: 108 mmol/L (ref 98–111)
Creatinine, Ser: 0.78 mg/dL (ref 0.44–1.00)
GFR calc Af Amer: >60 mL/min (ref >60)
GFR calc non Af Amer: >60 mL/min (ref >60)
Glucose, Bld: 130 mg/dL — ABNORMAL HIGH (ref 70–99)
Potassium: 3.4 mmol/L — ABNORMAL LOW (ref 3.5–5.1)
Sodium: 144 mmol/L (ref 135–145)

## 2019-10-19 LAB — IRON AND TIBC
Iron: 10 ug/dL — ABNORMAL LOW (ref 28–170)
Saturation Ratios: 3 % — ABNORMAL LOW (ref 10.4–31.8)
TIBC: 366 ug/dL (ref 250–450)
UIBC: 356 ug/dL

## 2019-10-19 LAB — HEPATIC FUNCTION PANEL
ALT: 11 U/L (ref 0–44)
AST: 15 U/L (ref 15–41)
Albumin: 2.7 g/dL — ABNORMAL LOW (ref 3.5–5.0)
Alkaline Phosphatase: 56 U/L (ref 38–126)
Bilirubin, Direct: 0.1 mg/dL (ref 0.0–0.2)
Indirect Bilirubin: 0.4 mg/dL (ref 0.3–0.9)
Total Bilirubin: 0.5 mg/dL (ref 0.3–1.2)
Total Protein: 6.1 g/dL — ABNORMAL LOW (ref 6.5–8.1)

## 2019-10-19 LAB — MAGNESIUM: Magnesium: 2.1 mg/dL (ref 1.7–2.4)

## 2019-10-19 LAB — PROTIME-INR
INR: 1.1 (ref 0.8–1.2)
Prothrombin Time: 13.7 seconds (ref 11.4–15.2)

## 2019-10-19 LAB — FERRITIN: Ferritin: 6 ng/mL — ABNORMAL LOW (ref 11–307)

## 2019-10-19 LAB — PREPARE RBC (CROSSMATCH)

## 2019-10-19 LAB — POC OCCULT BLOOD, ED: Fecal Occult Bld: NEGATIVE

## 2019-10-19 MED ORDER — PANTOPRAZOLE SODIUM 40 MG IV SOLR
40.0000 mg | Freq: Two times a day (BID) | INTRAVENOUS | Status: DC
  Administered 2019-10-20 (×2): 40 mg via INTRAVENOUS
  Filled 2019-10-19 (×2): qty 40

## 2019-10-19 MED ORDER — POTASSIUM CHLORIDE 10 MEQ/100ML IV SOLN
10.0000 meq | INTRAVENOUS | Status: AC
  Administered 2019-10-19: 10 meq via INTRAVENOUS
  Filled 2019-10-19: qty 100

## 2019-10-19 MED ORDER — SODIUM CHLORIDE 0.9% FLUSH
3.0000 mL | Freq: Two times a day (BID) | INTRAVENOUS | Status: DC
  Administered 2019-10-19 – 2019-10-20 (×2): 3 mL via INTRAVENOUS

## 2019-10-19 MED ORDER — SODIUM CHLORIDE 0.9 % IV SOLN
10.0000 mL/h | Freq: Once | INTRAVENOUS | Status: AC
  Administered 2019-10-19: 10 mL/h via INTRAVENOUS

## 2019-10-19 MED ORDER — ACETAMINOPHEN 325 MG PO TABS: 650.0000 mg | ORAL_TABLET | Freq: Four times a day (QID) | ORAL | Status: DC | PRN

## 2019-10-19 MED ORDER — ACETAMINOPHEN 650 MG RE SUPP: 650.0000 mg | Freq: Four times a day (QID) | RECTAL | Status: DC | PRN

## 2019-10-19 NOTE — ED Notes (Signed)
Blood bank called RN and reported that blood will have to come from [facility] in [place] due to her positive type and screen. Therefore it will be a delay in receiving the blood to transfuse. Phylliss Bob, [place] notified.

## 2019-10-19 NOTE — ED Triage Notes (Signed)
Pt brought in by [facility] from [facility]. Pt had bloodwork done and hemoglobin was shown to be 5.2. Pt denies bloody stoools, dark black stools, hematemesis. Pt is Covid positive and is currently on O2 since being diagnosed. Nursing facility reported to EMS that pt's O2 sats were low, but didn't give a specific reading. O2 sats 98% on 2L via [place] for EMS.

## 2019-10-19 NOTE — ED Notes (Signed)
Pt was tested at [facility] and tested COVID + on 10/09/19. Paper copy of result at bedside.

## 2019-10-19 NOTE — Progress Notes (Addendum)
Brief note regarding plan, with full H&P to follow:  69 year old female with history of CAD status post CABG, hypertension, multiple ischemic CVAs now chronically anticoagulated on Eliquis, chronic iron deficiency anemia requiring prior blood transfusions, who is admitted to [facility] with suspected acute on chronic iron deficiency anemia after presenting from [facility] to [facility] emergency department for evaluation of incidental finding of Hgb of 5.2 identified on outpatient labs.  Presenting hemoglobin per labs in the ED found to be 5.6, which is relative to most recent prior value of 9.7 in April 2020.  Patient denies any recent melena, hematochezia, or hematemesis, while Hemoccult stool found to be negative.  BUN not elevated. the patient is undergoing transfusion of 2 units PRBC, with repeat hemoglobin ordered to be checked following completion of second unit PRBC. NPO. No history of liver disease.   Of note, the patient was found to be Covid positive per outpatient PCR performed on 10/09/2019.  This test was reportedly prompted by the presence of generalized myalgias at that time, although the patient reports subsequent resolution thereof.  While the patient denies any baseline supplemental oxygen requirements, she reports that she was empirically started on 2 L nasal cannula at the time of her Covid diagnosis.  Presenting oxygen saturations noted to be in the high 90s on aforementioned 2 L [place].     Babs Bertin, DO Hospitalist

## 2019-10-19 NOTE — H&P (Signed)
History and Physical    PLEASE NOTE THAT DRAGON DICTATION SOFTWARE WAS USED IN THE CONSTRUCTION OF THIS NOTE.   Karen Mccann [id number] DOB: 07-11-1951 DOA: 10/19/2019  PCP: Caprice Renshaw, MD Patient coming from: [facility]  I have personally briefly reviewed patient's old medical records in [place]  Chief Complaint: Low hemoglobin on outpatient labs  HPI: Karen Mccann is a 69 y.o. female with medical history significant for CAD status post CABG, hypertension, multiple ischemic CVAs now chronically anticoagulated on Eliquis, chronic iron deficiency anemia on chronic oral iron supplementation with baseline hemoglobin of 10 requiring prior blood transfusions, who is admitted to [facility] on 10/19/2019 with suspected acute on chronic iron deficiency anemia after presenting from [facility] of incidental finding of Hgb of 5.2 identified on outpatient labs.   The patient was previously admitted to [facility] in late July 2019 with suspected acute on chronic iron deficiency anemia with presenting hemoglobin at that time noted to be 5.2.  On 05/14/2018 she underwent EGD/colonoscopy via Dr. Laural Golden with findings notable for large hiatal hernia associate with linear erosions but no evidence of associated active bleed or peptic ulcer disease.  Colonoscopy that time was notable for cecal polypectomy with ensuing benign pathology result.  This represents most recent prior endoscopic evaluation.  Earlier today, the patient underwent routine maintenance labs as an outpatient, with incidental finding of hemoglobin of 5.2 relative to most recent prior hemoglobin value of 9.7 on 02/09/2019.  The patient was subsequently brought to [facility] emergency department for further evaluation.  She denies any associated chest pain, shortness of breath, dizziness, presyncope, syncope, palpitations, or diaphoresis.  She also denies any recent  nausea, vomiting, hematemesis.  Denies any recent abdominal discomfort, diarrhea, melena, or hematochezia.  Review of most recent prior discharge summary reveals that the patient has on chronic Eliquis as well as full dose aspirin in the setting of multiple prior acute ischemic CVAs in addition to history of coronary artery disease.  The patient confirms that remains on both of these blood thinning agents.  Denies any use of NSAIDs or consumption of alcohol.  Denies any history of underlying liver disease.  No recent trauma.  Of note, the patient was found to be Covid positive per outpatient PCR performed on 10/09/2019.  This test was reportedly prompted by the presence of generalized myalgias at that time, although the patient reports subsequent resolution thereof.  While the patient denies any baseline supplemental oxygen requirements, she reports that she was empirically started on 2 L nasal cannula at the time of her Covid diagnosis.   She denies any recent neck stiffness, rhinitis, rhinorrhea, sore throat, shortness of breath, cough.  She also denies any recent subjective fever, chills, or rigors. the patient confirms that she is a former smoker, having completely quit smoking in 2004.  She denies any chronic underlying pulmonary conditions.     ED Course:  Vital signs in the ED were notable for the following: Temperature max 97.8; heart rate 74-80; blood pressure ranged from 97/60 1-1 16/54; respiratory rate 15-20, and oxygen saturation 99 to 100% on 2 L nasal cannula.   Labs were notable for the following: CMP notable for sodium 144, potassium 3.4, BUN 12, creatinine 0.78.  CBC notable for white blood cell count of 3000, hemoglobin 5.6 compared to most recent prior value of 9.7 when checked on 02/09/2019, platelets 220.  INR 1.1.  Fecal occult  blood found to be negative.  In the setting of recent Covid diagnosis, chest x-ray was ordered, with results currently pending.  While in the ED, the patient  was typed and screened for 2 units PRBC, with delay in initiation of transfusion of such prompted by presence of detected antibodies prompting need to obtain PRBCs from [facility].   Subsequently, the patient was admitted to [facility] for further evaluation and management of presenting acute on chronic iron deficiency anemia.     Review of Systems: As per HPI otherwise 10 point review of systems negative.   Past Medical History:  Diagnosis Date  . Anxiety   . Atherosclerotic heart disease of native coronary artery without angina pectoris   . Bipolar 1 disorder, depressed ([place])   . Chronic pain syndrome   . COPD (chronic obstructive pulmonary disease) ([place])   . COVID-19   . Depression   . Essential (primary) hypertension   . Gastroesophageal reflux   . GERD without esophagitis   . Heart failure ([place])   . Heart failure, unspecified ([place])   . Hemiparesis ([place])   . Hemiplegia ([place])   . Herpesviral infection, unspecified   . Hyperlipidemia   . Hypothyroidism   . Iron deficiency anemia   . Long term (current) use of anticoagulants   . Major depressive disorder   . Opioid dependence, uncomplicated ([place])   . Other disorders of plasma-protein metabolism, not elsewhere classified   . Paraplegia, unspecified ([place])   . Personal history of other diseases of the circulatory system   . Phonological disorder   . Presence of aortocoronary bypass graft   . Seizures ([place])   . Stroke ([place])   . Unspecified convulsions ([place])   . Vitamin D deficiency     Past Surgical History:  Procedure Laterality Date  . CARDIAC SURGERY    . COLONOSCOPY WITH PROPOFOL N/A 05/14/2018   Procedure: COLONOSCOPY WITH PROPOFOL;  Surgeon: Rogene Houston, MD;  Location: [facility] ENDO SUITE;  Service: Endoscopy;  Laterality: N/A;  . ESOPHAGOGASTRODUODENOSCOPY (EGD) WITH PROPOFOL N/A 05/14/2018   Procedure: ESOPHAGOGASTRODUODENOSCOPY (EGD) WITH PROPOFOL;  Surgeon: Rogene Houston, MD;  Location: [facility] ENDO  SUITE;  Service: Endoscopy;  Laterality: N/A;  . POLYPECTOMY  05/14/2018   Procedure: POLYPECTOMY;  Surgeon: Rogene Houston, MD;  Location: [facility] ENDO SUITE;  Service: Endoscopy;;  cecal polyp  . THYROID SURGERY     69 yrs old    Social History:  reports that she quit smoking about 16 years ago. Her smoking use included cigarettes. She smoked 0.50 packs per day. She has never used smokeless tobacco. She reports that she does not drink alcohol or use drugs.   Allergies  Allergen Reactions  . Formaldehyde     Unknown  . Latex Other (See Comments)    unknown    Family History  Problem Relation Age of Onset  . Hypertension Other      Prior to Admission medications   Medication Sig Start Date End Date Taking? Authorizing Provider  acetaminophen (TYLENOL) 325 MG tablet Take 650 mg by mouth every 8 (eight) hours as needed.    [provider]  apixaban (ELIQUIS) 2.5 MG TABS tablet Take 1 tablet (2.5 mg total) by mouth 2 (two) times daily for 30 days. 02/09/19 03/11/19  Manuella Ghazi, Pratik D, DO  ascorbic acid (VITAMIN C) 500 MG tablet Take 500 mg by mouth 2 (two) times daily.    [provider]  aspirin EC 325  MG EC tablet Take 1 tablet (325 mg total) by mouth daily. 02/09/19   Manuella Ghazi, Pratik D, DO  DULoxetine (CYMBALTA) 30 MG capsule Take 30 mg by mouth daily.     [provider]  escitalopram (LEXAPRO) 20 MG tablet Take 20 mg by mouth daily.    [provider]  ferrous sulfate 325 (65 FE) MG tablet Take 1 tablet (325 mg total) by mouth daily with breakfast. Patient taking differently: Take 325 mg by mouth 2 (two) times daily with a meal.  05/16/18   Manuella Ghazi, Pratik D, DO  fluticasone (FLONASE) 50 MCG/ACT nasal spray Place 1 spray into both nostrils at bedtime.    [provider]  furosemide (LASIX) 20 MG tablet Take 20 mg by mouth daily.     [provider]  hydrOXYzine (ATARAX/VISTARIL) 25 MG tablet Take 25 mg by mouth every 6 (six) hours as needed  for itching.     [provider]  ipratropium (ATROVENT) 0.03 % nasal spray Place 2 sprays into both nostrils every 6 (six) hours as needed for rhinitis.    [provider]  isosorbide dinitrate (ISORDIL) 30 MG tablet Take 30 mg by mouth daily.    [provider]  levETIRAcetam (KEPPRA) 500 MG tablet Take 1 tablet (500 mg total) by mouth 2 (two) times daily for 30 days. 02/09/19 03/11/19  Manuella Ghazi, Pratik D, DO  levothyroxine (SYNTHROID, LEVOTHROID) 200 MCG tablet Take 200 mcg by mouth daily before breakfast.    [provider]  LORazepam (ATIVAN) 0.5 MG tablet Take 0.5 mg by mouth at bedtime.    [provider]  Melatonin 3 MG TABS Take 3 mg by mouth at bedtime.     [provider]  metoprolol tartrate (LOPRESSOR) 25 MG tablet Take 1 tablet (25 mg total) by mouth 2 (two) times daily. Patient taking differently: Take 25 mg by mouth daily.  05/16/15   Arnoldo Lenis, MD  nitroGLYCERIN (NITROSTAT) 0.4 MG SL tablet Place 0.4 mg under the tongue every 5 (five) minutes as needed for chest pain.    [provider]  ondansetron (ZOFRAN) 4 MG tablet Take 4 mg by mouth every 8 (eight) hours as needed for nausea or vomiting.     [provider]  oxyCODONE-acetaminophen (PERCOCET/ROXICET) 5-325 MG tablet Take 1 tablet by mouth every 12 (twelve) hours as needed for moderate pain or severe pain.     [provider]  polyethylene glycol powder (GLYCOLAX/MIRALAX) powder Take 17 g by mouth daily.    [provider]  pravastatin (PRAVACHOL) 80 MG tablet Take 80 mg by mouth daily.    [provider]  pregabalin (LYRICA) 75 MG capsule Take 75 mg by mouth 2 (two) times daily.     [provider]  senna (SENOKOT) 8.6 MG TABS tablet Take 1 tablet by mouth 2 (two) times daily.    [provider]  vitamin B-12 (CYANOCOBALAMIN) 1000 MCG tablet Take 1,000 mcg by mouth daily.    [provider]      Objective    Physical Exam: Vitals:   10/19/19 1453 10/19/19 1530 10/19/19 1600 10/19/19 1630  BP: (!) 98/55 (!) 114/56 (!) 116/54 97/61  Pulse: 80 79 77 74  Resp: 17 18 20 15   Temp: 97.8 F (36.6 C)     TempSrc: Oral     SpO2: 100% 100% 100% 99%  Weight:      Height:        General: appears  to be stated age; alert, oriented; appears pale. Skin: warm, dry, no rash Head:  AT/[place] Eyes:  PEARL b/l, EOMI Mouth:  Oral mucosa membranes appear dry, normal dentition Neck: supple; trachea midline Heart:  RRR; did not appreciate any M/R/G Lungs: CTAB, did not appreciate any wheezes, rales, or rhonchi Abdomen: + BS; soft, ND, NT Vascular: 2+ pedal pulses b/l; 2+ radial pulses b/l Extremities: no peripheral edema, no muscle wasting  Labs on Admission: I have personally reviewed following labs and imaging studies  CBC: Recent Labs  Lab 10/19/19 1512  WBC 3.0*  NEUTROABS 1.6*  HGB 5.6*  HCT 23.0*  MCV 77.4*  PLT [id number]   Basic Metabolic Panel: Recent Labs  Lab 10/19/19 1512  NA 144  K 3.4*  CL 108  CO2 30  GLUCOSE 130*  BUN 12  CREATININE 0.78  CALCIUM 7.7*   GFR: Estimated Creatinine Clearance: 89.2 mL/min (by C-G formula based on SCr of 0.78 mg/dL). Liver Function Tests: Recent Labs  Lab 10/19/19 1512  AST 15  ALT 11  ALKPHOS 56  BILITOT 0.5  PROT 6.1*  ALBUMIN 2.7*   No results for input(s): LIPASE, AMYLASE in the last 168 hours. No results for input(s): AMMONIA in the last 168 hours. Coagulation Profile: Recent Labs  Lab 10/19/19 1512  INR 1.1   Cardiac Enzymes: No results for input(s): CKTOTAL, CKMB, CKMBINDEX, TROPONINI in the last 168 hours. BNP (last 3 results) No results for input(s): PROBNP in the last 8760 hours. HbA1C: No results for input(s): HGBA1C in the last 72 hours. CBG: No results for input(s): GLUCAP in the last 168 hours. Lipid Profile: No results for input(s): CHOL, HDL, LDLCALC, TRIG, CHOLHDL, LDLDIRECT in the last 72  hours. Thyroid Function Tests: No results for input(s): TSH, T4TOTAL, FREET4, T3FREE, THYROIDAB in the last 72 hours. Anemia Panel: No results for input(s): VITAMINB12, FOLATE, FERRITIN, TIBC, IRON, RETICCTPCT in the last 72 hours. Urine analysis:    Component Value Date/Time   COLORURINE YELLOW 02/08/2019 0910   APPEARANCEUR CLEAR 02/08/2019 0910   LABSPEC 1.020 02/08/2019 0910   PHURINE 5.5 02/08/2019 0910   GLUCOSEU NEGATIVE 02/08/2019 0910   HGBUR LARGE (A) 02/08/2019 0910   BILIRUBINUR NEGATIVE 02/08/2019 0910   KETONESUR NEGATIVE 02/08/2019 0910   PROTEINUR TRACE (A) 02/08/2019 0910   NITRITE POSITIVE (A) 02/08/2019 0910   LEUKOCYTESUR MODERATE (A) 02/08/2019 0910     Assessment/Plan   Karen Mccann is a 69 y.o. female with medical history significant for CAD status post CABG, hypertension, multiple ischemic CVAs now chronically anticoagulated on Eliquis, chronic iron deficiency anemia on chronic oral iron supplementation with baseline hemoglobin of 10 requiring prior blood transfusions, who is [facility] on 10/19/2019 with suspected acute on chronic iron deficiency anemia after [facility] finding of Hgb of 5.2 identified on outpatient labs.    Principal Problem:   Acute on chronic anemia Active Problems:   Hypothyroid   HTN (hypertension)   COVID-19 virus infection   Hypokalemia   #) Acute on chronic iron deficiency anemia: In the setting of a documented history of chronic iron deficiency anemia on chronic oral iron supplementation with baseline hemoglobin of approximately 10, routine outpatient labs incidentally noted to demonstrate hemoglobin of 5.2, which represents interval decline from most recent prior hemoglobin data point of 9.7 on 02/09/2019.  The patient is currently asymptomatic, and appears hemodynamically stable, suggestive of a subacute timeframe of  patient's  worsening iron deficiency anemia. Per review of prior gastroenterology notes, patient's chronic iron deficiency anemia is believed to be multifactorial in source, with suspected intermittent slow upper gastrointestinal bleed given results of most recent prior EGD/colonoscopy in August 2019, as described above, as well as contribution from suspected diminished iron absorption.  No history of alcohol abuse, liver disease, or NSAID use.  However, the patient is noted to be chronically anticoagulated on Eliquis in the setting of multiple prior acute ischemic CVAs in addition to taking a daily full dose aspirin in the setting of coronary artery disease, as above.  Presenting INR found to be nonelevated at 1.1.  The patient denies any recent hematemesis, melena, or hematochezia, and fecal occult blood performed in the ED this evening was found to be negative.  Will keep patient n.p.o. overnight, with possibility discussing case with gastroenterology in the morning. In the ED the patient was typed and screened for 2 units PRBC, with delay in initiation of transfusion prompted by presence of detected antibodies prompting need to obtain PRBCs from [facility].  Of note, while past medical history list includes chronic heart failure, and I encountered results of a prior echocardiogram.   Plan: Transfuse 2 units PRBC over 2 hours/unit, with repeat hemoglobin to be checked 30 minutes following completion of transfusion of second unit.  Blood bank asked to stay 2 units ahead.  Monitor on telemetry.  While there is no overt evidence of an acute upper gastrointestinal bleed at this time, will start Protonix 40 mg IV twice daily given history of large hiatal hernia with linear erosions observed on most recent prior EGD.  Repeat CBC in the morning.  Hold home Eliquis and aspirin for now.  NPO.  SCDs.  Consider discussing patient's case with gastroenterology in the morning.  Add on iron studies to pretransfusion labs.   Repeat INR in the morning.      #) COVID-19 infection: Diagnosed with COVID-19 per positive PCR performed as an outpatient on 10/09/2019, prompted by generalized myalgias experienced at the time.  The patient reports interval resolution of her generalized myalgias, and is overall asymptomatic at this time.  She reports empiric initiation of 2 L supplemental oxygen at the time of her Covid diagnosis in the absence of any precipitating acute hypoxic respiratory distress.  Denies any chronic underlying pulmonary conditions or baseline supplemental oxygen requirements.  Given presenting oxygen saturations in the range of 99 to 100% on aforementioned nasal cannula, I am not completely convinced that the patient has acute hypoxic respiratory distress.  Will attempt to wean supplemental oxygen in order to maintain oxygen saturations greater than or equal to 92% in order to determine if acute supplemental oxygen requirement exists.  In the meantime, will refrain from initiation of Decadron and remdesivir.  Chest x-ray ordered in the ED, with result currently pending. Will also check general inflammatory markers at this time.   Plan: airborne and contact precautions with eye protection. Monitor continuous pulse oximetry. prn supplemental O2 to maintain O2 sats greater than or equal to 92%. monitor on telemetry.  Check ABG for purpose of evaluating PaO2 to FiO2 ratio.  Follow for result of chest x-ray, as above.  Check general inflammatory markers.  Check serum phosphorus and procalcitonin levels.  Repeat CBC in the morning.  As needed acetaminophen for fever and as needed albuterol inhaler for shortness of breath.     #) Hypokalemia: Presenting serum potassium level mildly low at 3.4.  Plan: Potassium  chloride 20 mEq IV over 2 hours x 1 now.  Add on serum magnesium level.  Monitor on telemetry.  Repeat BMP in the morning.      #) History of seizures: The patient reports that she no longer takes Keppra,  and this appears to be substantiated by outpatient medication reconciliation performed by inpatient pharmacy at the time of this evening's admission.  Currently, she is on no antiepileptics at home.  No gross evidence of acute seizures.  Plan: will monitor for evidence of seizure-like activity.     #) Chronic pain syndrome: Outpatient analgesic regimen appears to consist of as needed Percocet.  The patient denies any NSAID use as an outpatient.  She currently reports adequate pain control.  In the setting of intravascular depletion, opioid use can result in exacerbation of hypotension.  As result, will attempt to refrain from opioids at this time.   Plan: will hold home as needed Percocet for now, as described above.      #) History of hypertension: Outpatient antihypertensive regimen includes Isordil, Lasix, and Lopressor.  Presenting blood pressures have been found to be low normal since presenting to the ED.  In the context of presenting suspected acute on chronic iron deficiency anemia, will hold home antihypertensives for now.   Plan: Hold home antihypertensive medications for now, as above.     DVT prophylaxis: SCDs Code Status: Per my discussions with the patient this evening, she wishes to be full code Family Communication: None Disposition Plan:  Per Rounding Team Consults called: None Admission status: Observation; med telemetry   PLEASE NOTE THAT DRAGON DICTATION SOFTWARE WAS USED IN THE CONSTRUCTION OF THIS NOTE.   [facility] Pager [phone] From 3PM- 11PM.   Otherwise, please contact night-coverage  [email or web address] Password [facility]  10/19/2019, 4:48 PM

## 2019-10-19 NOTE — ED Provider Notes (Signed)
[facility] Provider Note   CSN: [id number] Arrival date & time: 10/19/19  1424     History Chief Complaint  Patient presents with  . Abnormal Lab    Karen Mccann is a 69 y.o. female with a past medical history of COVID-19, COPD, hypertension, heart failure, hemiplegia, seizures, stroke, anemia who presents today for evaluation of anemia.  She arrives from [place] home with a reported hemoglobin of 5.2.  She denies any dark tarry or bloody stools or hematemesis.  She denies any bleeding wounds or hematuria.  She was diagnosed with Covid on 10/09/2019 and has reportedly been on oxygen since then.    She reports that she has previously required blood transfusions multiple times.  She reports she has body wide aches and felt slightly lightheaded the day she got diagnosed with Covid however otherwise no significant symptoms that are new.  HPI     Past Medical History:  Diagnosis Date  . Anxiety   . Atherosclerotic heart disease of native coronary artery without angina pectoris   . Bipolar 1 disorder, depressed ([place])   . Chronic pain syndrome   . COPD (chronic obstructive pulmonary disease) ([place])   . COVID-19   . Depression   . Essential (primary) hypertension   . Gastroesophageal reflux   . GERD without esophagitis   . Heart failure ([place])   . Heart failure, unspecified ([place])   . Hemiparesis ([place])   . Hemiplegia ([place])   . Herpesviral infection, unspecified   . Hyperlipidemia   . Hypothyroidism   . Iron deficiency anemia   . Long term (current) use of anticoagulants   . Major depressive disorder   . Opioid dependence, uncomplicated ([place])   . Other disorders of plasma-protein metabolism, not elsewhere classified   . Paraplegia, unspecified ([place])   . Personal history of other diseases of the circulatory system   . Phonological disorder   . Presence of aortocoronary bypass graft   . Seizures ([place])   . Stroke ([place])   . Unspecified convulsions  ([place])   . Vitamin D deficiency     Patient Active Problem List   Diagnosis Date Noted  . Status epilepticus ([place]) 02/08/2019  . Anemia 05/12/2018  . Sepsis ([place]) 03/23/2016  . 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 Lower urinary tract infectious disease 09/17/2015  . Stroke ([place]) 05/09/2015  . Hypothyroid 05/09/2015  . Bipolar 1 disorder ([employer]) 05/09/2015  . Seizures ([place]) 05/09/2015  . Hyperlipidemia 05/09/2015  . COPD (chronic obstructive pulmonary disease) ([place]) 05/09/2015  . Depression 05/09/2015  . Anxiety 05/09/2015  . General weakness 05/09/2015  . CAD (coronary artery disease) 05/09/2015  . 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 HTN (hypertension) 05/09/2015  . Chronic pain 05/09/2015  . Esophageal reflux disease 05/09/2015  . Other heart disorders in diseases classified elsewhere 05/09/2015  . Paraplegia ([place]) 05/09/2015    Past Surgical History:  Procedure Laterality Date  . CARDIAC SURGERY    . COLONOSCOPY WITH PROPOFOL N/A 05/14/2018   Procedure: COLONOSCOPY WITH PROPOFOL;  Surgeon: Rogene Houston, MD;  Location: [facility];  Service: Endoscopy;  Laterality: N/A;  . ESOPHAGOGASTRODUODENOSCOPY (EGD) WITH PROPOFOL N/A 05/14/2018   Procedure: ESOPHAGOGASTRODUODENOSCOPY (EGD) WITH PROPOFOL;  Surgeon: Rogene Houston, MD;  Location: [facility];  Service: Endoscopy;  Laterality: N/A;  . POLYPECTOMY  05/14/2018   Procedure: POLYPECTOMY;  Surgeon: Rogene Houston, MD;  Location: [facility];  Service: Endoscopy;;  cecal polyp  . THYROID  SURGERY     69 yrs old     OB History   No obstetric history on file.     Family History  Problem Relation Age of Onset  . Hypertension Other     Social History   Tobacco Use  . Smoking status: Former Smoker    Packs/day: 0.50    Types: Cigarettes    Quit date: 05/16/2003    Years since quitting: 16.4  . Smokeless tobacco: Never Used  Substance Use Topics  . Alcohol use: No    Alcohol/week: 0.0 standard drinks  . Drug use:  Never    Home Medications Prior to Admission medications   Medication Sig Start Date End Date Taking? Authorizing Provider  acetaminophen (TYLENOL) 325 MG tablet Take 650 mg by mouth every 8 (eight) hours as needed.    [provider]  apixaban (ELIQUIS) 2.5 MG TABS tablet Take 1 tablet (2.5 mg total) by mouth 2 (two) times daily for 30 days. 02/09/19 03/11/19  Manuella Ghazi, Pratik D, DO  ascorbic acid (VITAMIN C) 500 MG tablet Take 500 mg by mouth 2 (two) times daily.    [provider]  aspirin EC 325 MG EC tablet Take 1 tablet (325 mg total) by mouth daily. 02/09/19   Manuella Ghazi, Pratik D, DO  DULoxetine (CYMBALTA) 30 MG capsule Take 30 mg by mouth daily.     [provider]  escitalopram (LEXAPRO) 20 MG tablet Take 20 mg by mouth daily.    [provider]  ferrous sulfate 325 (65 FE) MG tablet Take 1 tablet (325 mg total) by mouth daily with breakfast. Patient taking differently: Take 325 mg by mouth 2 (two) times daily with a meal.  05/16/18   Manuella Ghazi, Pratik D, DO  fluticasone (FLONASE) 50 MCG/ACT nasal spray Place 1 spray into both nostrils at bedtime.    [provider]  furosemide (LASIX) 20 MG tablet Take 20 mg by mouth daily.     [provider]  hydrOXYzine (ATARAX/VISTARIL) 25 MG tablet Take 25 mg by mouth every 6 (six) hours as needed for itching.     [provider]  ipratropium (ATROVENT) 0.03 % nasal spray Place 2 sprays into both nostrils every 6 (six) hours as needed for rhinitis.    [provider]  isosorbide dinitrate (ISORDIL) 30 MG tablet Take 30 mg by mouth daily.    [provider]  levETIRAcetam (KEPPRA) 500 MG tablet Take 1 tablet (500 mg total) by mouth 2 (two) times daily for 30 days. 02/09/19 03/11/19  Manuella Ghazi, Pratik D, DO  levothyroxine (SYNTHROID, LEVOTHROID) 200 MCG tablet Take 200 mcg by mouth daily before breakfast.    [provider]  LORazepam (ATIVAN) 0.5 MG tablet Take 0.5 mg by mouth at  bedtime.    [provider]  Melatonin 3 MG TABS Take 3 mg by mouth at bedtime.     [provider]  metoprolol tartrate (LOPRESSOR) 25 MG tablet Take 1 tablet (25 mg total) by mouth 2 (two) times daily. Patient taking differently: Take 25 mg by mouth daily.  05/16/15   Arnoldo Lenis, MD  nitroGLYCERIN (NITROSTAT) 0.4 MG SL tablet Place 0.4 mg under the tongue every 5 (five) minutes as needed for chest pain.    [provider]  ondansetron (ZOFRAN) 4 MG tablet Take 4 mg by mouth every 8 (eight) hours as needed for nausea or vomiting.     [provider]  oxyCODONE-acetaminophen (PERCOCET/ROXICET) 5-325 MG tablet Take  1 tablet by mouth every 12 (twelve) hours as needed for moderate pain or severe pain.     [provider]  polyethylene glycol powder (GLYCOLAX/MIRALAX) powder Take 17 g by mouth daily.    [provider]  pravastatin (PRAVACHOL) 80 MG tablet Take 80 mg by mouth daily.    [provider]  pregabalin (LYRICA) 75 MG capsule Take 75 mg by mouth 2 (two) times daily.     [provider]  senna (SENOKOT) 8.6 MG TABS tablet Take 1 tablet by mouth 2 (two) times daily.    [provider]  vitamin B-12 (CYANOCOBALAMIN) 1000 MCG tablet Take 1,000 mcg by mouth daily.    [provider]    Allergies    Formaldehyde and Latex  Review of Systems   Review of Systems  Constitutional: Positive for fatigue and fever.  Respiratory: Positive for cough.   Cardiovascular: Negative for chest pain, palpitations and leg swelling.  Gastrointestinal: Negative for abdominal pain, diarrhea, nausea and vomiting.  Genitourinary: Negative for dysuria.  Musculoskeletal: Positive for arthralgias and myalgias.  Neurological: Positive for light-headedness. Negative for headaches.  All other systems reviewed and are negative.   Physical Exam Updated Vital Signs BP (!) 116/54   Pulse 77   Temp 97.8 F (36.6 C) (Oral)    Resp 20   Ht 5\' 6"  (1.676 m)   Wt 115.2 kg   SpO2 100%   BMI 41.00 kg/m   Physical Exam Vitals and nursing note reviewed. Exam conducted with a chaperone present Medical illustrator, tech).  Constitutional:      Appearance: She is well-developed. She is obese. She is not diaphoretic.     Comments: [place] oxygen on  HENT:     Head: Normocephalic and atraumatic.  Eyes:     General: No scleral icterus.       Right eye: No discharge.        Left eye: No discharge.     Conjunctiva/sclera: Conjunctivae normal.  Cardiovascular:     Rate and Rhythm: Normal rate and regular rhythm.     Pulses: Normal pulses.  Pulmonary:     Effort: Pulmonary effort is normal. No respiratory distress.     Breath sounds: No stridor.  Abdominal:     General: There is no distension.     Palpations: Abdomen is soft.     Tenderness: There is no abdominal tenderness.  Genitourinary:    Rectum: Guaiac result negative.  Musculoskeletal:        General: No deformity.     Cervical back: Normal range of motion and neck supple.     Right lower leg: Edema present.     Left lower leg: Edema present.  Skin:    General: Skin is warm and dry.  Neurological:     Mental Status: She is alert. Mental status is at baseline.     Motor: No abnormal muscle tone.     Comments: Awake and alert.  Responds appropriately to commands and questions.   Psychiatric:        Behavior: Behavior normal.     ED Results / Procedures / Treatments   Labs (all labs ordered are listed, but only abnormal results are displayed) Labs Reviewed  BASIC METABOLIC PANEL - Abnormal; Notable for the following components:      Result Value   Potassium 3.4 (*)    Glucose, Bld 130 (*)    Calcium 7.7 (*)    All other components within normal  limits  CBC WITH DIFFERENTIAL/PLATELET - Abnormal; Notable for the following components:   WBC 3.0 (*)    RBC 2.97 (*)    Hemoglobin 5.6 (*)    HCT 23.0 (*)    MCV 77.4 (*)    MCH 18.9 (*)    MCHC 24.3  (*)    RDW 19.8 (*)    nRBC 1.7 (*)    Neutro Abs 1.6 (*)    All other components within normal limits  HEPATIC FUNCTION PANEL - Abnormal; Notable for the following components:   Total Protein 6.1 (*)    Albumin 2.7 (*)    All other components within normal limits  PROTIME-INR  POC OCCULT BLOOD, ED  TYPE AND SCREEN  PREPARE RBC (CROSSMATCH)    EKG None  Radiology No results found.  Procedures .Critical Care Performed by: Lorin Glass, PA-C Authorized by: Lorin Glass, PA-C   Critical care provider statement:    Critical care time (minutes):  45   Critical care was necessary to treat or prevent imminent or life-threatening deterioration of the following conditions:  Shock   Critical care was time spent personally by me on the following activities:  Discussions with consultants, evaluation of patient's response to treatment, examination of patient, ordering and performing treatments and interventions, ordering and review of laboratory studies, ordering and review of radiographic studies, pulse oximetry, re-evaluation of patient's condition, obtaining history from patient or surrogate and review of old charts Comments:     Anemia requiring transfusion.    (including critical care time)  Medications Ordered in ED Medications  0.9 %  sodium chloride infusion (has no administration in time range)    ED Course  I have reviewed the triage vital signs and the nursing notes.  Pertinent labs & imaging results that were available during my care of the patient were reviewed by me and considered in my medical decision making (see chart for details).  Clinical Course as of Oct 18 2124  Tue Oct 19, 2019  1603 Documentation in room of positive covid test on 10/09/2019, RT-PCR test   [EH]  1614 Spoke with Hospitalist who will see patient for admission.    [EH]    Clinical Course User Index [EH] Ollen Gross   MDM Rules/Calculators/A&P                      Patient is a 69 year old woman who presents today for evaluation of anemia.  She resides at [facility] where she was found to be Covid positive on 10/09/2019.  She had blood work drawn showing that she was anemic hemoglobin of 5.2.  Here her hemoglobin is 5.6.  Her blood pressures were soft, in the 0000000 systolic.  Hemoccult blood was negative.  Blood was ordered, will have to be [facility] to [facility] due to antibodies present in patient's type and screen.  Patient has leukopenia at 3.0 consistent with Covid.  Chest x-ray shows no evidence of acute process in the chest.  This patient was seen as a shared visit with Dr. Roderic Palau.  I spoke with hospitalist who will admit patient for transfusion.  Note: Portions of this report may have been transcribed using voice recognition software. Every effort was made to ensure accuracy; however, inadvertent computerized transcription errors may be present   Final Clinical Impression(s) / ED Diagnoses Final diagnoses:  Anemia, unspecified type  COVID-19  Encounter for blood transfusion    Rx / DC Orders  ED Discharge Orders    None       Ollen Gross 10/19/19 2130    Milton Ferguson, MD 10/20/19 1029

## 2019-10-20 DIAGNOSIS — D509 Iron deficiency anemia, unspecified: Secondary | ICD-10-CM | POA: Diagnosis not present

## 2019-10-20 DIAGNOSIS — E039 Hypothyroidism, unspecified: Secondary | ICD-10-CM | POA: Diagnosis not present

## 2019-10-20 DIAGNOSIS — U071 COVID-19: Secondary | ICD-10-CM | POA: Diagnosis not present

## 2019-10-20 DIAGNOSIS — I10 Essential (primary) hypertension: Secondary | ICD-10-CM | POA: Diagnosis not present

## 2019-10-20 DIAGNOSIS — D649 Anemia, unspecified: Secondary | ICD-10-CM | POA: Diagnosis not present

## 2019-10-20 LAB — CBC WITH DIFFERENTIAL/PLATELET
Abs Immature Granulocytes: 0.03 10*3/uL (ref 0.00–0.07)
Basophils Absolute: 0 10*3/uL (ref 0.0–0.1)
Basophils Relative: 0 %
Eosinophils Absolute: 0.1 10*3/uL (ref 0.0–0.5)
Eosinophils Relative: 2 %
HCT: 29.2 % — ABNORMAL LOW (ref 36.0–46.0)
Hemoglobin: 7.9 g/dL — ABNORMAL LOW (ref 12.0–15.0)
Immature Granulocytes: 1 %
Lymphocytes Relative: 37 %
Lymphs Abs: 1.2 10*3/uL (ref 0.7–4.0)
MCH: 21.7 pg — ABNORMAL LOW (ref 26.0–34.0)
MCHC: 27.1 g/dL — ABNORMAL LOW (ref 30.0–36.0)
MCV: 80.2 fL (ref 80.0–100.0)
Monocytes Absolute: 0.3 10*3/uL (ref 0.1–1.0)
Monocytes Relative: 10 %
Neutro Abs: 1.7 10*3/uL (ref 1.7–7.7)
Neutrophils Relative %: 50 %
Platelets: 184 10*3/uL (ref 150–400)
RBC: 3.64 MIL/uL — ABNORMAL LOW (ref 3.87–5.11)
RDW: 19.8 % — ABNORMAL HIGH (ref 11.5–15.5)
WBC: 3.3 10*3/uL — ABNORMAL LOW (ref 4.0–10.5)
nRBC: 2.5 % — ABNORMAL HIGH (ref 0.0–0.2)

## 2019-10-20 LAB — COMPREHENSIVE METABOLIC PANEL
ALT: 10 U/L (ref 0–44)
AST: 15 U/L (ref 15–41)
Albumin: 2.8 g/dL — ABNORMAL LOW (ref 3.5–5.0)
Alkaline Phosphatase: 62 U/L (ref 38–126)
Anion gap: 5 (ref 5–15)
BUN: 11 mg/dL (ref 8–23)
CO2: 28 mmol/L (ref 22–32)
Calcium: 7.6 mg/dL — ABNORMAL LOW (ref 8.9–10.3)
Chloride: 108 mmol/L (ref 98–111)
Creatinine, Ser: 0.65 mg/dL (ref 0.44–1.00)
GFR calc Af Amer: >60 mL/min (ref >60)
GFR calc non Af Amer: >60 mL/min (ref >60)
Glucose, Bld: 98 mg/dL (ref 70–99)
Potassium: 3.5 mmol/L (ref 3.5–5.1)
Sodium: 141 mmol/L (ref 135–145)
Total Bilirubin: 0.9 mg/dL (ref 0.3–1.2)
Total Protein: 6 g/dL — ABNORMAL LOW (ref 6.5–8.1)

## 2019-10-20 LAB — BLOOD GAS, ARTERIAL
Acid-Base Excess: 6.8 mmol/L — ABNORMAL HIGH (ref 0.0–2.0)
Bicarbonate: 30.4 mmol/L — ABNORMAL HIGH (ref 20.0–28.0)
FIO2: 28
O2 Saturation: 96.4 %
Patient temperature: 37.4
pCO2 arterial: 53.6 mmHg — ABNORMAL HIGH (ref 32.0–48.0)
pH, Arterial: 7.39 (ref 7.350–7.450)
pO2, Arterial: 83.5 mmHg (ref 83.0–108.0)

## 2019-10-20 LAB — PROTIME-INR
INR: 1.1 (ref 0.8–1.2)
Prothrombin Time: 14 seconds (ref 11.4–15.2)

## 2019-10-20 LAB — D-DIMER, QUANTITATIVE: D-Dimer, Quant: 1.26 ug/mL-FEU — ABNORMAL HIGH (ref 0.00–0.50)

## 2019-10-20 LAB — FIBRINOGEN: Fibrinogen: 398 mg/dL (ref 210–475)

## 2019-10-20 LAB — APTT: aPTT: 29 seconds (ref 24–36)

## 2019-10-20 LAB — HIV ANTIBODY (ROUTINE TESTING W REFLEX): HIV Screen 4th Generation wRfx: NONREACTIVE

## 2019-10-20 LAB — C-REACTIVE PROTEIN: CRP: 0.5 mg/dL (ref <1.0)

## 2019-10-20 LAB — MAGNESIUM: Magnesium: 2.2 mg/dL (ref 1.7–2.4)

## 2019-10-20 MED ORDER — POLYSACCHARIDE IRON COMPLEX 150 MG PO CAPS: 150.0000 mg | ORAL_CAPSULE | Freq: Two times a day (BID) | ORAL | 1 refills | Status: AC

## 2019-10-20 MED ORDER — SODIUM CHLORIDE 0.9 % IV SOLN
510.0000 mg | Freq: Once | INTRAVENOUS | Status: AC
  Administered 2019-10-20: 11:00:00 510 mg via INTRAVENOUS
  Filled 2019-10-20: qty 510

## 2019-10-20 MED ORDER — POLYSACCHARIDE IRON COMPLEX 150 MG PO CAPS: 150.0000 mg | ORAL_CAPSULE | Freq: Every day | ORAL | Status: DC

## 2019-10-20 NOTE — Clinical Social Work Note (Addendum)
Facility notified of discharge. D/C summary sent. EMS arranged. No fl2 as patient was in observation less than 24 hours.  Dtr, [facility], notified of dicharge.   Star Resler, Clydene Pugh, LCSW

## 2019-10-20 NOTE — Discharge Summary (Signed)
Physician Discharge Summary  Karen Mccann [id number] DOB: 06-26-1951 DOA: 10/19/2019  PCP: Karen Renshaw, MD  Admit date: 10/19/2019 Discharge date: 10/20/2019  Time spent: 35 minutes  Recommendations for Outpatient Follow-up:  1. Repeat CBC to follow hemoglobin trend 2. Outpatient follow-up with gastroenterology service 3. Follow-up final results from celiac panel. 4. Repeat basic metabolic lab to follow electrolytes trend.   Discharge Diagnoses:  Principal Problem:   Acute on chronic anemia Active Problems:   Hypothyroid   HTN (hypertension)   COVID-19 virus infection   Hypokalemia   Discharge Condition: Stable.  Patient asymptomatic and is stable to return to skilled nursing facility for further care.  Diet recommendation: Heart healthy/low calorie diet.  Filed Weights   10/19/19 1438 10/19/19 2000 10/20/19 0500  Weight: 115.2 kg 113.3 kg 113.1 kg    History of present illness:  As per H&P written by Dr. Velia Mccann on 10/18/2018 69 y.o. 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  The patient was previously admitted to [facility] in late July 2019 with suspected acute on chronic iron deficiency anemia with presenting hemoglobin at that time noted to be 5.2.  On 05/14/2018 she underwent EGD/colonoscopy via Dr. Laural Golden with findings notable for large hiatal hernia associate with linear erosions but no evidence of associated active bleed or peptic ulcer disease.  Colonoscopy that time was  notable for cecal polypectomy with ensuing benign pathology result.  This represents most recent prior endoscopic evaluation.  Earlier today, the patient underwent routine maintenance labs as an outpatient, with incidental finding of hemoglobin of 5.2 relative to most recent prior hemoglobin value of 9.7 on 02/09/2019.  The patient was subsequently brought to [facility] emergency department for further evaluation.  She denies any associated chest pain, shortness of breath, dizziness, presyncope, syncope, palpitations, or diaphoresis.  She also denies any recent nausea, vomiting, hematemesis.  Denies any recent abdominal discomfort, diarrhea, melena, or hematochezia.  Review of most recent prior discharge summary reveals that the patient has on chronic Eliquis as well as full dose aspirin in the setting of multiple prior acute ischemic CVAs in addition to history of coronary artery disease.  The patient confirms that remains on both of these blood thinning agents.  Denies any use of NSAIDs or consumption of alcohol.  Denies any history of underlying liver disease.  No recent trauma.  Of note, the patient was found to be Covid positive per outpatient PCR performed on 10/09/2019.This test was reportedly prompted by the presence of generalized myalgias at that time, although the patient reports subsequent resolution thereof.While the patient denies any baseline supplemental oxygen requirements, she reports that she was empirically started on 2 L nasal cannula at the time of her Covid diagnosis.  She denies any recent neck stiffness, rhinitis, rhinorrhea, sore throat, shortness of breath, cough.  She also denies any recent subjective fever, chills, or rigors. the patient confirms that she is a former smoker, having completely quit smoking in 2004.  She denies any chronic underlying pulmonary conditions.   ED Course:  Vital signs in the ED were notable for the following:  Temperature max 97.8; heart rate  74-80; blood pressure ranged from 97/60 1-1 16/54; respiratory rate 15-20, and oxygen saturation 99 to 100% on 2 L nasal cannula.   Hospital Course:  1-acute on chronic iron deficiency anemia -Iron panel demonstrating iron deficiency -No overt bleeding and negative fecal occult blood test. -2 units of PRBCs given with hemoglobin of 7.9 at discharge -Case discussed with gastroenterology services who recommended stopping aspirin, check for celiac panel and will have outpatient close follow-up for capsule endoscopy evaluation. -Patient is stable to return back to skilled nursing facility for further care. -Repeat CBC to follow hemoglobin trend.  2-COVID-19 infection -Patient asymptomatic and per protocol no requiring a steroids, remdesivir or any other intervention. -Okay to continue vitamin C and supportive care -Tylenol if needed for comfort and fever control.  3-hypokalemia -Repleted and stable -Repeat basic metabolic panel follow-up visit to reassess electrolytes trend.  4-history of seizures -Continue Keppra -No signs of seizure activity appreciated during hospitalization.  5-prior history of TIA/CVA -No acute neurologic deficits -Continue Eliquis for secondary prevention. -Continue risk factors stratification.  6-morbid obesity -Body mass index is 40.24 kg/m. - low calorie, portion control and lifestyle changes discussed with patient.  7-gastroesophageal reflux disease -Continue PRN PPI.  8-chronic pain syndrome/anxiety/Depression -Resume outpatient as needed Percocet and Ativan -Expressed pain is well controlled and her mood is a stable. -Continue the use of Lexapro and Cymbalta.  9-Hyperlipidemia -Continue Pravachol.  10-hypothyroidism -Continue Synthroid.  11-chronic atrial fibrillation/HTN/CAD -Continue the use of metoprolol, Eliquis and statin.  Procedures:  See below for x-ray report  Consultations:  GI service curbside (Dr. Laural Golden) recommendations to  check celiac panel and patient will closely be followed as an outpatient for capsule enteroscopy.  Discontinue aspirin.  Discharge Exam: Vitals:   10/20/19 0645 10/20/19 1400  BP: 120/62 (!) 119/51  Pulse: 80 77  Resp: 20 20  Temp: 99.3 F (37.4 C) 99.4 F (37.4 C)  SpO2: 99% 97%    General: No fever, no chest pain, no nausea, no vomiting, no overt bleeding appreciated.  Patient reports breathing at baseline and good oxygen saturation on room air appreciated. Cardiovascular: S1 and S2, no rubs, no gallops, unable to properly assess JVD due to body habitus. Respiratory: Good air movement bilaterally, no wheezing, no crackles. Abdomen: Obese, soft, nontender, nondistended, positive bowel sounds Extremities: No cyanosis, no clubbing  Discharge Instructions   Discharge Instructions    Diet - low sodium heart healthy   Complete by: As directed    Discharge instructions   Complete by: As directed    Stop Aspirin Outpatient follow up with Dr. Laural Golden (GI service, as instructed) Take medications as prescribed Maintain adequate hydration.   Increase activity slowly   Complete by: As directed      Allergies as of 10/20/2019      Reactions   Formaldehyde    Unknown   Latex Other (See Comments)   unknown      Medication List    STOP taking these medications   aspirin 325 MG EC tablet   ferrous sulfate 325 (65 FE) MG tablet     TAKE these medications   acetaminophen 325 MG tablet Commonly known as: TYLENOL Take 650 mg by mouth every 8 (eight) hours as needed.   apixaban 2.5 MG Tabs tablet Commonly known as: ELIQUIS Take 1 tablet (2.5 mg total) by mouth 2 (two) times daily for 30 days.   ascorbic acid 500 MG tablet Commonly known as: VITAMIN C Take  500 mg by mouth 2 (two) times daily.   DULoxetine 30 MG capsule Commonly known as: CYMBALTA Take 30 mg by mouth daily.   escitalopram 20 MG tablet Commonly known as: LEXAPRO Take 20 mg by mouth daily.   fluticasone  50 MCG/ACT nasal spray Commonly known as: FLONASE Place 1 spray into both nostrils at bedtime.   furosemide 20 MG tablet Commonly known as: LASIX Take 20 mg by mouth daily.   hydrOXYzine 25 MG tablet Commonly known as: ATARAX/VISTARIL Take 25 mg by mouth every 6 (six) hours as needed for itching.   ipratropium 0.03 % nasal spray Commonly known as: ATROVENT Place 2 sprays into both nostrils every 6 (six) hours as needed for rhinitis.   iron polysaccharides 150 MG capsule Commonly known as: NIFEREX Take 1 capsule (150 mg total) by mouth 2 (two) times daily.   isosorbide dinitrate 30 MG tablet Commonly known as: ISORDIL Take 30 mg by mouth daily.   levETIRAcetam 500 MG tablet Commonly known as: Keppra Take 1 tablet (500 mg total) by mouth 2 (two) times daily for 30 days.   levothyroxine 200 MCG tablet Commonly known as: SYNTHROID Take 200 mcg by mouth daily before breakfast.   LORazepam 0.5 MG tablet Commonly known as: ATIVAN Take 0.5 mg by mouth at bedtime.   Melatonin 3 MG Tabs Take 3 mg by mouth at bedtime.   metoprolol tartrate 25 MG tablet Commonly known as: LOPRESSOR Take 1 tablet (25 mg total) by mouth 2 (two) times daily. What changed: when to take this   nitroGLYCERIN 0.4 MG SL tablet Commonly known as: NITROSTAT Place 0.4 mg under the tongue every 5 (five) minutes as needed for chest pain.   ondansetron 4 MG tablet Commonly known as: ZOFRAN Take 4 mg by mouth every 8 (eight) hours as needed for nausea or vomiting.   oxyCODONE-acetaminophen 5-325 MG tablet Commonly known as: PERCOCET/ROXICET Take 1 tablet by mouth every 12 (twelve) hours as needed for moderate pain or severe pain.   polyethylene glycol powder 17 GM/SCOOP powder Commonly known as: GLYCOLAX/MIRALAX Take 17 g by mouth daily.   pravastatin 80 MG tablet Commonly known as: PRAVACHOL Take 80 mg by mouth daily.   pregabalin 75 MG capsule Commonly known as: LYRICA Take 75 mg by mouth 2  (two) times daily.   senna 8.6 MG Tabs tablet Commonly known as: SENOKOT Take 1 tablet by mouth 2 (two) times daily.   vitamin B-12 1000 MCG tablet Commonly known as: CYANOCOBALAMIN Take 1,000 mcg by mouth daily.      Allergies  Allergen Reactions  . Formaldehyde     Unknown  . Latex Other (See Comments)    unknown   Follow-up Information    Karen Renshaw, MD. Schedule an appointment as soon as possible for a visit in 10 day(s).   Specialty: Internal Medicine Contact information: [place] [address] [phone]        Rogene Houston, MD Follow up.   Specialty: Gastroenterology Why: office will contact with appointment details.  Contact information: [address], [address] [place] [address] [phone]           The results of significant diagnostics from this hospitalization (including imaging, microbiology, ancillary and laboratory) are listed below for reference.    Significant Diagnostic Studies: DG Chest Port 1 View  Result Date: 10/19/2019 CLINICAL DATA:  Anemia, COVID positive EXAM: PORTABLE CHEST 1 VIEW COMPARISON:  02/08/2019 FINDINGS: Mild chronic interstitial prominence. No  pleural effusion or pneumothorax. Enlargement of the cardiomediastinal silhouette reflecting cardiomegaly. IMPRESSION: No acute process in the chest.  Cardiomegaly. Electronically Signed   By: Macy Mis M.D.   On: 10/19/2019 17:32    Labs: Basic Metabolic Panel: Recent Labs  Lab 10/19/19 1512 10/20/19 1452  NA 144 141  K 3.4* 3.5  CL 108 108  CO2 30 28  GLUCOSE 130* 98  BUN 12 11  CREATININE 0.78 0.65  CALCIUM 7.7* 7.6*  MG 2.1 2.2   Liver Function Tests: Recent Labs  Lab 10/19/19 1512 10/20/19 1452  AST 15 15  ALT 11 10  ALKPHOS 56 62  BILITOT 0.5 0.9  PROT 6.1* 6.0*  ALBUMIN 2.7* 2.8*   CBC: Recent Labs  Lab 10/19/19 1512 10/20/19 1452  WBC 3.0* 3.3*  NEUTROABS 1.6* 1.7  HGB 5.6* 7.9*  HCT 23.0* 29.2*  MCV 77.4* 80.2  PLT  220 184    Signed:  Barton Dubois MD.  [facility] 10/20/2019, 3:47 PM

## 2019-10-20 NOTE — Progress Notes (Signed)
UNMATCHED BLOOD PRODUCT NOTE  Compare the patient ID on the blood tag to the patient ID on the hospital armband and Blood Bank armband. Then confirm the unit number on the blood tag matches the unit number on the blood product.  If a discrepancy is discovered return the product to blood bank immediately.   Blood Product Type: Packed Red Blood Cells  Unit #: [id number]  Product Code #: [id number]   Start Time: [id number]  Starting Rate: 125 ml/hr  Rate increase/decreased  (if applicable):      ml/hr  Rate changed time (if applicable):    Stop Time: 72 Verified by second nurse: Mariam Dollar   All Other Documentation should be documented within the Blood Admin Flowsheet per policy.

## 2019-10-20 NOTE — Plan of Care (Signed)

## 2019-10-20 NOTE — Progress Notes (Signed)
MEDICATION RELATED CONSULT NOTE - INITIAL   Pharmacy Consult for Feraheme Indication: Iron deficiency anemia  Allergies  Allergen Reactions  . Formaldehyde     Unknown  . Latex Other (See Comments)    unknown    Patient Measurements: Height: 5\' 6"  (167.6 cm) Weight: 249 lb 5.4 oz (113.1 kg) IBW/kg (Calculated) : 59.3   Vital Signs: Temp: 99.3 F (37.4 C) (01/06 0645) Temp Source: Oral (01/06 0645) BP: 120/62 (01/06 0645) Pulse Rate: 80 (01/06 0645) Intake/Output from previous day: 01/05 0701 - 01/06 0700 In: 630 [Blood:630] Out: 100 [Urine:100] Intake/Output from this shift: No intake/output data recorded.  Labs: Recent Labs    10/19/19 1512  WBC 3.0*  HGB 5.6*  HCT 23.0*  PLT 220  CREATININE 0.78  MG 2.1  ALBUMIN 2.7*  PROT 6.1*  AST 15  ALT 11  ALKPHOS 56  BILITOT 0.5  BILIDIR 0.1  IBILI 0.4   Estimated Creatinine Clearance: 88.2 mL/min (by C-G formula based on SCr of 0.78 mg/dL).   Microbiology: No results found for this or any previous visit (from the past 720 hour(s)).  Medical History: Past Medical History:  Diagnosis Date  . Anxiety   . Atherosclerotic heart disease of native coronary artery without angina pectoris   . Bipolar 1 disorder, depressed ([place])   . Chronic pain syndrome   . COPD (chronic obstructive pulmonary disease) ([place])   . COVID-19   . Depression   . Essential (primary) hypertension   . Gastroesophageal reflux   . GERD without esophagitis   . Heart failure ([place])   . Heart failure, unspecified ([place])   . Hemiparesis ([place])   . Hemiplegia ([place])   . Herpesviral infection, unspecified   . Hyperlipidemia   . Hypothyroidism   . Iron deficiency anemia   . Long term (current) use of anticoagulants   . Major depressive disorder   . Opioid dependence, uncomplicated ([place])   . Other disorders of plasma-protein metabolism, not elsewhere classified   . Paraplegia, unspecified ([place])   . Personal history of other diseases of the  circulatory system   . Phonological disorder   . Presence of aortocoronary bypass graft   . Seizures ([place])   . Stroke ([place])   . Unspecified convulsions ([place])   . Vitamin D deficiency     Medications:  Medications Prior to Admission  Medication Sig Dispense Refill Last Dose  . acetaminophen (TYLENOL) 325 MG tablet Take 650 mg by mouth every 8 (eight) hours as needed.     Marland Kitchen apixaban (ELIQUIS) 2.5 MG TABS tablet Take 1 tablet (2.5 mg total) by mouth 2 (two) times daily for 30 days. 60 tablet 2   . ascorbic acid (VITAMIN C) 500 MG tablet Take 500 mg by mouth 2 (two) times daily.     Marland Kitchen aspirin EC 325 MG EC tablet Take 1 tablet (325 mg total) by mouth daily. 30 tablet 0   . DULoxetine (CYMBALTA) 30 MG capsule Take 30 mg by mouth daily.      Marland Kitchen escitalopram (LEXAPRO) 20 MG tablet Take 20 mg by mouth daily.     . ferrous sulfate 325 (65 FE) MG tablet Take 1 tablet (325 mg total) by mouth daily with breakfast. (Patient taking differently: Take 325 mg by mouth 2 (two) times daily with a meal. ) 30 tablet 3   . fluticasone (FLONASE) 50 MCG/ACT nasal spray Place 1 spray into both nostrils at bedtime.     . furosemide (LASIX) 20 MG  tablet Take 20 mg by mouth daily.      . hydrOXYzine (ATARAX/VISTARIL) 25 MG tablet Take 25 mg by mouth every 6 (six) hours as needed for itching.      Marland Kitchen ipratropium (ATROVENT) 0.03 % nasal spray Place 2 sprays into both nostrils every 6 (six) hours as needed for rhinitis.     Marland Kitchen isosorbide dinitrate (ISORDIL) 30 MG tablet Take 30 mg by mouth daily.     Marland Kitchen levETIRAcetam (KEPPRA) 500 MG tablet Take 1 tablet (500 mg total) by mouth 2 (two) times daily for 30 days. 60 tablet 0   . levothyroxine (SYNTHROID, LEVOTHROID) 200 MCG tablet Take 200 mcg by mouth daily before breakfast.     . LORazepam (ATIVAN) 0.5 MG tablet Take 0.5 mg by mouth at bedtime.     . Melatonin 3 MG TABS Take 3 mg by mouth at bedtime.      . metoprolol tartrate (LOPRESSOR) 25 MG tablet Take 1 tablet (25 mg  total) by mouth 2 (two) times daily. (Patient taking differently: Take 25 mg by mouth daily. ) 180 tablet 3   . nitroGLYCERIN (NITROSTAT) 0.4 MG SL tablet Place 0.4 mg under the tongue every 5 (five) minutes as needed for chest pain.     Marland Kitchen ondansetron (ZOFRAN) 4 MG tablet Take 4 mg by mouth every 8 (eight) hours as needed for nausea or vomiting.      Marland Kitchen oxyCODONE-acetaminophen (PERCOCET/ROXICET) 5-325 MG tablet Take 1 tablet by mouth every 12 (twelve) hours as needed for moderate pain or severe pain.      . polyethylene glycol powder (GLYCOLAX/MIRALAX) powder Take 17 g by mouth daily.     . pravastatin (PRAVACHOL) 80 MG tablet Take 80 mg by mouth daily.     . pregabalin (LYRICA) 75 MG capsule Take 75 mg by mouth 2 (two) times daily.      Marland Kitchen senna (SENOKOT) 8.6 MG TABS tablet Take 1 tablet by mouth 2 (two) times daily.     . vitamin B-12 (CYANOCOBALAMIN) 1000 MCG tablet Take 1,000 mcg by mouth daily.       Assessment: Pharmacy consulted to dose feraheme in patient with iron deficiency anemia.  Patient's hemoglobin currently 5.6 and receiving 2 units of PRBC  Plan:  Feraheme 510 mg IV x 1 dose. Follow-up additional dose in 1 week if needed.  Margot Ables, PharmD Clinical Pharmacist 10/20/2019 8:54 AM

## 2019-10-21 LAB — TYPE AND SCREEN
ABO/RH(D): O POS
Antibody Screen: POSITIVE
Donor AG Type: NEGATIVE
Donor AG Type: NEGATIVE
Unit division: 0
Unit division: 0

## 2019-10-21 LAB — BPAM RBC
Blood Product Expiration Date: 202101252359
Blood Product Expiration Date: 202101252359
ISSUE DATE / TIME: 202101052206
ISSUE DATE / TIME: 202101052206
Unit Type and Rh: 5100
Unit Type and Rh: 5100

## 2019-10-21 LAB — TISSUE TRANSGLUTAMINASE, IGA: Tissue Transglutaminase Ab, IgA: <2 U/mL (ref 0–3)

## 2019-10-22 LAB — GLIADIN ANTIBODIES, SERUM
Antigliadin Abs, IgA: 7 units (ref 0–19)
Gliadin IgG: 3 units (ref 0–19)

## 2019-10-23 LAB — RETICULIN ANTIBODIES, IGA W TITER: Reticulin Ab, IgA: NEGATIVE titer (ref <2.5)

## 2021-03-07 ENCOUNTER — Encounter (HOSPITAL_COMMUNITY): Payer: Self-pay | Admitting: Emergency Medicine

## 2021-03-07 ENCOUNTER — Inpatient Hospital Stay (HOSPITAL_COMMUNITY)
Admission: EM | Admit: 2021-03-07 | Discharge: 2021-03-10 | DRG: 391 | Disposition: A | Discharge status: Skilled Nursing Facility | Payer: Medicare (Managed Care) | Source: Skilled Nursing Facility | Attending: Family Medicine | Admitting: Family Medicine

## 2021-03-07 ENCOUNTER — Other Ambulatory Visit: Payer: Self-pay

## 2021-03-07 ENCOUNTER — Emergency Department (HOSPITAL_COMMUNITY): Payer: Medicare (Managed Care)

## 2021-03-07 DIAGNOSIS — Z8616 Personal history of COVID-19: Secondary | ICD-10-CM | POA: Diagnosis not present

## 2021-03-07 DIAGNOSIS — R1013 Epigastric pain: Secondary | ICD-10-CM | POA: Diagnosis not present

## 2021-03-07 DIAGNOSIS — D72829 Elevated white blood cell count, unspecified: Secondary | ICD-10-CM | POA: Diagnosis not present

## 2021-03-07 DIAGNOSIS — K219 Gastro-esophageal reflux disease without esophagitis: Secondary | ICD-10-CM | POA: Diagnosis present

## 2021-03-07 DIAGNOSIS — Z79890 Hormone replacement therapy (postmenopausal): Secondary | ICD-10-CM

## 2021-03-07 DIAGNOSIS — R109 Unspecified abdominal pain: Secondary | ICD-10-CM

## 2021-03-07 DIAGNOSIS — R739 Hyperglycemia, unspecified: Secondary | ICD-10-CM | POA: Diagnosis present

## 2021-03-07 DIAGNOSIS — E785 Hyperlipidemia, unspecified: Secondary | ICD-10-CM | POA: Diagnosis present

## 2021-03-07 DIAGNOSIS — A059 Bacterial foodborne intoxication, unspecified: Secondary | ICD-10-CM | POA: Diagnosis present

## 2021-03-07 DIAGNOSIS — Z7401 Bed confinement status: Secondary | ICD-10-CM

## 2021-03-07 DIAGNOSIS — I110 Hypertensive heart disease with heart failure: Secondary | ICD-10-CM | POA: Diagnosis present

## 2021-03-07 DIAGNOSIS — I2510 Atherosclerotic heart disease of native coronary artery without angina pectoris: Secondary | ICD-10-CM | POA: Diagnosis present

## 2021-03-07 DIAGNOSIS — R000 Tachycardia, unspecified: Secondary | ICD-10-CM | POA: Diagnosis present

## 2021-03-07 DIAGNOSIS — D509 Iron deficiency anemia, unspecified: Secondary | ICD-10-CM | POA: Diagnosis present

## 2021-03-07 DIAGNOSIS — K922 Gastrointestinal hemorrhage, unspecified: Secondary | ICD-10-CM | POA: Diagnosis present

## 2021-03-07 DIAGNOSIS — F419 Anxiety disorder, unspecified: Secondary | ICD-10-CM | POA: Diagnosis present

## 2021-03-07 DIAGNOSIS — I10 Essential (primary) hypertension: Secondary | ICD-10-CM | POA: Diagnosis not present

## 2021-03-07 DIAGNOSIS — J449 Chronic obstructive pulmonary disease, unspecified: Secondary | ICD-10-CM | POA: Diagnosis present

## 2021-03-07 DIAGNOSIS — K625 Hemorrhage of anus and rectum: Secondary | ICD-10-CM | POA: Diagnosis not present

## 2021-03-07 DIAGNOSIS — Z888 Allergy status to other drugs, medicaments and biological substances status: Secondary | ICD-10-CM

## 2021-03-07 DIAGNOSIS — Z8673 Personal history of transient ischemic attack (TIA), and cerebral infarction without residual deficits: Secondary | ICD-10-CM | POA: Diagnosis not present

## 2021-03-07 DIAGNOSIS — Z79899 Other long term (current) drug therapy: Secondary | ICD-10-CM

## 2021-03-07 DIAGNOSIS — R112 Nausea with vomiting, unspecified: Secondary | ICD-10-CM

## 2021-03-07 DIAGNOSIS — Z87891 Personal history of nicotine dependence: Secondary | ICD-10-CM | POA: Diagnosis not present

## 2021-03-07 DIAGNOSIS — Z7901 Long term (current) use of anticoagulants: Secondary | ICD-10-CM

## 2021-03-07 DIAGNOSIS — K529 Noninfective gastroenteritis and colitis, unspecified: Principal | ICD-10-CM | POA: Diagnosis present

## 2021-03-07 DIAGNOSIS — I639 Cerebral infarction, unspecified: Secondary | ICD-10-CM | POA: Diagnosis present

## 2021-03-07 DIAGNOSIS — E039 Hypothyroidism, unspecified: Secondary | ICD-10-CM | POA: Diagnosis not present

## 2021-03-07 DIAGNOSIS — Z91040 Latex allergy status: Secondary | ICD-10-CM

## 2021-03-07 DIAGNOSIS — Z951 Presence of aortocoronary bypass graft: Secondary | ICD-10-CM

## 2021-03-07 DIAGNOSIS — K226 Gastro-esophageal laceration-hemorrhage syndrome: Secondary | ICD-10-CM | POA: Diagnosis present

## 2021-03-07 DIAGNOSIS — E782 Mixed hyperlipidemia: Secondary | ICD-10-CM | POA: Diagnosis not present

## 2021-03-07 DIAGNOSIS — Z993 Dependence on wheelchair: Secondary | ICD-10-CM

## 2021-03-07 DIAGNOSIS — R197 Diarrhea, unspecified: Secondary | ICD-10-CM | POA: Diagnosis not present

## 2021-03-07 DIAGNOSIS — I509 Heart failure, unspecified: Secondary | ICD-10-CM | POA: Diagnosis present

## 2021-03-07 DIAGNOSIS — Z6841 Body Mass Index (BMI) 40.0 and over, adult: Secondary | ICD-10-CM

## 2021-03-07 DIAGNOSIS — Z20822 Contact with and (suspected) exposure to covid-19: Secondary | ICD-10-CM | POA: Diagnosis present

## 2021-03-07 DIAGNOSIS — E559 Vitamin D deficiency, unspecified: Secondary | ICD-10-CM | POA: Diagnosis present

## 2021-03-07 DIAGNOSIS — R9431 Abnormal electrocardiogram [ECG] [EKG]: Secondary | ICD-10-CM | POA: Diagnosis not present

## 2021-03-07 DIAGNOSIS — K920 Hematemesis: Secondary | ICD-10-CM | POA: Diagnosis not present

## 2021-03-07 DIAGNOSIS — G894 Chronic pain syndrome: Secondary | ICD-10-CM | POA: Diagnosis present

## 2021-03-07 DIAGNOSIS — Z8249 Family history of ischemic heart disease and other diseases of the circulatory system: Secondary | ICD-10-CM

## 2021-03-07 LAB — CBC WITH DIFFERENTIAL/PLATELET
Abs Immature Granulocytes: 0.12 10*3/uL — ABNORMAL HIGH (ref 0.00–0.07)
Basophils Absolute: 0.1 10*3/uL (ref 0.0–0.1)
Basophils Relative: 0 %
Eosinophils Absolute: 0.1 10*3/uL (ref 0.0–0.5)
Eosinophils Relative: 0 %
HCT: 43.1 % (ref 36.0–46.0)
Hemoglobin: 12.6 g/dL (ref 12.0–15.0)
Immature Granulocytes: 1 %
Lymphocytes Relative: 5 %
Lymphs Abs: 1.1 10*3/uL (ref 0.7–4.0)
MCH: 25.4 pg — ABNORMAL LOW (ref 26.0–34.0)
MCHC: 29.2 g/dL — ABNORMAL LOW (ref 30.0–36.0)
MCV: 86.7 fL (ref 80.0–100.0)
Monocytes Absolute: 0.9 10*3/uL (ref 0.1–1.0)
Monocytes Relative: 4 %
Neutro Abs: 19.6 10*3/uL — ABNORMAL HIGH (ref 1.7–7.7)
Neutrophils Relative %: 90 %
Platelets: 309 10*3/uL (ref 150–400)
RBC: 4.97 MIL/uL (ref 3.87–5.11)
RDW: 15.5 % (ref 11.5–15.5)
WBC: 21.8 10*3/uL — ABNORMAL HIGH (ref 4.0–10.5)
nRBC: 0 % (ref 0.0–0.2)

## 2021-03-07 LAB — TYPE AND SCREEN
ABO/RH(D): O POS
Antibody Screen: POSITIVE

## 2021-03-07 LAB — C DIFFICILE QUICK SCREEN W PCR REFLEX
C Diff antigen: NEGATIVE
C Diff interpretation: NOT DETECTED
C Diff toxin: NEGATIVE

## 2021-03-07 LAB — COMPREHENSIVE METABOLIC PANEL
ALT: 11 U/L (ref 0–44)
AST: 15 U/L (ref 15–41)
Albumin: 3.2 g/dL — ABNORMAL LOW (ref 3.5–5.0)
Alkaline Phosphatase: 113 U/L (ref 38–126)
Anion gap: 8 (ref 5–15)
BUN: 10 mg/dL (ref 8–23)
CO2: 26 mmol/L (ref 22–32)
Calcium: 8.7 mg/dL — ABNORMAL LOW (ref 8.9–10.3)
Chloride: 106 mmol/L (ref 98–111)
Creatinine, Ser: 0.76 mg/dL (ref 0.44–1.00)
GFR, Estimated: >60 mL/min (ref >60)
Glucose, Bld: 169 mg/dL — ABNORMAL HIGH (ref 70–99)
Potassium: 3.6 mmol/L (ref 3.5–5.1)
Sodium: 140 mmol/L (ref 135–145)
Total Bilirubin: 0.5 mg/dL (ref 0.3–1.2)
Total Protein: 7.4 g/dL (ref 6.5–8.1)

## 2021-03-07 LAB — OCCULT BLOOD GASTRIC / DUODENUM (SPECIMEN CUP)
Occult Blood, Gastric: POSITIVE — AB
pH, Gastric: 2

## 2021-03-07 LAB — RESP PANEL BY RT-PCR (FLU A&B, COVID) ARPGX2
Influenza A by PCR: NEGATIVE
Influenza B by PCR: NEGATIVE
SARS Coronavirus 2 by RT PCR: NEGATIVE

## 2021-03-07 LAB — HEMOGLOBIN AND HEMATOCRIT, BLOOD
HCT: 44.5 % (ref 36.0–46.0)
HCT: 42.7 % (ref 36.0–46.0)
Hemoglobin: 13 g/dL (ref 12.0–15.0)
Hemoglobin: 12.6 g/dL (ref 12.0–15.0)

## 2021-03-07 LAB — PROCALCITONIN: Procalcitonin: <0.1 ng/mL

## 2021-03-07 LAB — HIV ANTIBODY (ROUTINE TESTING W REFLEX): HIV Screen 4th Generation wRfx: NONREACTIVE

## 2021-03-07 LAB — MRSA PCR SCREENING: MRSA by PCR: NEGATIVE

## 2021-03-07 LAB — LIPASE, BLOOD: Lipase: 23 U/L (ref 11–51)

## 2021-03-07 MED ORDER — SODIUM CHLORIDE 0.9 % IV SOLN: Freq: Once | INTRAVENOUS | Status: AC

## 2021-03-07 MED ORDER — PANTOPRAZOLE SODIUM 40 MG IV SOLR: 40.0000 mg | Freq: Two times a day (BID) | INTRAVENOUS | Status: DC

## 2021-03-07 MED ORDER — SODIUM CHLORIDE 0.9 % IV SOLN
8.0000 mg/h | INTRAVENOUS | Status: DC
  Administered 2021-03-07: 8 mg/h via INTRAVENOUS
  Filled 2021-03-07 (×4): qty 80

## 2021-03-07 MED ORDER — PROCHLORPERAZINE EDISYLATE 10 MG/2ML IJ SOLN: 10.0000 mg | Freq: Four times a day (QID) | INTRAMUSCULAR | Status: DC | PRN

## 2021-03-07 MED ORDER — FLUTICASONE PROPIONATE 50 MCG/ACT NA SUSP
1.0000 | Freq: Every day | NASAL | Status: DC
  Administered 2021-03-07 – 2021-03-09 (×3): 1 via NASAL
  Filled 2021-03-07: qty 16

## 2021-03-07 MED ORDER — IPRATROPIUM BROMIDE 0.03 % NA SOLN: 2.0000 | Freq: Four times a day (QID) | NASAL | Status: DC | PRN

## 2021-03-07 MED ORDER — LEVETIRACETAM 500 MG PO TABS
500.0000 mg | ORAL_TABLET | Freq: Two times a day (BID) | ORAL | Status: DC
  Administered 2021-03-07 – 2021-03-10 (×6): 500 mg via ORAL
  Filled 2021-03-07 (×7): qty 1

## 2021-03-07 MED ORDER — ONDANSETRON HCL 4 MG/2ML IJ SOLN
4.0000 mg | Freq: Once | INTRAMUSCULAR | Status: AC
  Administered 2021-03-07: 4 mg via INTRAVENOUS
  Filled 2021-03-07: qty 2

## 2021-03-07 MED ORDER — SODIUM CHLORIDE 0.9 % IV SOLN
80.0000 mg | Freq: Once | INTRAVENOUS | Status: AC
  Administered 2021-03-07: 80 mg via INTRAVENOUS
  Filled 2021-03-07: qty 80

## 2021-03-07 MED ORDER — NITROGLYCERIN 0.4 MG SL SUBL: 0.4000 mg | SUBLINGUAL_TABLET | SUBLINGUAL | Status: DC | PRN

## 2021-03-07 MED ORDER — ISOSORBIDE MONONITRATE ER 30 MG PO TB24
30.0000 mg | ORAL_TABLET | Freq: Every day | ORAL | Status: DC
  Administered 2021-03-08 – 2021-03-10 (×3): 30 mg via ORAL
  Filled 2021-03-07 (×4): qty 1

## 2021-03-07 MED ORDER — PRAVASTATIN SODIUM 40 MG PO TABS
80.0000 mg | ORAL_TABLET | Freq: Every evening | ORAL | Status: DC
  Administered 2021-03-07 – 2021-03-10 (×4): 80 mg via ORAL
  Filled 2021-03-07 (×4): qty 2

## 2021-03-07 MED ORDER — SODIUM CHLORIDE 0.9 % IV SOLN
50.0000 ug/h | INTRAVENOUS | Status: DC
  Administered 2021-03-07: 50 ug/h via INTRAVENOUS
  Filled 2021-03-07 (×4): qty 1

## 2021-03-07 MED ORDER — ASCORBIC ACID 500 MG PO TABS
500.0000 mg | ORAL_TABLET | Freq: Two times a day (BID) | ORAL | Status: DC
  Administered 2021-03-08 – 2021-03-10 (×4): 500 mg via ORAL
  Filled 2021-03-07 (×6): qty 1

## 2021-03-07 MED ORDER — METOCLOPRAMIDE HCL 5 MG/ML IJ SOLN
5.0000 mg | Freq: Once | INTRAMUSCULAR | Status: AC
  Administered 2021-03-07: 5 mg via INTRAVENOUS
  Filled 2021-03-07: qty 2

## 2021-03-07 MED ORDER — SODIUM CHLORIDE 0.9 % IV BOLUS
500.0000 mL | Freq: Once | INTRAVENOUS | Status: AC
  Administered 2021-03-07: 500 mL via INTRAVENOUS

## 2021-03-07 MED ORDER — DULOXETINE HCL 30 MG PO CPEP
30.0000 mg | ORAL_CAPSULE | Freq: Every day | ORAL | Status: DC
  Administered 2021-03-07 – 2021-03-10 (×4): 30 mg via ORAL
  Filled 2021-03-07 (×4): qty 1

## 2021-03-07 MED ORDER — VITAMIN B-12 1000 MCG PO TABS
1000.0000 ug | ORAL_TABLET | Freq: Every day | ORAL | Status: DC
  Administered 2021-03-08 – 2021-03-10 (×3): 1000 ug via ORAL
  Filled 2021-03-07 (×4): qty 1

## 2021-03-07 MED ORDER — PREGABALIN 75 MG PO CAPS
150.0000 mg | ORAL_CAPSULE | Freq: Two times a day (BID) | ORAL | Status: DC
  Administered 2021-03-07 – 2021-03-10 (×6): 150 mg via ORAL
  Filled 2021-03-07 (×6): qty 2

## 2021-03-07 MED ORDER — METOPROLOL TARTRATE 25 MG PO TABS
25.0000 mg | ORAL_TABLET | Freq: Two times a day (BID) | ORAL | Status: DC
  Administered 2021-03-07 – 2021-03-08 (×2): 25 mg via ORAL
  Filled 2021-03-07 (×2): qty 1

## 2021-03-07 MED ORDER — FUROSEMIDE 20 MG PO TABS
20.0000 mg | ORAL_TABLET | Freq: Every day | ORAL | Status: DC
  Administered 2021-03-07 – 2021-03-10 (×4): 20 mg via ORAL
  Filled 2021-03-07 (×4): qty 1

## 2021-03-07 MED ORDER — MELATONIN 3 MG PO TABS
3.0000 mg | ORAL_TABLET | Freq: Every day | ORAL | Status: DC
  Administered 2021-03-07 – 2021-03-09 (×3): 3 mg via ORAL
  Filled 2021-03-07 (×3): qty 1

## 2021-03-07 MED ORDER — OXYCODONE-ACETAMINOPHEN 5-325 MG PO TABS: 1.0000 | ORAL_TABLET | Freq: Two times a day (BID) | ORAL | Status: DC | PRN

## 2021-03-07 MED ORDER — OCTREOTIDE LOAD VIA INFUSION
50.0000 ug | Freq: Once | INTRAVENOUS | Status: AC
  Administered 2021-03-07: 50 ug via INTRAVENOUS
  Filled 2021-03-07: qty 25

## 2021-03-07 MED ORDER — LEVOTHYROXINE SODIUM 137 MCG PO TABS
137.0000 ug | ORAL_TABLET | Freq: Every day | ORAL | Status: DC
  Administered 2021-03-07 – 2021-03-10 (×4): 137 ug via ORAL
  Filled 2021-03-07 (×4): qty 1

## 2021-03-07 MED ORDER — POLYSACCHARIDE IRON COMPLEX 150 MG PO CAPS
150.0000 mg | ORAL_CAPSULE | Freq: Two times a day (BID) | ORAL | Status: DC
  Administered 2021-03-07 – 2021-03-08 (×2): 150 mg via ORAL
  Filled 2021-03-07 (×4): qty 1

## 2021-03-07 MED ORDER — PANTOPRAZOLE SODIUM 40 MG IV SOLR
40.0000 mg | Freq: Two times a day (BID) | INTRAVENOUS | Status: DC
  Administered 2021-03-07 – 2021-03-10 (×7): 40 mg via INTRAVENOUS
  Filled 2021-03-07 (×7): qty 40

## 2021-03-07 MED ORDER — LORAZEPAM 0.5 MG PO TABS
0.5000 mg | ORAL_TABLET | Freq: Every day | ORAL | Status: DC
  Administered 2021-03-07 – 2021-03-09 (×3): 0.5 mg via ORAL
  Filled 2021-03-07 (×3): qty 1

## 2021-03-07 MED ORDER — METOPROLOL TARTRATE 25 MG PO TABS
25.0000 mg | ORAL_TABLET | Freq: Every day | ORAL | Status: DC
  Administered 2021-03-07: 25 mg via ORAL
  Filled 2021-03-07: qty 1

## 2021-03-07 NOTE — ED Notes (Signed)
Pt cleaned. New brief applied. Pt repositioned in bed for comfort and denied any other needs at this time. Call bell within reach.

## 2021-03-07 NOTE — H&P (Signed)
History and Physical  KIYOKO MCGUIRT SWN:[id number] DOB: [birth year] DOA: 03/07/2021  Referring physician: Orpah Greek, MD PCP: Caprice Renshaw, MD  Patient coming from: SNF  Chief Complaint: Nausea, vomiting, abdominal pain   HPI: Karen Mccann is a 70 y.o. 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  Patient was treated at the nursing facility with Imodium and Zofran without relief.  She also complains of pain in the abdomen, pain was described as burning sensation with radiation to the chest.  EMS was activated and patient was taken to the ED for further evaluation and management.  Patient is bedbound and wheelchair-bound at baseline due to prior multiple ischemic strokes.  ED Course:  In the emergency department, she was tachycardic and intermittently tachypneic.  BP was 157/103, temperature 98.51F and O2 sat 96% on room air.  Work-up in the ED showed normal CBC except for leukocytosis (21.8) and normal BMP except for hyperglycemia.  Albumin 3.2.  Patient was reported to have an episode of hematemesis and FOBT was positive.  Influenza A, B and SARS coronavirus 2 was negative. CT abdomen pelvis without contrast showed partially visualized moderate volume hiatal hernia and nonobstructive 1.1 cm right nephrolithiasis. Patient was treated with IV Reglan, and she was started on IV Protonix and octreotide drip.  IV hydration was provided.  Hospitalist was asked to admit patient for further evaluation and management.  Review of Systems: Constitutional: Negative for chills and fever.  HENT: Negative for ear pain and sore throat.   Eyes: Negative for pain and visual disturbance.  Respiratory: Negative for cough, chest  tightness and shortness of breath.   Cardiovascular: Negative for chest pain and palpitations.  Gastrointestinal: Positive for abdominal pain, diarrhea, nausea and vomiting.  Endocrine: Negative for polyphagia and polyuria.  Genitourinary: Negative for decreased urine volume, dysuria, enuresis Musculoskeletal: Negative for arthralgias and back pain.  Skin: Negative for color change and rash.  Allergic/Immunologic: Negative for immunocompromised state.  Neurological: Negative for tremors, syncope, speech difficult and headaches.  Hematological: Does not bruise/bleed easily.  All other systems reviewed and are negative   Past Medical History:  Diagnosis Date  . Anxiety   . Atherosclerotic heart disease of native coronary artery without angina pectoris   . Bipolar 1 disorder, depressed ([place])   . Chronic pain syndrome   . COPD (chronic obstructive pulmonary disease) ([place])   . COVID-19   . Depression   . Essential (primary) hypertension   . Gastroesophageal reflux   . GERD without esophagitis   . Heart failure ([place])   . Heart failure, unspecified ([place])   . Hemiparesis ([place])   . Hemiplegia ([place])   . Herpesviral infection, unspecified   . Hyperlipidemia   . Hypothyroidism   . Iron deficiency anemia   . Long term (current) use of anticoagulants   . Major depressive disorder   . Opioid dependence, uncomplicated ([place])   . Other disorders of plasma-protein metabolism, not elsewhere classified   . Paraplegia, unspecified ([place])   . Personal history of other diseases of the circulatory system   . Phonological disorder   . Presence of aortocoronary bypass graft   . Seizures ([place])   . Stroke ([place])   . Unspecified convulsions ([place])   . Vitamin D deficiency  Past Surgical History:  Procedure Laterality Date  . CARDIAC SURGERY    . COLONOSCOPY WITH PROPOFOL N/A 05/14/2018   Procedure: COLONOSCOPY WITH PROPOFOL;  Surgeon: Rogene Houston, MD;  Location: [facility];  Service:  Endoscopy;  Laterality: N/A;  . ESOPHAGOGASTRODUODENOSCOPY (EGD) WITH PROPOFOL N/A 05/14/2018   Procedure: ESOPHAGOGASTRODUODENOSCOPY (EGD) WITH PROPOFOL;  Surgeon: Rogene Houston, MD;  Location: [facility];  Service: Endoscopy;  Laterality: N/A;  . POLYPECTOMY  05/14/2018   Procedure: POLYPECTOMY;  Surgeon: Rogene Houston, MD;  Location: [facility];  Service: Endoscopy;;  cecal polyp  . THYROID SURGERY     70 yrs old    Social History:  reports that she quit smoking about 17 years ago. Her smoking use included cigarettes. She smoked 0.50 packs per day. She has never used smokeless tobacco. She reports that she does not drink alcohol and does not use drugs.   Allergies  Allergen Reactions  . Formaldehyde     Unknown  . Latex Other (See Comments)    unknown    Family History  Problem Relation Age of Onset  . Hypertension Other      Prior to Admission medications   Medication Sig Start Date End Date Taking? Authorizing Provider  acetaminophen (TYLENOL) 325 MG tablet Take 650 mg by mouth every 8 (eight) hours as needed.    [provider]  apixaban (ELIQUIS) 2.5 MG TABS tablet Take 1 tablet (2.5 mg total) by mouth 2 (two) times daily for 30 days. 02/09/19 03/11/19  Manuella Ghazi, Pratik D, DO  ascorbic acid (VITAMIN C) 500 MG tablet Take 500 mg by mouth 2 (two) times daily.    [provider]  DULoxetine (CYMBALTA) 30 MG capsule Take 30 mg by mouth daily.     [provider]  escitalopram (LEXAPRO) 20 MG tablet Take 20 mg by mouth daily.    [provider]  fluticasone (FLONASE) 50 MCG/ACT nasal spray Place 1 spray into both nostrils at bedtime.    [provider]  furosemide (LASIX) 20 MG tablet Take 20 mg by mouth daily.     [provider]  hydrOXYzine (ATARAX/VISTARIL) 25 MG tablet Take 25 mg by mouth every 6 (six) hours as needed for itching.     [provider]  ipratropium (ATROVENT) 0.03 % nasal spray Place 2 sprays  into both nostrils every 6 (six) hours as needed for rhinitis.    [provider]  iron polysaccharides (NIFEREX) 150 MG capsule Take 1 capsule (150 mg total) by mouth 2 (two) times daily. 10/20/19   Barton Dubois, MD  isosorbide dinitrate (ISORDIL) 30 MG tablet Take 30 mg by mouth daily.    [provider]  levETIRAcetam (KEPPRA) 500 MG tablet Take 1 tablet (500 mg total) by mouth 2 (two) times daily for 30 days. 02/09/19 03/11/19  Manuella Ghazi, Pratik D, DO  levothyroxine (SYNTHROID, LEVOTHROID) 200 MCG tablet Take 200 mcg by mouth daily before breakfast.    [provider]  LORazepam (ATIVAN) 0.5 MG tablet Take 0.5 mg by mouth at bedtime.    [provider]  Melatonin 3 MG TABS Take 3 mg by mouth at bedtime.     [provider]  metoprolol tartrate (LOPRESSOR) 25 MG tablet Take 1 tablet (25 mg total) by mouth 2 (two) times daily. Patient taking differently: Take 25 mg by mouth daily.  05/16/15   Arnoldo Lenis, MD  nitroGLYCERIN (NITROSTAT) 0.4 MG SL tablet Place 0.4 mg  under the tongue every 5 (five) minutes as needed for chest pain.    [provider]  ondansetron (ZOFRAN) 4 MG tablet Take 4 mg by mouth every 8 (eight) hours as needed for nausea or vomiting.     [provider]  oxyCODONE-acetaminophen (PERCOCET/ROXICET) 5-325 MG tablet Take 1 tablet by mouth every 12 (twelve) hours as needed for moderate pain or severe pain.     [provider]  polyethylene glycol powder (GLYCOLAX/MIRALAX) powder Take 17 g by mouth daily.    [provider]  pravastatin (PRAVACHOL) 80 MG tablet Take 80 mg by mouth daily.    [provider]  pregabalin (LYRICA) 75 MG capsule Take 75 mg by mouth 2 (two) times daily.     [provider]  senna (SENOKOT) 8.6 MG TABS tablet Take 1 tablet by mouth 2 (two) times daily.    [provider]  vitamin B-12 (CYANOCOBALAMIN) 1000 MCG tablet Take 1,000 mcg by mouth daily.     [provider]    Physical Exam: BP (!) 153/86 (BP Location: Right Arm)   Pulse (!) 110   Temp 98.4 F (36.9 C)   Resp 18   Ht 5\' 6"  (1.676 m)   Wt 114 kg   SpO2 93%   BMI 40.56 kg/m   . General: 70 y.o. year-old female well developed well nourished in no acute distress.  Alert and oriented x3. Marland Kitchen HEENT: NCAT, EOMI . Neck: Supple, trachea medial . Cardiovascular: Regular rate and rhythm with no rubs or gallops.  No thyromegaly or JVD noted.  No lower extremity edema. 2/4 pulses in all 4 extremities. Marland Kitchen Respiratory: Clear to auscultation with no wheezes or rales. Good inspiratory effort. . Abdomen: Soft, tender to palpation in epigastric.  Normal bowel sounds x4 quadrants. . Muskuloskeletal: No cyanosis, clubbing or edema noted bilaterally . Neuro: CN II-XII intact, sensation intact . Skin: No ulcerative lesions noted or rashes . Psychiatry: Mood is appropriate for condition and setting          Labs on Admission:  Basic Metabolic Panel: Recent Labs  Lab 03/07/21 0133  NA 140  K 3.6  CL 106  CO2 26  GLUCOSE 169*  BUN 10  CREATININE 0.76  CALCIUM 8.7*   Liver Function Tests: Recent Labs  Lab 03/07/21 0133  AST 15  ALT 11  ALKPHOS 113  BILITOT 0.5  PROT 7.4  ALBUMIN 3.2*   Recent Labs  Lab 03/07/21 0133  LIPASE 23   No results for input(s): AMMONIA in the last 168 hours. CBC: Recent Labs  Lab 03/07/21 0133  WBC 21.8*  NEUTROABS 19.6*  HGB 12.6  HCT 43.1  MCV 86.7  PLT 309   Cardiac Enzymes: No results for input(s): CKTOTAL, CKMB, CKMBINDEX, TROPONINI in the last 168 hours.  BNP (last 3 results) No results for input(s): BNP in the last 8760 hours.  ProBNP (last 3 results) No results for input(s): PROBNP in the last 8760 hours.  CBG: No results for input(s): GLUCAP in the last 168 hours.  Radiological Exams on Admission: CT ABDOMEN PELVIS WO CONTRAST  Result Date: 03/07/2021 CLINICAL DATA:  Nonlocalized abdominal pain.  Zofran  with no relief. EXAM: CT ABDOMEN AND PELVIS WITHOUT CONTRAST TECHNIQUE: Multidetector CT imaging of the abdomen and pelvis was performed following the standard protocol without IV contrast. COMPARISON:  None. FINDINGS: Lower chest: Bilateral lower lobe subsegmental atelectasis. Partially visualized moderate volume hiatal hernia. Hepatobiliary: No focal liver abnormality. No gallstones,  gallbladder wall thickening, or pericholecystic fluid. No biliary dilatation. Pancreas: Diffusely atrophic. No focal lesion. Otherwise normal pancreatic contour. No surrounding inflammatory changes. No main pancreatic ductal dilatation. Spleen: Normal in size without focal abnormality. Adrenals/Urinary Tract: No adrenal nodule bilaterally. A 1.1 cm right nephrolithiasis. No hydronephrosis. Renal cortical scarring of the right kidney. No contour-deforming renal mass. No ureterolithiasis or hydroureter. The urinary bladder is unremarkable. Stomach/Bowel: Gaseous distension of the stomach contained in the abdomen. No evidence of bowel wall thickening or dilatation. No pneumatosis. Likely endoluminal clip within the cecum (5:39). The appendix not definitely identified. Vascular/Lymphatic: No abdominal aorta or iliac aneurysm. Mild atherosclerotic plaque of the aorta and its branches. No abdominal, pelvic, or inguinal lymphadenopathy. Reproductive: Uterus and bilateral adnexa are unremarkable. Other: No intraperitoneal free fluid. No intraperitoneal free gas. No organized fluid collection. Musculoskeletal: No abdominal wall hernia or abnormality. No suspicious lytic or blastic osseous lesions. No acute displaced fracture. Multilevel degenerative changes of the spine. Grade 1 anterolisthesis of L5 on S1. IMPRESSION: 1. Partially visualized moderate volume hiatal hernia. Gaseous distension of the portion of the stomach that is contained within the abdomen. Limited evaluation for ischemia on this noncontrast study. 2. Nonobstructive 1.1 cm  right nephrolithiasis. Electronically Signed   By: Iven Finn M.D.   On: 03/07/2021 03:58    EKG: I independently viewed the EKG done and my findings are as followed: Sinus tachycardia at a rate of 115 bpm with prolonged QTc (508 ms)  Assessment/Plan Present on Admission: . GI bleed . Hypothyroid . Stroke ([place]) . CAD (coronary artery disease) . Hyperlipidemia . HTN (hypertension)  Principal Problem:   GI bleed Active Problems:   Stroke ([place])   Hypothyroid   Hyperlipidemia   CAD (coronary artery disease)   HTN (hypertension)   Nausea & vomiting   Diarrhea   Abdominal pain   Leukocytosis   Hyperglycemia   Prolonged QT interval  GI bleed H/H= 12.6/43.1, this was 7.9/21.2 on 10/20/2019 Hemoccult was positive Continue IV Protonix drip Continue IV octreotide drip (due to upper GI bleed) Gastroenterologist  will be consulted to see patient in the morning, we will await further recommendation  Nausea, vomiting and abdominal pain An episode of hematemesis in the ED was reported, continue treatment as described above Abdominal pain may be due to GERD continue IV Compazine 10 mg every 6 hours as needed Continue IV NS at 75 mLs/Hr Continue IV Protonix drip Patient is currently n.p.o. Gastroenterologist  will be consulted to see patient in the morning  Acute diarrhea  C.diff and GI stool panel ordered in the ED  Prolonged QTc (508 ms) Avoid QT prolonging drugs Magnesium level will be checked  Leukocytosis possibly reactive  WBC 21.8, patient denies fever or chills Procalcitonin will be checked to rule out possible bacteremia  Hyperglycemia possibly reactive Blood glucose 169, she has no history of T2DM Consider checking hemoglobin A1c if blood glucose continues to stay elevated  History of stroke/ CAD  Continue home meds when patient resumes oral intake  Hypothyroidism/Essential hypertension/Hyperlipidemia Continue home meds when patient resumes oral  intake   DVT prophylaxis: SCDs  Code Status: Full code  Family Communication: None at bedside  Disposition Plan:  Patient is from:                        SNF Anticipated DC to:                   SNF  Anticipated DC date:               2-3 days Anticipated DC barriers:           Patient requires inpatient management due to GI bleed, vomiting and diarrhea pending further work-up and GI consult   Consults called: Gastroenterology  Admission status: Inpatient    Bernadette Hoit MD [facility]  03/07/2021, 7:25 AM

## 2021-03-07 NOTE — ED Notes (Signed)
Assist pt while vomiting and clean pt

## 2021-03-07 NOTE — ED Notes (Addendum)
Pt arrived to ED with brief full of urine and stool. Pt was cleaned, new brief applied. Pt repositioned in bed for comfort and placed on purewick.   Stool sample collected and left at bedside.

## 2021-03-07 NOTE — Progress Notes (Signed)
   03/07/21 1344  Vitals  Temp 99.5 F (37.5 C)  Temp Source Oral  BP (!) 158/72  MAP (mmHg) 98  BP Location Right Leg  BP Method Automatic  Pulse Rate (!) 128  Pulse Rate Source Monitor  Resp 18  Level of Consciousness  Level of Consciousness Alert  MEWS COLOR  MEWS Score Color Yellow  Oxygen Therapy  SpO2 90 %  O2 Device Room Air  MEWS Score  MEWS Temp 0  MEWS Systolic 0  MEWS Pulse 2  MEWS RR 0  MEWS LOC 0  MEWS Score 2  Provider Notification  Provider Name/Title Irwin Brakeman MD  Date Provider Notified 03/07/21  Time Provider Notified 1347  Notification Type Page  Notification Reason Other (Comment) (Yellow MEWS)  Provider response See new orders  Date of Provider Response 03/07/21  Time of Provider Response 1430

## 2021-03-07 NOTE — ED Provider Notes (Addendum)
[facility] Provider Note   CSN: [id number] Arrival date & time: 03/07/21  0055     History Chief Complaint  Patient presents with  . Emesis    Karen Mccann is a 70 y.o. female.  Patient brought to the emergency department from nursing home for evaluation of nausea, vomiting, diarrhea.  Symptoms began around noon today.  She has had persistent vomiting despite being administered Imodium and Zofran.  Patient reports burning pain in the upper abdomen.  She has not had a fever.        Past Medical History:  Diagnosis Date  . Anxiety   . Atherosclerotic heart disease of native coronary artery without angina pectoris   . Bipolar 1 disorder, depressed ([place])   . Chronic pain syndrome   . COPD (chronic obstructive pulmonary disease) ([place])   . COVID-19   . Depression   . Essential (primary) hypertension   . Gastroesophageal reflux   . GERD without esophagitis   . Heart failure ([place])   . Heart failure, unspecified ([place])   . Hemiparesis ([place])   . Hemiplegia ([place])   . Herpesviral infection, unspecified   . Hyperlipidemia   . Hypothyroidism   . Iron deficiency anemia   . Long term (current) use of anticoagulants   . Major depressive disorder   . Opioid dependence, uncomplicated ([place])   . Other disorders of plasma-protein metabolism, not elsewhere classified   . Paraplegia, unspecified ([place])   . Personal history of other diseases of the circulatory system   . Phonological disorder   . Presence of aortocoronary bypass graft   . Seizures ([place])   . Stroke ([place])   . Unspecified convulsions ([place])   . Vitamin D deficiency     Patient Active Problem List   Diagnosis Date Noted  . Acute on chronic anemia 10/19/2019  . COVID-19 virus infection 10/19/2019  . Hypokalemia 10/19/2019  . Status epilepticus ([place]) 02/08/2019  . Anemia 05/12/2018  . Sepsis ([place]) 03/23/2016  . 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 Lower urinary tract infectious disease  09/17/2015  . Stroke ([place]) 05/09/2015  . Hypothyroid 05/09/2015  . Bipolar 1 disorder ([place]) 05/09/2015  . Seizures ([place]) 05/09/2015  . Hyperlipidemia 05/09/2015  . COPD (chronic obstructive pulmonary disease) ([place]) 05/09/2015  . Depression 05/09/2015  . Anxiety 05/09/2015  . General weakness 05/09/2015  . CAD (coronary artery disease) 05/09/2015  . 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 HTN (hypertension) 05/09/2015  . Chronic pain 05/09/2015  . Esophageal reflux disease 05/09/2015  . Other heart disorders in diseases classified elsewhere 05/09/2015  . Paraplegia ([place]) 05/09/2015    Past Surgical History:  Procedure Laterality Date  . CARDIAC SURGERY    . COLONOSCOPY WITH PROPOFOL N/A 05/14/2018   Procedure: COLONOSCOPY WITH PROPOFOL;  Surgeon: Rogene Houston, MD;  Location: [facility];  Service: Endoscopy;  Laterality: N/A;  . ESOPHAGOGASTRODUODENOSCOPY (EGD) WITH PROPOFOL N/A 05/14/2018   Procedure: ESOPHAGOGASTRODUODENOSCOPY (EGD) WITH PROPOFOL;  Surgeon: Rogene Houston, MD;  Location: [facility];  Service: Endoscopy;  Laterality: N/A;  . POLYPECTOMY  05/14/2018   Procedure: POLYPECTOMY;  Surgeon: Rogene Houston, MD;  Location: [facility];  Service: Endoscopy;;  cecal polyp  . THYROID SURGERY     70 yrs old     OB History   No obstetric history on file.     Family History  Problem Relation Age of Onset  . Hypertension Other     Social History  Tobacco Use  . Smoking status: Former Smoker    Packs/day: 0.50    Types: Cigarettes    Quit date: 05/16/2003    Years since quitting: 17.8  . Smokeless tobacco: Never Used  Vaping Use  . Vaping Use: Never used  Substance Use Topics  . Alcohol use: No    Alcohol/week: 0.0 standard drinks  . Drug use: Never    Home Medications Prior to Admission medications   Medication Sig Start Date End Date Taking? Authorizing Provider  acetaminophen (TYLENOL) 325 MG tablet Take 650 mg by mouth every 8 (eight) hours as needed.     [provider]  apixaban (ELIQUIS) 2.5 MG TABS tablet Take 1 tablet (2.5 mg total) by mouth 2 (two) times daily for 30 days. 02/09/19 03/11/19  Manuella Ghazi, Pratik D, DO  ascorbic acid (VITAMIN C) 500 MG tablet Take 500 mg by mouth 2 (two) times daily.    [provider]  DULoxetine (CYMBALTA) 30 MG capsule Take 30 mg by mouth daily.     [provider]  escitalopram (LEXAPRO) 20 MG tablet Take 20 mg by mouth daily.    [provider]  fluticasone (FLONASE) 50 MCG/ACT nasal spray Place 1 spray into both nostrils at bedtime.    [provider]  furosemide (LASIX) 20 MG tablet Take 20 mg by mouth daily.     [provider]  hydrOXYzine (ATARAX/VISTARIL) 25 MG tablet Take 25 mg by mouth every 6 (six) hours as needed for itching.     [provider]  ipratropium (ATROVENT) 0.03 % nasal spray Place 2 sprays into both nostrils every 6 (six) hours as needed for rhinitis.    [provider]  iron polysaccharides (NIFEREX) 150 MG capsule Take 1 capsule (150 mg total) by mouth 2 (two) times daily. 10/20/19   Barton Dubois, MD  isosorbide dinitrate (ISORDIL) 30 MG tablet Take 30 mg by mouth daily.    [provider]  levETIRAcetam (KEPPRA) 500 MG tablet Take 1 tablet (500 mg total) by mouth 2 (two) times daily for 30 days. 02/09/19 03/11/19  Manuella Ghazi, Pratik D, DO  levothyroxine (SYNTHROID, LEVOTHROID) 200 MCG tablet Take 200 mcg by mouth daily before breakfast.    [provider]  LORazepam (ATIVAN) 0.5 MG tablet Take 0.5 mg by mouth at bedtime.    [provider]  Melatonin 3 MG TABS Take 3 mg by mouth at bedtime.     [provider]  metoprolol tartrate (LOPRESSOR) 25 MG tablet Take 1 tablet (25 mg total) by mouth 2 (two) times daily. Patient taking differently: Take 25 mg by mouth daily.  05/16/15   Arnoldo Lenis, MD  nitroGLYCERIN (NITROSTAT) 0.4 MG SL tablet Place 0.4 mg under the tongue every 5 (five) minutes  as needed for chest pain.    [provider]  ondansetron (ZOFRAN) 4 MG tablet Take 4 mg by mouth every 8 (eight) hours as needed for nausea or vomiting.     [provider]  oxyCODONE-acetaminophen (PERCOCET/ROXICET) 5-325 MG tablet Take 1 tablet by mouth every 12 (twelve) hours as needed for moderate pain or severe pain.     [provider]  polyethylene glycol powder (GLYCOLAX/MIRALAX) powder Take 17 g by mouth daily.    [provider]  pravastatin (PRAVACHOL) 80 MG tablet Take 80 mg by mouth daily.    [provider]  pregabalin (LYRICA) 75 MG capsule Take 75 mg by mouth 2 (two) times daily.  [provider]  senna (SENOKOT) 8.6 MG TABS tablet Take 1 tablet by mouth 2 (two) times daily.    [provider]  vitamin B-12 (CYANOCOBALAMIN) 1000 MCG tablet Take 1,000 mcg by mouth daily.    [provider]    Allergies    Formaldehyde and Latex  Review of Systems   Review of Systems  Constitutional: Negative for fever.  Respiratory: Negative for shortness of breath.   Cardiovascular: Negative for chest pain.  Gastrointestinal: Positive for abdominal pain, diarrhea, nausea and vomiting.  All other systems reviewed and are negative.   Physical Exam Updated Vital Signs BP (!) 154/104 (BP Location: Right Arm)   Pulse (!) 107   Temp 98.4 F (36.9 C)   Resp 19   Ht 5\' 6"  (1.676 m)   Wt 114 kg   SpO2 94%   BMI 40.56 kg/m   Physical Exam Vitals and nursing note reviewed.  Constitutional:      General: She is not in acute distress.    Appearance: Normal appearance. She is well-developed.  HENT:     Head: Normocephalic and atraumatic.     Right Ear: Hearing normal.     Left Ear: Hearing normal.     Nose: Nose normal.  Eyes:     Conjunctiva/sclera: Conjunctivae normal.     Pupils: Pupils are equal, round, and reactive to light.  Cardiovascular:     Rate and Rhythm: Regular rhythm.     Heart sounds: S1  normal and S2 normal. No murmur heard. No friction rub. No gallop.   Pulmonary:     Effort: Pulmonary effort is normal. No respiratory distress.     Breath sounds: Normal breath sounds.  Chest:     Chest wall: No tenderness.  Abdominal:     General: Bowel sounds are normal.     Palpations: Abdomen is soft.     Tenderness: There is abdominal tenderness in the epigastric area. There is no guarding or rebound. Negative signs include Murphy's sign and McBurney's sign.     Hernia: No hernia is present.  Musculoskeletal:        General: Normal range of motion.     Cervical back: Normal range of motion and neck supple.  Skin:    General: Skin is warm and dry.     Findings: No rash.  Neurological:     Mental Status: She is alert and oriented to person, place, and time.     GCS: GCS eye subscore is 4. GCS verbal subscore is 5. GCS motor subscore is 6.     Cranial Nerves: No cranial nerve deficit.     Sensory: No sensory deficit.     Coordination: Coordination normal.  Psychiatric:        Speech: Speech normal.        Behavior: Behavior normal.        Thought Content: Thought content normal.     ED Results / Procedures / Treatments   Labs (all labs ordered are listed, but only abnormal results are displayed) Labs Reviewed  CBC WITH DIFFERENTIAL/PLATELET - Abnormal; Notable for the following components:      Result Value   WBC 21.8 (*)    MCH 25.4 (*)    MCHC 29.2 (*)    Neutro Abs 19.6 (*)    Abs Immature Granulocytes 0.12 (*)    All other components within normal limits  COMPREHENSIVE METABOLIC PANEL - Abnormal; Notable for the following components:   Glucose, Bld  169 (*)    Calcium 8.7 (*)    Albumin 3.2 (*)    All other components within normal limits  C DIFFICILE QUICK SCREEN W PCR REFLEX  GASTROINTESTINAL PANEL BY PCR, STOOL (REPLACES STOOL CULTURE)  RESP PANEL BY RT-PCR (FLU A&B, COVID) ARPGX2  LIPASE, BLOOD  OCCULT BLOOD GASTRIC / DUODENUM (SPECIMEN CUP)  POC  OCCULT BLOOD, ED  TYPE AND SCREEN    EKG EKG Interpretation  Date/Time:  Wednesday Mar 07 2021 01:06:51 EDT Ventricular Rate:  115 PR Interval:  144 QRS Duration: 98 QT Interval:  367 QTC Calculation: 508 R Axis:   57 Text Interpretation: Sinus tachycardia Inferior infarct, age indeterminate Lateral leads are also involved Prolonged QT interval No significant change since last tracing Confirmed by Orpah Greek [phone]) on 03/07/2021 4:00:01 AM   Radiology CT ABDOMEN PELVIS WO CONTRAST  Result Date: 03/07/2021 CLINICAL DATA:  Nonlocalized abdominal pain.  Zofran with no relief. EXAM: CT ABDOMEN AND PELVIS WITHOUT CONTRAST TECHNIQUE: Multidetector CT imaging of the abdomen and pelvis was performed following the standard protocol without IV contrast. COMPARISON:  None. FINDINGS: Lower chest: Bilateral lower lobe subsegmental atelectasis. Partially visualized moderate volume hiatal hernia. Hepatobiliary: No focal liver abnormality. No gallstones, gallbladder wall thickening, or pericholecystic fluid. No biliary dilatation. Pancreas: Diffusely atrophic. No focal lesion. Otherwise normal pancreatic contour. No surrounding inflammatory changes. No main pancreatic ductal dilatation. Spleen: Normal in size without focal abnormality. Adrenals/Urinary Tract: No adrenal nodule bilaterally. A 1.1 cm right nephrolithiasis. No hydronephrosis. Renal cortical scarring of the right kidney. No contour-deforming renal mass. No ureterolithiasis or hydroureter. The urinary bladder is unremarkable. Stomach/Bowel: Gaseous distension of the stomach contained in the abdomen. No evidence of bowel wall thickening or dilatation. No pneumatosis. Likely endoluminal clip within the cecum (5:39). The appendix not definitely identified. Vascular/Lymphatic: No abdominal aorta or iliac aneurysm. Mild atherosclerotic plaque of the aorta and its branches. No abdominal, pelvic, or inguinal lymphadenopathy. Reproductive: Uterus  and bilateral adnexa are unremarkable. Other: No intraperitoneal free fluid. No intraperitoneal free gas. No organized fluid collection. Musculoskeletal: No abdominal wall hernia or abnormality. No suspicious lytic or blastic osseous lesions. No acute displaced fracture. Multilevel degenerative changes of the spine. Grade 1 anterolisthesis of L5 on S1. IMPRESSION: 1. Partially visualized moderate volume hiatal hernia. Gaseous distension of the portion of the stomach that is contained within the abdomen. Limited evaluation for ischemia on this noncontrast study. 2. Nonobstructive 1.1 cm right nephrolithiasis. Electronically Signed   By: Iven Finn M.D.   On: 03/07/2021 03:58    Procedures Procedures   Medications Ordered in ED Medications  pantoprazole (PROTONIX) 80 mg in sodium chloride 0.9 % 100 mL IVPB (has no administration in time range)  pantoprazole (PROTONIX) 80 mg in sodium chloride 0.9 % 100 mL (0.8 mg/mL) infusion (has no administration in time range)  pantoprazole (PROTONIX) injection 40 mg (has no administration in time range)  sodium chloride 0.9 % bolus 500 mL (0 mLs Intravenous Stopped 03/07/21 0315)  ondansetron (ZOFRAN) injection 4 mg (4 mg Intravenous Given 03/07/21 0311)  sodium chloride 0.9 % bolus 500 mL (500 mLs Intravenous New Bag/Given 03/07/21 0504)  metoCLOPramide (REGLAN) injection 5 mg (5 mg Intravenous Given 03/07/21 0505)    ED Course  I have reviewed the triage vital signs and the nursing notes.  Pertinent labs & imaging results that were available during my care of the patient were reviewed by me and considered in my medical decision making (see chart for details).  MDM Rules/Calculators/A&P                          Patient has been experiencing nausea, vomiting and diarrhea throughout the day.  Patient was given symptomatic treatment at the nursing home but continued and therefore was sent to the ER.  Patient complaining of epigastric pain pain at  arrival.  She had mild tenderness without guarding or rebound.  Patient has had persistent nausea and vomiting and fairly voluminous diarrhea output here in the department.  Vomitus is very dark, almost black.  Stool is dark brown but has some mucus and blood mixed with it.  Occult positive.  C. difficile, GI pathogen panel sent to the lab.  She is noted to have some cough as well.  COVID swab pending.  Patient has had persistent mild tachycardia.  She has not anemic or hypotensive at this point, but has continuous output and therefore will require hospitalization.  We will initiate Protonix IV bolus and drip.  Addendum, discussed with Dr. Josephine Cables, request octreotide which will be ordered.  CRITICAL CARE Performed by: Orpah Greek   Total critical care time: 30 minutes  Critical care time was exclusive of separately billable procedures and treating other patients.  Critical care was necessary to treat or prevent imminent or life-threatening deterioration.  Critical care was time spent personally by me on the following activities: development of treatment plan with patient and/or surrogate as well as nursing, discussions with consultants, evaluation of patient's response to treatment, examination of patient, obtaining history from patient or surrogate, ordering and performing treatments and interventions, ordering and review of laboratory studies, ordering and review of radiographic studies, pulse oximetry and re-evaluation of patient's condition.   Final Clinical Impression(s) / ED Diagnoses Final diagnoses:  Nausea vomiting and diarrhea  Gastrointestinal hemorrhage, unspecified gastrointestinal hemorrhage type    Rx / DC Orders ED Discharge Orders    None       Demani Weyrauch, Gwenyth Allegra, MD 03/07/21 6004    Orpah Greek, MD 03/07/21 [phone]

## 2021-03-07 NOTE — ED Triage Notes (Signed)
Pt c/o n/v/d since 1200. Nursing facility gave her imodium ad and zofran at 2000 with no relief.

## 2021-03-07 NOTE — Consult Note (Signed)
Referring Provider: Bernadette Hoit, DO Primary Care Physician:  Caprice Renshaw, MD Primary Gastroenterologist:  Dr. Hildred Laser  Reason for Consultation:  GI bleed  HPI: Karen Mccann is a 70 y.o. female with past medical history significant for multiple strokes (remote) on Eliquis, paraplegia/nonambulatory since major stroke 18 years ago per patient, seizures, IDA, heart failure, hypertension, COPD, CAD status post CABG, bipolar disorder presented from nursing facility with acute onset vomiting and diarrhea, generalized abdominal pain with burning into chest.   Patient reports numerous residents with diarrhea. She wonders if she has food poisoning. At baseline has IBS, lately more constipation. Started on Miralax couple of weeks ago and going a little too frequent now. States yesterday around noon started having N/V/diarrhea, abdominal pain. She reports having more than 10 stools. She did not hear of any blood in stool or emesis (she is incontinent and wears depends) prior to coming to the ED. Last episode of vomiting and diarrhea prior to coming onto the floor from the ED. She states at baseline her heartburn is poorly controlled. Used to be on Nexium but states she can no longer get at nursing home. She is not sure what she takes for reflux but says it does not work. I don't see anything for reflux on her med list. No dysphagia.    In the emergency department, she was tachycardic, intermittently tachypneic.  Blood pressure 157/103.  Temperature 98.4.  O2 sats 96% on room air.  Labs significant for leukocytosis with white blood cell count of 21,800.  Influenza A, B, SARS coronavirus 2 were all negative.  CT abdomen pelvis without contrast showed partially visualized moderate volume hiatal hernia, gaseous distention of the stomach, likely endoluminal clip within the cecum, and nonobstructive 1.1 cm right nephrolithiasis.  She had diffusely atrophic pancreas. No evidence of cirrhosis. She had  persistent nausea and vomiting, fairly voluminous diarrhea.  Vomitus almost black, Gastroccult positive.  Stool dark brown with some mucus and mixed with some blood. C. difficile negative.  Patient was started on pantoprazole drip, octreotide drip.   Colonoscopy August 2019: -Decreased sphincter tone found on digital rectal exam. -One 12 mm polyp in the cecum, removed with a hot snare. Resected and retrieved. Clips (MR conditional) were placed. -Biopsy showed tubular adenoma without dysplasia.  Next colonoscopy in 5 years.  EGD August 2019: -Normal esophagus. -Z-line regular, 30 cm from the incisors. -9 cm hiatal hernia. -Non-bleeding long linear erosions at the level of hiatus. -Normal duodenal bulb, second portion of the duodenum and third portion of the duodenum. -No specimens collected.  Patient has history of IDA, in July 2019 presented with hemoglobin of 5.2.  Work-up including EGD and colonoscopy.  Celiac serologies were negative.  Review of her records reveals hemoglobin of 5.6 again in January 2021.  Found incidentally on outpatient labs.  She was admitted and received 2 units of packed red blood cells at that time.  COVID-positive at that time.  GI unofficially consulted and recommended celiac serologies and arrange for outpatient capsule endoscopy.  This was never completed.    Prior to Admission medications   Medication Sig Start Date End Date Taking? Authorizing Provider  acetaminophen (TYLENOL) 325 MG tablet Take 650 mg by mouth every 8 (eight) hours as needed.    [provider]  apixaban (ELIQUIS) 2.5 MG TABS tablet Take 1 tablet (2.5 mg total) by mouth 2 (two) times daily for 30 days. 02/09/19 03/11/19  Manuella Ghazi, Pratik D, DO  ascorbic acid (VITAMIN C)  500 MG tablet Take 500 mg by mouth 2 (two) times daily.    [provider]  DULoxetine (CYMBALTA) 30 MG capsule Take 30 mg by mouth daily.     [provider]  escitalopram (LEXAPRO) 20 MG tablet Take 20  mg by mouth daily.    [provider]  fluticasone (FLONASE) 50 MCG/ACT nasal spray Place 1 spray into both nostrils at bedtime.    [provider]  furosemide (LASIX) 20 MG tablet Take 20 mg by mouth daily.     [provider]  hydrOXYzine (ATARAX/VISTARIL) 25 MG tablet Take 25 mg by mouth every 6 (six) hours as needed for itching.     [provider]  ipratropium (ATROVENT) 0.03 % nasal spray Place 2 sprays into both nostrils every 6 (six) hours as needed for rhinitis.    [provider]  iron polysaccharides (NIFEREX) 150 MG capsule Take 1 capsule (150 mg total) by mouth 2 (two) times daily. 10/20/19   Barton Dubois, MD  isosorbide dinitrate (ISORDIL) 30 MG tablet Take 30 mg by mouth daily.    [provider]  levETIRAcetam (KEPPRA) 500 MG tablet Take 1 tablet (500 mg total) by mouth 2 (two) times daily for 30 days. 02/09/19 03/11/19  Manuella Ghazi, Pratik D, DO  levothyroxine (SYNTHROID, LEVOTHROID) 200 MCG tablet Take 200 mcg by mouth daily before breakfast.    [provider]  LORazepam (ATIVAN) 0.5 MG tablet Take 0.5 mg by mouth at bedtime.    [provider]  Melatonin 3 MG TABS Take 3 mg by mouth at bedtime.     [provider]  metoprolol tartrate (LOPRESSOR) 25 MG tablet Take 1 tablet (25 mg total) by mouth 2 (two) times daily. Patient taking differently: Take 25 mg by mouth daily.  05/16/15   Arnoldo Lenis, MD  nitroGLYCERIN (NITROSTAT) 0.4 MG SL tablet Place 0.4 mg under the tongue every 5 (five) minutes as needed for chest pain.    [provider]  ondansetron (ZOFRAN) 4 MG tablet Take 4 mg by mouth every 8 (eight) hours as needed for nausea or vomiting.     [provider]  oxyCODONE-acetaminophen (PERCOCET/ROXICET) 5-325 MG tablet Take 1 tablet by mouth every 12 (twelve) hours as needed for moderate pain or severe pain.     [provider]  polyethylene glycol powder (GLYCOLAX/MIRALAX)  powder Take 17 g by mouth daily.    [provider]  pravastatin (PRAVACHOL) 80 MG tablet Take 80 mg by mouth daily.    [provider]  pregabalin (LYRICA) 75 MG capsule Take 75 mg by mouth 2 (two) times daily.     [provider]  senna (SENOKOT) 8.6 MG TABS tablet Take 1 tablet by mouth 2 (two) times daily.    [provider]  vitamin B-12 (CYANOCOBALAMIN) 1000 MCG tablet Take 1,000 mcg by mouth daily.    [provider]    Current Facility-Administered Medications  Medication Dose Route Frequency Provider Last Rate Last Admin  . octreotide (SANDOSTATIN) 500 mcg in sodium chloride 0.9 % 250 mL (2 mcg/mL) infusion  50 mcg/hr Intravenous Continuous Adefeso, Oladapo, DO 25 mL/hr at 03/07/21 0705 50 mcg/hr at 03/07/21 0705  . pantoprazole (PROTONIX) 80 mg in sodium chloride 0.9 % 100 mL (0.8 mg/mL) infusion  8 mg/hr Intravenous Continuous Adefeso, Oladapo, DO 10 mL/hr at 03/07/21 0700 8 mg/hr at 03/07/21 0700  . [START ON 03/10/2021] pantoprazole (PROTONIX) injection 40 mg  40 mg Intravenous  Q12H Bernadette Hoit, DO      . prochlorperazine (COMPAZINE) injection 10 mg  10 mg Intravenous Q6H PRN Adefeso, Oladapo, DO        Allergies as of 03/07/2021 - Review Complete 03/07/2021  Allergen Reaction Noted  . Formaldehyde  10/19/2019  . Latex Other (See Comments) 05/09/2015    Past Medical History:  Diagnosis Date  . Anxiety   . Atherosclerotic heart disease of native coronary artery without angina pectoris   . Bipolar 1 disorder, depressed ([place])   . Chronic pain syndrome   . COPD (chronic obstructive pulmonary disease) ([place])   . COVID-19   . Depression   . Essential (primary) hypertension   . Gastroesophageal reflux   . GERD without esophagitis   . Heart failure ([place])   . Heart failure, unspecified ([place])   . Hemiparesis ([place])   . Hemiplegia ([place])   . Herpesviral infection, unspecified   . Hyperlipidemia   . Hypothyroidism   . Iron  deficiency anemia   . Long term (current) use of anticoagulants   . Major depressive disorder   . Opioid dependence, uncomplicated ([place])   . Other disorders of plasma-protein metabolism, not elsewhere classified   . Paraplegia, unspecified ([place])   . Personal history of other diseases of the circulatory system   . Phonological disorder   . Presence of aortocoronary bypass graft   . Seizures ([place])   . Stroke ([place])   . Unspecified convulsions ([place])   . Vitamin D deficiency     Past Surgical History:  Procedure Laterality Date  . CARDIAC SURGERY     cabg  . COLONOSCOPY WITH PROPOFOL N/A 05/14/2018   Procedure: COLONOSCOPY WITH PROPOFOL;  Surgeon: Rogene Houston, MD;  [facility];  Service: Endoscopy;  Laterality: N/A;  . ESOPHAGOGASTRODUODENOSCOPY (EGD) WITH PROPOFOL N/A 05/14/2018   Procedure: ESOPHAGOGASTRODUODENOSCOPY (EGD) WITH PROPOFOL;  Surgeon: Rogene Houston, MD;  [facility];  Service: Endoscopy;  Laterality: N/A;  . POLYPECTOMY  05/14/2018   Procedure: POLYPECTOMY;  Surgeon: Rogene Houston, MD;  [facility];  Service: Endoscopy;;  cecal polyp  . THYROID SURGERY     70 yrs old    Family History  Problem Relation Age of Onset  . Hypertension Other   . Ovarian cancer Daughter   . Ovarian cancer Mother   . Breast cancer Mother   . Liver disease Neg Hx   . Colon cancer Neg Hx     Social History   Socioeconomic History  . Marital status: Divorced    Spouse name: Not on file  . Number of children: Not on file  . Years of education: Not on file  . Highest education level: Not on file  Occupational History  . Not on file  Tobacco Use  . Smoking status: Former Smoker    Packs/day: 0.50    Types: Cigarettes    Quit date: 05/16/2003    Years since quitting: 17.8  . Smokeless tobacco: Never Used  Vaping Use  . Vaping Use: Never used  Substance and Sexual Activity  . Alcohol use: No    Alcohol/week: 0.0 standard drinks  . Drug  use: Never  . Sexual activity: Never  Other Topics Concern  . Not on file  Social History Narrative  . Not on file   Social Determinants of Health   Financial Resource Strain: Not on file  Food Insecurity: Not on file  Transportation Needs: Not on file  Physical  Activity: Not on file  Stress: Not on file  Social Connections: Not on file  Intimate Partner Violence: Not on file     ROS:  General: Negative for anorexia, weight loss, fever, chills, fatigue,+ weakness. Eyes: Negative for vision changes.  ENT: Negative for hoarseness, difficulty swallowing , nasal congestion. CV: Negative for chest pain, angina, palpitations, dyspnea on exertion, peripheral edema. See hpi Respiratory: Negative for dyspnea at rest, dyspnea on exertion, cough, sputum, wheezing.  GI: See history of present illness. GU:  Negative for dysuria, hematuria, urinary incontinence, urinary frequency, nocturnal urination.  MS: Negative for joint pain, low back pain.  Derm: Negative for rash or itching.  Neuro: Negative for weakness, abnormal sensation, seizure, frequent headaches, memory loss, confusion.  Psych: Negative for anxiety, depression, suicidal ideation, hallucinations.  Endo: Negative for unusual weight change.  Heme: Negative for bruising or bleeding. Allergy: Negative for rash or hives.       Physical Examination: Vital signs in last 24 hours: Temp:  [98.4 F (36.9 C)-98.6 F (37 C)] 98.6 F (37 C) (05/25 0849) Pulse Rate:  [101-116] 111 (05/25 0849) Resp:  [16-24] 16 (05/25 0849) BP: (112-157)/(78-104) 153/79 (05/25 0849) SpO2:  [89 %-96 %] 91 % (05/25 0849) Weight:  [989 kg-117.9 kg] 117.9 kg (05/25 0837) Last BM Date: 03/07/21  General: morbidly obese, pleasant female in NAD. alert and cooperative. in no acute distress.  Head: Normocephalic, atraumatic.   Eyes: Conjunctiva pink, no icterus. Mouth: Oropharyngeal mucosa moist and pink , no lesions erythema or exudate. Neck: Supple  without thyromegaly, masses, or lymphadenopathy.  Lungs: Clear to auscultation bilaterally.  Heart: Regular rate and rhythm, no murmurs rubs or gallops.  Abdomen: Bowel sounds are normal, nontender, nondistended, no hepatosplenomegaly or masses, no abdominal bruits or    hernia , no rebound or guarding.  Exam limited by body habitus. Rectal: not performed Extremities: trace to 1+ lower extremity edema, clubbing, deformity.  Neuro: Alert and oriented x 4 ,  Skin: Warm and dry, no rash or jaundice.   Psych: Alert and cooperative, normal mood and affect.        Intake/Output from previous day: 05/24 0701 - 05/25 0700 In: -  Out: 300 [Emesis/NG output:300] Intake/Output this shift: Total I/O In: 100 [IV Piggyback:100] Out: -   Lab Results: CBC Recent Labs    03/07/21 0133  WBC 21.8*  HGB 12.6  HCT 43.1  MCV 86.7  PLT 309   BMET Recent Labs    03/07/21 0133  NA 140  K 3.6  CL 106  CO2 26  GLUCOSE 169*  BUN 10  CREATININE 0.76  CALCIUM 8.7*   LFT Recent Labs    03/07/21 0133  BILITOT 0.5  ALKPHOS 113  AST 15  ALT 11  PROT 7.4  ALBUMIN 3.2*    Lipase Recent Labs    03/07/21 0133  LIPASE 23    PT/INR No results for input(s): LABPROT, INR in the last 72 hours.    Imaging Studies: CT ABDOMEN PELVIS WO CONTRAST  Result Date: 03/07/2021 CLINICAL DATA:  Nonlocalized abdominal pain.  Zofran with no relief. EXAM: CT ABDOMEN AND PELVIS WITHOUT CONTRAST TECHNIQUE: Multidetector CT imaging of the abdomen and pelvis was performed following the standard protocol without IV contrast. COMPARISON:  None. FINDINGS: Lower chest: Bilateral lower lobe subsegmental atelectasis. Partially visualized moderate volume hiatal hernia. Hepatobiliary: No focal liver abnormality. No gallstones, gallbladder wall thickening, or pericholecystic fluid. No biliary dilatation. Pancreas: Diffusely atrophic. No focal lesion. Otherwise  normal pancreatic contour. No surrounding inflammatory  changes. No main pancreatic ductal dilatation. Spleen: Normal in size without focal abnormality. Adrenals/Urinary Tract: No adrenal nodule bilaterally. A 1.1 cm right nephrolithiasis. No hydronephrosis. Renal cortical scarring of the right kidney. No contour-deforming renal mass. No ureterolithiasis or hydroureter. The urinary bladder is unremarkable. Stomach/Bowel: Gaseous distension of the stomach contained in the abdomen. No evidence of bowel wall thickening or dilatation. No pneumatosis. Likely endoluminal clip within the cecum (5:39). The appendix not definitely identified. Vascular/Lymphatic: No abdominal aorta or iliac aneurysm. Mild atherosclerotic plaque of the aorta and its branches. No abdominal, pelvic, or inguinal lymphadenopathy. Reproductive: Uterus and bilateral adnexa are unremarkable. Other: No intraperitoneal free fluid. No intraperitoneal free gas. No organized fluid collection. Musculoskeletal: No abdominal wall hernia or abnormality. No suspicious lytic or blastic osseous lesions. No acute displaced fracture. Multilevel degenerative changes of the spine. Grade 1 anterolisthesis of L5 on S1. IMPRESSION: 1. Partially visualized moderate volume hiatal hernia. Gaseous distension of the portion of the stomach that is contained within the abdomen. Limited evaluation for ischemia on this noncontrast study. 2. Nonobstructive 1.1 cm right nephrolithiasis. Electronically Signed   By: Iven Finn M.D.   On: 03/07/2021 03:58  [4 week]   Impression: Pleasant 70 year old female with multiple comorbidities as outlined above, on Eliquis for history of strokes, presenting with acute onset vomiting, diarrhea.  GI consulted for GI bleeding, she was found to have dark emesis which was Gastroccult positive, brown stool mixed with blood.  GI bleed: Dark emesis, Gastroccult positive, brown loose stool mixed with some blood.  Symptoms in the setting of acute onset vomiting and diarrhea.  She is chronically  on Eliquis for history of strokes.  Hemoglobin on admission was 12.6.  Hemoglobin 13 now.  Suspect hematemesis related to Mallory-Weiss tear or possibly reflux esophagitis given poorly controlled heartburn symptoms.  She has no evidence of chronic liver disease with normal-appearing spleen and liver on CT.  Platelet counts normal.  Doubt we are dealing with esophageal variceal bleeding.  Patient empirically placed on octreotide by attending. EGD 2019 unremarkable except for few erosions, 9cm hiatal hernia. Colonoscopy 2019 single cecal tubular adenoma removed.   Acute onset vomiting/diarrhea: Possibly foodborne illness versus viral gastroenteritis.  C. difficile negative.  GI pathogen panel pending.  She reports several residents with her symptoms.  GERD: Poorly controlled.  Used to do well on Nexium but states she can no longer get at the nursing home.  Frequent heartburn symptoms.  She has a moderate sized hiatal hernia on CT imaging.  Last EGD in 2019 to evaluate IDA, found to have few erosions and 9 cm hiatal hernia.  Current medication list does not have PPI therapy on it or any medication specifically for reflux.  Prolonged QT interval: avoid QT prolonging drugs.  Plan: 1. Supportive measures with antiemetics and fluids.  2. Keep NPO for now due to ongoing vomiting.  3. F/u pending stool studies.  4. No indication for octreotide at this time. Will d/c. 5. Transition PPI to IV BID.  6. Monitor for further bleeding.   We would like to thank you for the opportunity to participate in the care of LORRAINA SPRING.  Laureen Ochs. Bernarda Caffey [facility] [phone] 5/25/202211:09 AM     LOS: 0 days

## 2021-03-07 NOTE — Progress Notes (Signed)
ASSUMPTION OF CARE NOTE   03/07/2021 2:52 PM  Karen Mccann was seen and examined.  The H&P by the admitting provider, orders, imaging was reviewed.  Please see new orders.  Will continue to follow.   Vitals:   03/07/21 1158 03/07/21 1344  BP: (!) 149/82 (!) 158/72  Pulse: (!) 120 (!) 128  Resp: 16 18  Temp:  99.5 F (37.5 C)  SpO2: 91% 90%    Results for orders placed or performed during the hospital encounter of 03/07/21  C Difficile Quick Screen w PCR reflex   Specimen: Stool  Result Value Ref [place]   C Diff antigen NEGATIVE NEGATIVE   C Diff toxin NEGATIVE NEGATIVE   C Diff interpretation No C. difficile detected.   Resp Panel by RT-PCR (Flu A&B, Covid)   Specimen: Nasopharyngeal(NP) swabs in vial transport medium  Result Value Ref [place]   SARS Coronavirus 2 by RT PCR NEGATIVE NEGATIVE   Influenza A by PCR NEGATIVE NEGATIVE   Influenza B by PCR NEGATIVE NEGATIVE  MRSA PCR Screening   Specimen: Nasopharyngeal  Result Value Ref [place]   MRSA by PCR NEGATIVE NEGATIVE  CBC with Differential/Platelet  Result Value Ref [place]   WBC 21.8 (H) 4.0 - 10.5 K/uL   RBC 4.97 3.87 - 5.11 MIL/uL   Hemoglobin 12.6 12.0 - 15.0 g/dL   HCT 43.1 36.0 - 46.0 %   MCV 86.7 80.0 - 100.0 fL   MCH 25.4 (L) 26.0 - 34.0 pg   MCHC 29.2 (L) 30.0 - 36.0 g/dL   RDW 15.5 11.5 - 15.5 %   Platelets 309 150 - 400 K/uL   nRBC 0.0 0.0 - 0.2 %   Neutrophils Relative % 90 %   Neutro Abs 19.6 (H) 1.7 - 7.7 K/uL   Lymphocytes Relative 5 %   Lymphs Abs 1.1 0.7 - 4.0 K/uL   Monocytes Relative 4 %   Monocytes Absolute 0.9 0.1 - 1.0 K/uL   Eosinophils Relative 0 %   Eosinophils Absolute 0.1 0.0 - 0.5 K/uL   Basophils Relative 0 %   Basophils Absolute 0.1 0.0 - 0.1 K/uL   Immature Granulocytes 1 %   Abs Immature Granulocytes 0.12 (H) 0.00 - 0.07 K/uL  Comprehensive metabolic panel  Result Value Ref [place]   Sodium 140 135 - 145 mmol/L   Potassium 3.6 3.5 - 5.1 mmol/L   Chloride 106 98 - 111 mmol/L    CO2 26 22 - 32 mmol/L   Glucose, Bld 169 (H) 70 - 99 mg/dL   BUN 10 8 - 23 mg/dL   Creatinine, Ser 0.76 0.44 - 1.00 mg/dL   Calcium 8.7 (L) 8.9 - 10.3 mg/dL   Total Protein 7.4 6.5 - 8.1 g/dL   Albumin 3.2 (L) 3.5 - 5.0 g/dL   AST 15 15 - 41 U/L   ALT 11 0 - 44 U/L   Alkaline Phosphatase 113 38 - 126 U/L   Total Bilirubin 0.5 0.3 - 1.2 mg/dL   GFR, Estimated >60 >60 mL/min   Anion gap 8 5 - 15  Lipase, blood  Result Value Ref [place]   Lipase 23 11 - 51 U/L  Occult bld gastric/duodenum (cup to lab)  Result Value Ref [place]   pH, Gastric 2    Occult Blood, Gastric POSITIVE (A) NEGATIVE  Procalcitonin - Baseline  Result Value Ref [place]   Procalcitonin <0.10 ng/mL  Hemoglobin and hematocrit, blood  Result Value Ref [place]   Hemoglobin 13.0 12.0 -  15.0 g/dL   HCT 44.5 36.0 - 46.0 %  Type and screen  Result Value Ref [place]   ABO/RH(D)      O POS Performed at [facility], [address], [place], [place] [address]    Antibody Screen      POS Performed at [facility], [address], [place], [place] [address]    Sample Expiration      03/10/2021,2359 Performed at [facility], [address], [place], [place] [address]    Antibody Identification      ANTI E ANTI c Performed at [facility], [place] [address] [place], [place] [address]      C. Wynetta Emery, MD [facility]   03/07/2021 12:56 AM How to contact the [facility] Attending or Consulting provider [place] or covering provider during after hours [place], for this patient?  1. Check the care team in [facility] and look for a) attending/consulting [facility] provider listed and b) the [facility] team listed 2. Log into [email or web address] and use [place]'s universal password to access. If you do not have the password, please contact the hospital operator. 3. Locate the [facility] provider you are looking for under [facility] and page to a number that you can be directly reached. 4. If you still have difficulty reaching the provider, please  page the [facility] (Director on Call) for the Hospitalists listed on amion for assistance.

## 2021-03-07 NOTE — Plan of Care (Signed)

## 2021-03-08 ENCOUNTER — Telehealth: Payer: Self-pay | Admitting: Gastroenterology

## 2021-03-08 DIAGNOSIS — R197 Diarrhea, unspecified: Secondary | ICD-10-CM

## 2021-03-08 DIAGNOSIS — R112 Nausea with vomiting, unspecified: Secondary | ICD-10-CM

## 2021-03-08 DIAGNOSIS — R739 Hyperglycemia, unspecified: Secondary | ICD-10-CM | POA: Diagnosis not present

## 2021-03-08 DIAGNOSIS — K922 Gastrointestinal hemorrhage, unspecified: Secondary | ICD-10-CM | POA: Diagnosis not present

## 2021-03-08 LAB — CBC
HCT: 41.4 % (ref 36.0–46.0)
Hemoglobin: 12.1 g/dL (ref 12.0–15.0)
MCH: 25.5 pg — ABNORMAL LOW (ref 26.0–34.0)
MCHC: 29.2 g/dL — ABNORMAL LOW (ref 30.0–36.0)
MCV: 87.3 fL (ref 80.0–100.0)
Platelets: 331 10*3/uL (ref 150–400)
RBC: 4.74 MIL/uL (ref 3.87–5.11)
RDW: 15.8 % — ABNORMAL HIGH (ref 11.5–15.5)
WBC: 13.7 10*3/uL — ABNORMAL HIGH (ref 4.0–10.5)
nRBC: 0 % (ref 0.0–0.2)

## 2021-03-08 LAB — COMPREHENSIVE METABOLIC PANEL
ALT: 10 U/L (ref 0–44)
AST: 12 U/L — ABNORMAL LOW (ref 15–41)
Albumin: 3.1 g/dL — ABNORMAL LOW (ref 3.5–5.0)
Alkaline Phosphatase: 84 U/L (ref 38–126)
Anion gap: 7 (ref 5–15)
BUN: 15 mg/dL (ref 8–23)
CO2: 27 mmol/L (ref 22–32)
Calcium: 8.7 mg/dL — ABNORMAL LOW (ref 8.9–10.3)
Chloride: 109 mmol/L (ref 98–111)
Creatinine, Ser: 0.7 mg/dL (ref 0.44–1.00)
GFR, Estimated: >60 mL/min (ref >60)
Glucose, Bld: 144 mg/dL — ABNORMAL HIGH (ref 70–99)
Potassium: 4 mmol/L (ref 3.5–5.1)
Sodium: 143 mmol/L (ref 135–145)
Total Bilirubin: 0.6 mg/dL (ref 0.3–1.2)
Total Protein: 6.8 g/dL (ref 6.5–8.1)

## 2021-03-08 LAB — PHOSPHORUS: Phosphorus: 1.4 mg/dL — ABNORMAL LOW (ref 2.5–4.6)

## 2021-03-08 LAB — PROTIME-INR
INR: 1.2 (ref 0.8–1.2)
Prothrombin Time: 15 seconds (ref 11.4–15.2)

## 2021-03-08 LAB — APTT: aPTT: 30 seconds (ref 24–36)

## 2021-03-08 LAB — MAGNESIUM: Magnesium: 2 mg/dL (ref 1.7–2.4)

## 2021-03-08 MED ORDER — METOPROLOL TARTRATE 50 MG PO TABS
50.0000 mg | ORAL_TABLET | Freq: Two times a day (BID) | ORAL | Status: DC
  Administered 2021-03-08 – 2021-03-10 (×4): 50 mg via ORAL
  Filled 2021-03-08 (×4): qty 1

## 2021-03-08 MED ORDER — POTASSIUM PHOSPHATES 15 MMOLE/5ML IV SOLN
20.0000 mmol | Freq: Once | INTRAVENOUS | Status: AC
  Administered 2021-03-08: 20 mmol via INTRAVENOUS
  Filled 2021-03-08 (×2): qty 6.67

## 2021-03-08 MED ORDER — METOPROLOL TARTRATE 25 MG PO TABS
12.5000 mg | ORAL_TABLET | Freq: Once | ORAL | Status: AC
  Administered 2021-03-08: 12.5 mg via ORAL
  Filled 2021-03-08: qty 1

## 2021-03-08 NOTE — Telephone Encounter (Signed)
Please arrange for non-urgent hospital follow up GERD, prior h/o IDA.

## 2021-03-08 NOTE — Telephone Encounter (Signed)
Forwarded to [facility] to sch'd hospital f/u

## 2021-03-08 NOTE — Progress Notes (Signed)
Subjective:  No further vomiting. Feels some nausea. Does not want to advance her diet yet. Diarrhea improved.  No abdominal pain.  Objective: Vital signs in last 24 hours: Temp:  [98.1 F (36.7 C)-99.5 F (37.5 C)] 98.1 F (36.7 C) (05/26 0809) Pulse Rate:  [107-128] 107 (05/26 0809) Resp:  [16-18] 18 (05/26 0809) BP: (148-159)/(72-89) 148/77 (05/26 0809) SpO2:  [90 %-96 %] 93 % (05/26 0809) Last BM Date: 03/07/21 General:   Alert,  Well-developed, well-nourished, pleasant and cooperative in NAD Head:  Normocephalic and atraumatic. Eyes:  Sclera clear, no icterus.  Abdomen:  Soft, obese, nontender and nondistended.Normal bowel sounds, without guarding, and without rebound.   Extremities:  Without clubbing, deformity.  Trace bilateral lower extremity edema. Neurologic:  Alert and  oriented x4;  grossly normal neurologically. Skin:  Intact without significant lesions or rashes. Psych:  Alert and cooperative. Normal mood and affect.  Intake/Output from previous day: 05/25 0701 - 05/26 0700 In: 580 [P.O.:480; IV Piggyback:100] Out: 250 [Urine:250] Intake/Output this shift: Total I/O In: 720 [P.O.:720] Out: -   Lab Results: CBC Recent Labs    03/07/21 0133 03/07/21 0909 03/07/21 1430 03/08/21 0616  WBC 21.8*  --   --  13.7*  HGB 12.6 13.0 12.6 12.1  HCT 43.1 44.5 42.7 41.4  MCV 86.7  --   --  87.3  PLT 309  --   --  331   BMET Recent Labs    03/07/21 0133 03/08/21 0616  NA 140 143  K 3.6 4.0  CL 106 109  CO2 26 27  GLUCOSE 169* 144*  BUN 10 15  CREATININE 0.76 0.70  CALCIUM 8.7* 8.7*   LFTs Recent Labs    03/07/21 0133 03/08/21 0616  BILITOT 0.5 0.6  ALKPHOS 113 84  AST 15 12*  ALT 11 10  PROT 7.4 6.8  ALBUMIN 3.2* 3.1*   Recent Labs    03/07/21 0133  LIPASE 23   PT/INR Recent Labs    03/08/21 0616  LABPROT 15.0  INR 1.2      Imaging Studies: CT ABDOMEN PELVIS WO CONTRAST  Result Date: 03/07/2021 CLINICAL DATA:  Nonlocalized  abdominal pain.  Zofran with no relief. EXAM: CT ABDOMEN AND PELVIS WITHOUT CONTRAST TECHNIQUE: Multidetector CT imaging of the abdomen and pelvis was performed following the standard protocol without IV contrast. COMPARISON:  None. FINDINGS: Lower chest: Bilateral lower lobe subsegmental atelectasis. Partially visualized moderate volume hiatal hernia. Hepatobiliary: No focal liver abnormality. No gallstones, gallbladder wall thickening, or pericholecystic fluid. No biliary dilatation. Pancreas: Diffusely atrophic. No focal lesion. Otherwise normal pancreatic contour. No surrounding inflammatory changes. No main pancreatic ductal dilatation. Spleen: Normal in size without focal abnormality. Adrenals/Urinary Tract: No adrenal nodule bilaterally. A 1.1 cm right nephrolithiasis. No hydronephrosis. Renal cortical scarring of the right kidney. No contour-deforming renal mass. No ureterolithiasis or hydroureter. The urinary bladder is unremarkable. Stomach/Bowel: Gaseous distension of the stomach contained in the abdomen. No evidence of bowel wall thickening or dilatation. No pneumatosis. Likely endoluminal clip within the cecum (5:39). The appendix not definitely identified. Vascular/Lymphatic: No abdominal aorta or iliac aneurysm. Mild atherosclerotic plaque of the aorta and its branches. No abdominal, pelvic, or inguinal lymphadenopathy. Reproductive: Uterus and bilateral adnexa are unremarkable. Other: No intraperitoneal free fluid. No intraperitoneal free gas. No organized fluid collection. Musculoskeletal: No abdominal wall hernia or abnormality. No suspicious lytic or blastic osseous lesions. No acute displaced fracture. Multilevel degenerative changes of the spine. Grade 1 anterolisthesis of L5 on  S1. IMPRESSION: 1. Partially visualized moderate volume hiatal hernia. Gaseous distension of the portion of the stomach that is contained within the abdomen. Limited evaluation for ischemia on this noncontrast study. 2.  Nonobstructive 1.1 cm right nephrolithiasis. Electronically Signed   By: Iven Finn M.D.   On: 03/07/2021 03:58  [2 weeks]   Assessment: Pleasant 70 year old female with multiple comorbidities, on Eliquis for history of strokes, presenting with acute onset vomiting and diarrhea.  GI consulted for GI bleeding, she was found to have dark emesis which was Gastroccult positive, brown stool mixed with blood.  GI bleed: Hemoglobin has remained stable, minimal.  Suspect hematemesis related to Mallory-Weiss tear or possibly reflux esophagitis as she has poorly controlled reflux, cannot exclude Lysbeth Galas lesions given large hiatal hernia.  Eliquis has been held.  Last EGD 2019 for IDA, unremarkable except for a few erosions, 9 cm hiatal hernia.  Acute onset vomiting/diarrhea: Possible foodborne illness versus viral gastroenteritis.  C. difficile negative.  GI panel pending.  Clinically improved.  Several residents at the nursing home with similar symptoms per patient.  Patient states she is not ready to advance diet, remains somewhat nauseated but better.  GERD: Poorly controlled as an outpatient.  Used to be on Nexium but no longer can get at the nursing home due to expense per patient.  Frequently has heartburn symptoms.  I do not see any medication on her list specifically for reflux.  Last EGD in 2019 with few erosions and 9 cm hiatal hernia.  Current CT imaging with moderate sized hiatal hernia.  Patient needs to be on PPI therapy.  Prolonged QT interval: Avoid QT prolonging drugs.   Plan: 1. Continue clear liquid diet.  Will advance when patient is ready. 2. Follow-up pending stool study when available. 3. Transition pantoprazole to 40 mg daily as an outpatient. 4. Resume Eliquis in 48 hours. 5. No indication for endoscopy at this time. 6. Follow-up as an outpatient.  Laureen Ochs. Bernarda Caffey [facility] [phone] 5/26/202211:52 AM     LOS: 1 day

## 2021-03-08 NOTE — Progress Notes (Signed)
PROGRESS NOTE   Karen Mccann  JKK:[id number] DOB: 12-Nov-1950 DOA: 03/07/2021 PCP: Caprice Renshaw, MD   Chief Complaint  Patient presents with  . Emesis   Level of care: Med-Surg  Brief Admission History:  70 y.o. 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  Patient was treated at the nursing facility with Imodium and Zofran without relief.  She also complains of pain in the abdomen, pain was described as burning sensation with radiation to the chest.  EMS was activated and patient was taken to the ED for further evaluation and management.  Patient is bedbound and wheelchair-bound at baseline due to prior multiple ischemic strokes.  Assessment & Plan:   Principal Problem:   GI bleed Active Problems:   Stroke ([place])   Hypothyroid   Hyperlipidemia   CAD (coronary artery disease)   HTN (hypertension)   Nausea & vomiting   Diarrhea   Abdominal pain   Leukocytosis   Hyperglycemia   Prolonged QT interval   Nausea vomiting and diarrhea   Rectal bleeding  Acute gastroenteritis  Improving with supportive measures Slowly advancing diet per GI team   Suspected acute GI bleed / Mallory Weiss Tear Pt has very poorly controlled GERD Hg holding stable.  Hemoccult was positive Continue IV Protonix drip Gastroenterologist team consultation appreciated  Nausea, vomiting and abdominal pain - secondary to acute gastroenteritis  Improving with supportive measures  continue IV Compazine 10 mg every 6 hours as needed Continue IV NS  Continue IV Protonix drip Advance diet as tolerated   Prolonged QTc (508 ms) Avoid QT prolonging drugs Magnesium level WNL EKG in AM to follow up QTc  Hypophosphatemia  IV replacement  ordered 5/26  Leukocytosis, reactive  WBC 21.8, patient denies fever or chills Procalcitonin <0.10 WBC trending down with supportive measures  Hyperglycemia possibly reactive Blood glucose 169, she has no history of T2DM A1c pending   History of stroke/ CAD  Continue home meds when patient resumes oral intake Temporarily holding home apixaban, can restart per GI in 48 hours  Hypothyroidism/Essential hypertension/Hyperlipidemia Continue home meds   Sinus Tachycardia  Metoprolol increased to 50 mg BID    DVT prophylaxis: SCDs  Code Status: Full code  Family Communication: None at bedside  Disposition Plan:  Patient is from:SNF Anticipated DC to:SNF   DVT prophylaxis: SCDs Code Status: full  Family Communication: discussed plan of care with patient  Disposition: SNF  Status is: Inpatient  Remains inpatient appropriate because:IV treatments appropriate due to intensity of illness or inability to take PO and Inpatient level of care appropriate due to severity of illness   Dispo: The patient is from: Home              Anticipated d/c is to: Home              Patient currently is not medically stable to d/c.   Difficult to place patient No   Consultants:   GI   Procedures:     Antimicrobials:     Subjective: Pt reports feeling better and reports that she is not having any further bleeding.  Tolerating clear liquids so far.   Objective: Vitals:   03/07/21 1344 03/07/21 1734 03/07/21 2047 03/08/21 0809  BP: (!) 158/72 (!) 154/89 (!) 159/87 Marland Kitchen)  148/77  Pulse: (!) 128 (!) 111 (!) 110 (!) 107  Resp: 18 18 18 18   Temp: 99.5 F (37.5 C)  99.3 F (37.4 C) 98.1 F (36.7 C)  TempSrc: Oral  Oral Oral  SpO2: 90% 96% 96% 93%  Weight:      Height:        Intake/Output Summary (Last 24 hours) at 03/08/2021 1335 Last data filed at 03/08/2021 0900 Gross per 24 hour  Intake 1200 ml  Output 250 ml  Net 950 ml    Filed Weights   03/07/21 0058 03/07/21 0837  Weight: 114 kg 117.9 kg    Examination:  General exam: Appears calm and comfortable  Respiratory system: Clear to auscultation. Respiratory effort normal. Cardiovascular system: normal S1 & S2 heard. No JVD, murmurs, rubs, gallops or clicks. No pedal edema. Gastrointestinal system: Abdomen is nondistended, soft and nontender. No organomegaly or masses felt. Normal bowel sounds heard. Central nervous system: Alert and oriented. No focal neurological deficits. Extremities: Symmetric 5 x 5 power. Skin: No rashes, lesions or ulcers Psychiatry: Judgement and insight appear normal. Mood & affect appropriate.   Data Reviewed: I have personally reviewed following labs and imaging studies  CBC: Recent Labs  Lab 03/07/21 0133 03/07/21 0909 03/07/21 1430 03/08/21 0616  WBC 21.8*  --   --  13.7*  NEUTROABS 19.6*  --   --   --   HGB 12.6 13.0 12.6 12.1  HCT 43.1 44.5 42.7 41.4  MCV 86.7  --   --  87.3  PLT 309  --   --  867    Basic Metabolic Panel: Recent Labs  Lab 03/07/21 0133 03/08/21 0616  NA 140 143  K 3.6 4.0  CL 106 109  CO2 26 27  GLUCOSE 169* 144*  BUN 10 15  CREATININE 0.76 0.70  CALCIUM 8.7* 8.7*  MG  --  2.0  PHOS  --  1.4*    GFR: Estimated Creatinine Clearance: 87.9 mL/min (by C-G formula based on SCr of 0.7 mg/dL).  Liver Function Tests: Recent Labs  Lab 03/07/21 0133 03/08/21 0616  AST 15 12*  ALT 11 10  ALKPHOS 113 84  BILITOT 0.5 0.6  PROT 7.4 6.8  ALBUMIN 3.2* 3.1*    CBG: No results for input(s): GLUCAP in the last 168 hours.  Recent Results (from the past 240 hour(s))  C Difficile Quick Screen w PCR reflex     Status: None   Collection Time: 03/07/21  5:11 AM   Specimen: Stool  Result Value Ref Range Status   C Diff antigen NEGATIVE NEGATIVE Final   C Diff toxin NEGATIVE NEGATIVE Final   C Diff interpretation No C. difficile detected.  Final    Comment: Performed at [facility], [address] [place], [place] [address]  Resp Panel by RT-PCR (Flu A&B, Covid)     Status: None   Collection Time: 03/07/21  5:18 AM   Specimen: Nasopharyngeal(NP) swabs in vial transport medium  Result Value Ref Range Status   SARS Coronavirus 2 by RT PCR NEGATIVE NEGATIVE Final    Comment: (NOTE) SARS-CoV-2 target nucleic acids are NOT DETECTED.  The SARS-CoV-2 RNA is generally detectable in upper respiratory specimens during the acute phase of infection. The lowest concentration of SARS-CoV-2 viral copies this assay can detect is 138 copies/mL. A negative result does not preclude SARS-Cov-2 infection and should not be used as the sole basis for treatment or other patient management decisions. A negative result  may occur with  improper specimen collection/handling, submission of specimen other than nasopharyngeal swab, presence of viral mutation(s) within the areas targeted by this assay, and inadequate number of viral copies(<138 copies/mL). A negative result must be combined with clinical observations, patient history, and epidemiological information. The expected result is Negative.  Fact Sheet for Patients:  EntrepreneurPulse.com.au  Fact Sheet for Healthcare Providers:  IncredibleEmployment.be  This test is no t yet approved or cleared by the [place] FDA and  has been authorized for detection and/or diagnosis of SARS-CoV-2 by FDA under an Emergency Use Authorization (EUA). This EUA will remain  in effect (meaning this test can be used) for the duration of the COVID-19 declaration under Section 564(b)(1) of the Act, 21 U.S.C.section 360bbb-3(b)(1), unless the authorization is terminated  or revoked sooner.       Influenza A by PCR NEGATIVE NEGATIVE Final   Influenza B by PCR NEGATIVE NEGATIVE Final    Comment: (NOTE) The Xpert Xpress SARS-CoV-2/FLU/RSV plus assay is intended as an aid in the diagnosis of influenza from  Nasopharyngeal swab specimens and should not be used as a sole basis for treatment. Nasal washings and aspirates are unacceptable for Xpert Xpress SARS-CoV-2/FLU/RSV testing.  Fact Sheet for Patients: EntrepreneurPulse.com.au  Fact Sheet for Healthcare Providers: IncredibleEmployment.be  This test is not yet approved or cleared by the [place] FDA and has been authorized for detection and/or diagnosis of SARS-CoV-2 by FDA under an Emergency Use Authorization (EUA). This EUA will remain in effect (meaning this test can be used) for the duration of the COVID-19 declaration under Section 564(b)(1) of the Act, 21 U.S.C. section 360bbb-3(b)(1), unless the authorization is terminated or revoked.  Performed at [facility], [address] [place], [place] [address]   MRSA PCR Screening     Status: None   Collection Time: 03/07/21  9:05 AM   Specimen: Nasopharyngeal  Result Value Ref Range Status   MRSA by PCR NEGATIVE NEGATIVE Final    Comment:        The GeneXpert MRSA Assay (FDA approved for NASAL specimens only), is one component of a comprehensive MRSA colonization surveillance program. It is not intended to diagnose MRSA infection nor to guide or monitor treatment for MRSA infections. Performed at [facility], [address], [place],  [address]      Radiology Studies: CT ABDOMEN PELVIS WO CONTRAST  Result Date: 03/07/2021 CLINICAL DATA:  Nonlocalized abdominal pain.  Zofran with no relief. EXAM: CT ABDOMEN AND PELVIS WITHOUT CONTRAST TECHNIQUE: Multidetector CT imaging of the abdomen and pelvis was performed following the standard protocol without IV contrast. COMPARISON:  None. FINDINGS: Lower chest: Bilateral lower lobe subsegmental atelectasis. Partially visualized moderate volume hiatal hernia. Hepatobiliary: No focal liver abnormality. No gallstones, gallbladder wall thickening, or pericholecystic fluid. No biliary  dilatation. Pancreas: Diffusely atrophic. No focal lesion. Otherwise normal pancreatic contour. No surrounding inflammatory changes. No main pancreatic ductal dilatation. Spleen: Normal in size without focal abnormality. Adrenals/Urinary Tract: No adrenal nodule bilaterally. A 1.1 cm right nephrolithiasis. No hydronephrosis. Renal cortical scarring of the right kidney. No contour-deforming renal mass. No ureterolithiasis or hydroureter. The urinary bladder is unremarkable. Stomach/Bowel: Gaseous distension of the stomach contained in the abdomen. No evidence of bowel wall thickening or dilatation. No pneumatosis. Likely endoluminal clip within the cecum (5:39). The appendix not definitely identified. Vascular/Lymphatic: No abdominal aorta or iliac aneurysm. Mild atherosclerotic plaque of the aorta and its branches. No abdominal, pelvic, or inguinal lymphadenopathy. Reproductive: Uterus and bilateral  adnexa are unremarkable. Other: No intraperitoneal free fluid. No intraperitoneal free gas. No organized fluid collection. Musculoskeletal: No abdominal wall hernia or abnormality. No suspicious lytic or blastic osseous lesions. No acute displaced fracture. Multilevel degenerative changes of the spine. Grade 1 anterolisthesis of L5 on S1. IMPRESSION: 1. Partially visualized moderate volume hiatal hernia. Gaseous distension of the portion of the stomach that is contained within the abdomen. Limited evaluation for ischemia on this noncontrast study. 2. Nonobstructive 1.1 cm right nephrolithiasis. Electronically Signed   By: Iven Finn M.D.   On: 03/07/2021 03:58    Scheduled Meds: . ascorbic acid  500 mg Oral BID  . DULoxetine  30 mg Oral Daily  . fluticasone  1 spray Each Nare QHS  . furosemide  20 mg Oral Daily  . iron polysaccharides  150 mg Oral BID  . isosorbide mononitrate  30 mg Oral Daily  . levETIRAcetam  500 mg Oral BID  . levothyroxine  137 mcg Oral Daily  . LORazepam  0.5 mg Oral QHS  .  melatonin  3 mg Oral QHS  . metoprolol tartrate  25 mg Oral BID  . pantoprazole  40 mg Intravenous Q12H  . pravastatin  80 mg Oral QPM  . pregabalin  150 mg Oral BID  . vitamin B-12  1,000 mcg Oral Daily   Continuous Infusions: . potassium PHOSPHATE IVPB (in mmol)       LOS: 1 day   Time spent: 36 mins   Leviticus Harton Wynetta Emery, MD How to contact the [facility] Attending or Consulting provider [place] or covering provider during after hours [place], for this patient?  1. Check the care team in [facility] [place] [facility] and look for a) attending/consulting TRH provider listed and b) the [facility] team listed 2. Log into [email or web address] and use [place]'s universal password to access. If you do not have the password, please contact the hospital operator. 3. Locate the [facility] provider you are looking for under [facility] and page to a number that you can be directly reached. 4. If you still have difficulty reaching the provider, please page the [facility] (Director on Call) for the Hospitalists listed on amion for assistance.  03/08/2021, 1:35 PM

## 2021-03-09 DIAGNOSIS — K922 Gastrointestinal hemorrhage, unspecified: Secondary | ICD-10-CM | POA: Diagnosis not present

## 2021-03-09 DIAGNOSIS — R112 Nausea with vomiting, unspecified: Secondary | ICD-10-CM | POA: Diagnosis not present

## 2021-03-09 DIAGNOSIS — K625 Hemorrhage of anus and rectum: Secondary | ICD-10-CM

## 2021-03-09 DIAGNOSIS — E039 Hypothyroidism, unspecified: Secondary | ICD-10-CM | POA: Diagnosis not present

## 2021-03-09 DIAGNOSIS — R197 Diarrhea, unspecified: Secondary | ICD-10-CM | POA: Diagnosis not present

## 2021-03-09 LAB — GASTROINTESTINAL PANEL BY PCR, STOOL (REPLACES STOOL CULTURE)

## 2021-03-09 LAB — HEMOGLOBIN A1C
Hgb A1c MFr Bld: 5.4 % (ref 4.8–5.6)
Mean Plasma Glucose: 108 mg/dL

## 2021-03-09 MED ORDER — APIXABAN 2.5 MG PO TABS: 2.5000 mg | ORAL_TABLET | Freq: Two times a day (BID) | ORAL | 2 refills | Status: DC

## 2021-03-09 MED ORDER — PANTOPRAZOLE SODIUM 40 MG PO TBEC: 40.0000 mg | DELAYED_RELEASE_TABLET | Freq: Every day | ORAL | Status: DC

## 2021-03-09 MED ORDER — METOPROLOL TARTRATE 50 MG PO TABS: 50.0000 mg | ORAL_TABLET | Freq: Two times a day (BID) | ORAL | Status: AC

## 2021-03-09 MED ORDER — LORAZEPAM 0.5 MG PO TABS: 0.5000 mg | ORAL_TABLET | Freq: Every day | ORAL | 0 refills | Status: AC

## 2021-03-09 MED ORDER — OXYCODONE-ACETAMINOPHEN 5-325 MG PO TABS: 1.0000 | ORAL_TABLET | Freq: Two times a day (BID) | ORAL | 0 refills | Status: AC | PRN

## 2021-03-09 NOTE — TOC Initial Note (Addendum)
Transition of Care [facility]) - Initial/Assessment Note    Patient Details  Name: Karen Mccann MRN: [id number] Date of Birth: [birth year]  [facility] CM/SW Contact:    Salome Arnt, LCSW Phone Number: 03/09/2021, 11:04 AM  Clinical Narrative:  Pt admitted from [facility]. Pt unable to complete assessment. LCSW confirmed with pt's daughter plan to return to [facility] when medically stable. LCSW spoke with Jackelyn Poling at [facility] who reports pt has been a resident at facility for many years. Okay to return. Pt is bedbound. TOC will continue to follow.                   Expected Discharge Plan: Skilled Nursing Facility Barriers to Discharge: Continued Medical Work up   Patient Goals and CMS Choice Patient states their goals for this hospitalization and ongoing recovery are:: anticipate return to [facility]      Expected Discharge Plan and Services Expected Discharge Plan: [place] In-house Referral: Clinical Social Work     Living arrangements for the past 2 months: [place]                 DME Arranged: N/A                    Prior Living Arrangements/Services Living arrangements for the past 2 months: [place] Lives with:: Facility Resident Patient language and need for interpreter reviewed:: Yes        Need for Family Participation in Patient Care: Yes (Comment) Care giver support system in place?: Yes (comment)   Criminal Activity/Legal Involvement Pertinent to Current Situation/Hospitalization: No - Comment as needed  Activities of Daily Living Home Assistive Devices/Equipment: None ADL Screening (condition at time of admission) Patient's cognitive ability adequate to safely complete daily activities?: Yes Is the patient deaf or have difficulty hearing?: No Does the patient have difficulty seeing, even when wearing glasses/contacts?: No Does the patient have difficulty concentrating, remembering, or  making decisions?: No Patient able to express need for assistance with ADLs?: Yes Does the patient have difficulty dressing or bathing?: Yes Independently performs ADLs?: No Communication: Independent Dressing (OT): Dependent Is this a change from baseline?: Pre-admission baseline Grooming: Dependent Is this a change from baseline?: Pre-admission baseline Feeding: Independent Bathing: Dependent Is this a change from baseline?: Pre-admission baseline Toileting: Dependent Is this a change from baseline?: Pre-admission baseline In/Out Bed: Dependent Is this a change from baseline?: Pre-admission baseline Walks in Home: Dependent Is this a change from baseline?: Pre-admission baseline Does the patient have difficulty walking or climbing stairs?: Yes Weakness of Legs: Left Weakness of Arms/Hands: Left  Permission Sought/Granted                  Emotional Assessment   Attitude/Demeanor/Rapport: Unable to Assess Affect (typically observed): Unable to Assess   Alcohol / Substance Use: Not Applicable Psych Involvement: No (comment)  Admission diagnosis:  GI bleed [K92.2] Nausea vomiting and diarrhea [R11.2, R19.7] Gastrointestinal hemorrhage, unspecified gastrointestinal hemorrhage type [K92.2] Patient Active Problem List   Diagnosis Date Noted  . GI bleed 03/07/2021  . Nausea & vomiting 03/07/2021  . Diarrhea 03/07/2021  . Abdominal pain 03/07/2021  . Leukocytosis 03/07/2021  . Hyperglycemia 03/07/2021  . Prolonged QT interval 03/07/2021  . Nausea vomiting and diarrhea   . Rectal bleeding   . Acute on chronic anemia 10/19/2019  . COVID-19 virus infection 10/19/2019  . Hypokalemia 10/19/2019  . Status epilepticus ([place]) 02/08/2019  . Anemia  05/12/2018  . Sepsis ([place]) 03/23/2016  . 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 Lower urinary tract infectious disease 09/17/2015  . Stroke ([place]) 05/09/2015  . Hypothyroid 05/09/2015  . Bipolar 1 disorder ([place]) 05/09/2015   . Seizures ([place]) 05/09/2015  . Hyperlipidemia 05/09/2015  . COPD (chronic obstructive pulmonary disease) ([place]) 05/09/2015  . Depression 05/09/2015  . Anxiety 05/09/2015  . General weakness 05/09/2015  . CAD (coronary artery disease) 05/09/2015  . 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 HTN (hypertension) 05/09/2015  . Chronic pain 05/09/2015  . Esophageal reflux disease 05/09/2015  . Other heart disorders in diseases classified elsewhere 05/09/2015  . Paraplegia ([place]) 05/09/2015   PCP:  Caprice Renshaw, MD Pharmacy:   [facility], [place] [address] Phone: [phone] Fax: [phone]  [place], [place] [address] [place] [address] Phone: [phone] Fax: [phone]     Social Determinants of Health ([place]) Interventions    Readmission Risk Interventions No flowsheet data found.

## 2021-03-09 NOTE — Discharge Summary (Addendum)
Physician Discharge Summary  Karen Mccann IWP:[id number] DOB: March 14, 1951 DOA: 03/07/2021  PCP: Caprice Renshaw, MD  Admit date: 03/07/2021 Discharge date: 03/09/2021  Admitted From:  SNF  Disposition:  SNF  Recommendations for Outpatient Follow-up:  1. Restart apixaban on 03/10/21.  2. Bleeding precautions while on anticoagulation therapy 3. Follow up with [facility] Dr. Olevia Perches office in 1 month  Discharge Condition: STABLE   CODE STATUS: FULL DIET: heart healthy   Brief Hospitalization Summary: Please see all hospital notes, images, labs for full details of the hospitalization. 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 Patient was treated at thenursing facility with Imodium and Zofran without relief. She also complains of pain in the abdomen, pain was described as burning sensation with radiation to the chest. EMS was activated and patient was taken to the ED for further evaluation and management. Patient is bedbound and wheelchair-bound at baseline due to prior multiple ischemic strokes.  Assessment & Plan:   Principal Problem:   GI bleed Active Problems:   Stroke ([place])   Hypothyroid   Hyperlipidemia   CAD (coronary artery disease)   HTN (hypertension)   Nausea & vomiting   Diarrhea   Abdominal pain   Leukocytosis   Hyperglycemia   Prolonged QT interval   Nausea vomiting and diarrhea   Rectal bleeding  Acute gastroenteritis - RESOLVED  Improved with supportive measures She is now tolerating a heart healthy diet  Suspected acute GI bleed / Mallory Weiss Tear Pt has very poorly controlled GERD Hg holding stable.  Hemoccult was positive Treated with IV  Protonix  Gastroenterologist team consultation appreciated DC on protonix 40 mg po daily   Nausea, vomitingand abdominal pain - secondary to acute gastroenteritis  Improving with supportive measures  Treated with IV Compazine 10 mg every 6 hours as needed Treated with IV NS  RESOLVED    Prolonged QTc RESOLVED Magnesium level WNL Follow up EKG shows normalization of QTc  Hypophosphatemia  IV replacement ordered 5/26  Leukocytosis, reactive  WBC 21.8,patient denies fever or chills Procalcitonin <0.10 WBC trending down with supportive measures  Hyperglycemia possibly reactive Blood glucose 169,she has no history of T2DM A1c pending   Hypophosphatemia  IV replacement was given  History of stroke/ CAD Continue home meds when patient resumes oral intake Resume home apixaban on 03/10/21.    Hypothyroidism/Essential hypertension/Hyperlipidemia Continue home meds   Sinus Tachycardia - resolved now  Metoprolol increased to 50 mg BID with good results and tolerating well   DVT prophylaxis:SCDs  Code Status:Full code  Family Communication:None at bedside  Disposition Plan: Patient is from:SNF Anticipated DC to:SNF   Discharge Diagnoses:  Principal Problem:   GI bleed Active Problems:   Stroke ([place])   Hypothyroid   Hyperlipidemia   CAD (coronary artery disease)   HTN (hypertension)   Nausea & vomiting   Diarrhea   Abdominal pain   Leukocytosis   Hyperglycemia   Prolonged QT interval   Nausea vomiting and diarrhea   Rectal bleeding   Discharge Instructions:  Allergies as of 03/09/2021      Reactions   Formaldehyde    Unknown   Latex Other (See Comments)   unknown      Medication List    STOP taking these medications  famotidine 20 MG tablet Commonly known as: PEPCID   pravastatin 80 MG tablet Commonly known as: PRAVACHOL     TAKE these medications   acetaminophen 325 MG  tablet Commonly known as: TYLENOL Take 650 mg by mouth every 8 (eight) hours as needed.   apixaban 2.5 MG Tabs tablet Commonly known as: ELIQUIS Take 1 tablet (2.5 mg total) by mouth 2 (two) times daily. Start taking on: Mar 10, 2021   ascorbic acid 500 MG tablet Commonly known as: VITAMIN C Take 500 mg by mouth 2 (two) times daily.   DULoxetine 30 MG capsule Commonly known as: CYMBALTA Take 30 mg by mouth daily.   escitalopram 20 MG tablet Commonly known as: LEXAPRO Take 20 mg by mouth daily.   fluticasone 50 MCG/ACT nasal spray Commonly known as: FLONASE Place 1 spray into both nostrils at bedtime.   furosemide 20 MG tablet Commonly known as: LASIX Take 20 mg by mouth daily.   hydrOXYzine 25 MG tablet Commonly known as: ATARAX/VISTARIL Take 25 mg by mouth every 6 (six) hours as needed for itching.   ipratropium 0.03 % nasal spray Commonly known as: ATROVENT Place 2 sprays into both nostrils every 6 (six) hours as needed for rhinitis.   iron polysaccharides 150 MG capsule Commonly known as: NIFEREX Take 1 capsule (150 mg total) by mouth 2 (two) times daily.   isosorbide dinitrate 30 MG tablet Commonly known as: ISORDIL Take 30 mg by mouth daily.   levETIRAcetam 500 MG tablet Commonly known as: Keppra Take 1 tablet (500 mg total) by mouth 2 (two) times daily for 30 days.   levothyroxine 137 MCG tablet Commonly known as: SYNTHROID Take 137 mcg by mouth daily.   Lidocaine 4 % Ptch Apply 1 patch topically See admin instructions. 12 hours on and 12 hours off   LORazepam 0.5 MG tablet Commonly known as: ATIVAN Take 1 tablet (0.5 mg total) by mouth at bedtime.   melatonin 3 MG Tabs tablet Take 3 mg by mouth at bedtime.   metoprolol tartrate 50 MG tablet Commonly known as: LOPRESSOR Take 1 tablet (50 mg total) by mouth 2 (two) times daily. What changed:   medication strength  how much to take   nitroGLYCERIN 0.4 MG SL tablet Commonly known as:  NITROSTAT Place 0.4 mg under the tongue every 5 (five) minutes as needed for chest pain.   nystatin powder Generic drug: nystatin Apply 1 application topically 2 (two) times daily.   ondansetron 4 MG tablet Commonly known as: ZOFRAN Take 4 mg by mouth every 8 (eight) hours as needed for nausea or vomiting.   oxyCODONE-acetaminophen 5-325 MG tablet Commonly known as: PERCOCET/ROXICET Take 1 tablet by mouth every 12 (twelve) hours as needed for moderate pain or severe pain.   pantoprazole 40 MG tablet Commonly known as: PROTONIX Take 1 tablet (40 mg total) by mouth daily. What changed:   medication strength  how much to take   polyethylene glycol powder 17 GM/SCOOP powder Commonly known as: GLYCOLAX/MIRALAX Take 17 g by mouth daily.   pregabalin 150 MG capsule Commonly known as: LYRICA Take 150 mg by mouth 2 (two) times daily. What changed: Another medication with the same name was removed. Continue taking this medication, and follow the directions you see here.   rosuvastatin 5 MG tablet Commonly known as: CRESTOR Take 5 mg by mouth daily.   senna 8.6 MG Tabs tablet Commonly known as: SENOKOT Take 1 tablet by mouth 2 (two) times daily.  tiZANidine 2 MG tablet Commonly known as: ZANAFLEX Take 2 mg by mouth 2 (two) times daily.   vitamin B-12 1000 MCG tablet Commonly known as: CYANOCOBALAMIN Take 1,000 mcg by mouth daily.       Allergies  Allergen Reactions  . Formaldehyde     Unknown  . Latex Other (See Comments)    unknown   Allergies as of 03/09/2021      Reactions   Formaldehyde    Unknown   Latex Other (See Comments)   unknown      Medication List    STOP taking these medications   famotidine 20 MG tablet Commonly known as: PEPCID   pravastatin 80 MG tablet Commonly known as: PRAVACHOL     TAKE these medications   acetaminophen 325 MG tablet Commonly known as: TYLENOL Take 650 mg by mouth every 8 (eight) hours as needed.   apixaban  2.5 MG Tabs tablet Commonly known as: ELIQUIS Take 1 tablet (2.5 mg total) by mouth 2 (two) times daily. Start taking on: Mar 10, 2021   ascorbic acid 500 MG tablet Commonly known as: VITAMIN C Take 500 mg by mouth 2 (two) times daily.   DULoxetine 30 MG capsule Commonly known as: CYMBALTA Take 30 mg by mouth daily.   escitalopram 20 MG tablet Commonly known as: LEXAPRO Take 20 mg by mouth daily.   fluticasone 50 MCG/ACT nasal spray Commonly known as: FLONASE Place 1 spray into both nostrils at bedtime.   furosemide 20 MG tablet Commonly known as: LASIX Take 20 mg by mouth daily.   hydrOXYzine 25 MG tablet Commonly known as: ATARAX/VISTARIL Take 25 mg by mouth every 6 (six) hours as needed for itching.   ipratropium 0.03 % nasal spray Commonly known as: ATROVENT Place 2 sprays into both nostrils every 6 (six) hours as needed for rhinitis.   iron polysaccharides 150 MG capsule Commonly known as: NIFEREX Take 1 capsule (150 mg total) by mouth 2 (two) times daily.   isosorbide dinitrate 30 MG tablet Commonly known as: ISORDIL Take 30 mg by mouth daily.   levETIRAcetam 500 MG tablet Commonly known as: Keppra Take 1 tablet (500 mg total) by mouth 2 (two) times daily for 30 days.   levothyroxine 137 MCG tablet Commonly known as: SYNTHROID Take 137 mcg by mouth daily.   Lidocaine 4 % Ptch Apply 1 patch topically See admin instructions. 12 hours on and 12 hours off   LORazepam 0.5 MG tablet Commonly known as: ATIVAN Take 1 tablet (0.5 mg total) by mouth at bedtime.   melatonin 3 MG Tabs tablet Take 3 mg by mouth at bedtime.   metoprolol tartrate 50 MG tablet Commonly known as: LOPRESSOR Take 1 tablet (50 mg total) by mouth 2 (two) times daily. What changed:   medication strength  how much to take   nitroGLYCERIN 0.4 MG SL tablet Commonly known as: NITROSTAT Place 0.4 mg under the tongue every 5 (five) minutes as needed for chest pain.   nystatin  powder Generic drug: nystatin Apply 1 application topically 2 (two) times daily.   ondansetron 4 MG tablet Commonly known as: ZOFRAN Take 4 mg by mouth every 8 (eight) hours as needed for nausea or vomiting.   oxyCODONE-acetaminophen 5-325 MG tablet Commonly known as: PERCOCET/ROXICET Take 1 tablet by mouth every 12 (twelve) hours as needed for moderate pain or severe pain.   pantoprazole 40 MG tablet Commonly known as: PROTONIX Take 1 tablet (40 mg total) by mouth daily.  What changed:   medication strength  how much to take   polyethylene glycol powder 17 GM/SCOOP powder Commonly known as: GLYCOLAX/MIRALAX Take 17 g by mouth daily.   pregabalin 150 MG capsule Commonly known as: LYRICA Take 150 mg by mouth 2 (two) times daily. What changed: Another medication with the same name was removed. Continue taking this medication, and follow the directions you see here.   rosuvastatin 5 MG tablet Commonly known as: CRESTOR Take 5 mg by mouth daily.   senna 8.6 MG Tabs tablet Commonly known as: SENOKOT Take 1 tablet by mouth 2 (two) times daily.   tiZANidine 2 MG tablet Commonly known as: ZANAFLEX Take 2 mg by mouth 2 (two) times daily.   vitamin B-12 1000 MCG tablet Commonly known as: CYANOCOBALAMIN Take 1,000 mcg by mouth daily.       Procedures/Studies: CT ABDOMEN PELVIS WO CONTRAST  Result Date: 03/07/2021 CLINICAL DATA:  Nonlocalized abdominal pain.  Zofran with no relief. EXAM: CT ABDOMEN AND PELVIS WITHOUT CONTRAST TECHNIQUE: Multidetector CT imaging of the abdomen and pelvis was performed following the standard protocol without IV contrast. COMPARISON:  None. FINDINGS: Lower chest: Bilateral lower lobe subsegmental atelectasis. Partially visualized moderate volume hiatal hernia. Hepatobiliary: No focal liver abnormality. No gallstones, gallbladder wall thickening, or pericholecystic fluid. No biliary dilatation. Pancreas: Diffusely atrophic. No focal lesion.  Otherwise normal pancreatic contour. No surrounding inflammatory changes. No main pancreatic ductal dilatation. Spleen: Normal in size without focal abnormality. Adrenals/Urinary Tract: No adrenal nodule bilaterally. A 1.1 cm right nephrolithiasis. No hydronephrosis. Renal cortical scarring of the right kidney. No contour-deforming renal mass. No ureterolithiasis or hydroureter. The urinary bladder is unremarkable. Stomach/Bowel: Gaseous distension of the stomach contained in the abdomen. No evidence of bowel wall thickening or dilatation. No pneumatosis. Likely endoluminal clip within the cecum (5:39). The appendix not definitely identified. Vascular/Lymphatic: No abdominal aorta or iliac aneurysm. Mild atherosclerotic plaque of the aorta and its branches. No abdominal, pelvic, or inguinal lymphadenopathy. Reproductive: Uterus and bilateral adnexa are unremarkable. Other: No intraperitoneal free fluid. No intraperitoneal free gas. No organized fluid collection. Musculoskeletal: No abdominal wall hernia or abnormality. No suspicious lytic or blastic osseous lesions. No acute displaced fracture. Multilevel degenerative changes of the spine. Grade 1 anterolisthesis of L5 on S1. IMPRESSION: 1. Partially visualized moderate volume hiatal hernia. Gaseous distension of the portion of the stomach that is contained within the abdomen. Limited evaluation for ischemia on this noncontrast study. 2. Nonobstructive 1.1 cm right nephrolithiasis. Electronically Signed   By: Iven Finn M.D.   On: 03/07/2021 03:58      Subjective: Pt reports she is tolerating her diet much better today.  No further emesis or bleeding.    Discharge Exam: Vitals:   03/09/21 1011 03/09/21 1429  BP: (!) 118/50 (!) 113/56  Pulse: 99 (!) 107  Resp: 16 18  Temp: 98.3 F (36.8 C) 98.5 F (36.9 C)  SpO2: 100% 99%   Vitals:   03/08/21 2001 03/09/21 0400 03/09/21 1011 03/09/21 1429  BP:  116/66 (!) 118/50 (!) 113/56  Pulse:  70 99 (!)  107  Resp:  16 16 18   Temp:  99.2 F (37.3 C) 98.3 F (36.8 C) 98.5 F (36.9 C)  TempSrc:   Oral Oral  SpO2: 94% 94% 100% 99%  Weight:      Height:       General exam: Appears calm and comfortable  Respiratory system: Clear to auscultation. Respiratory effort normal. Cardiovascular system: normal S1 &  S2 heard. No JVD, murmurs, rubs, gallops or clicks. No pedal edema. Gastrointestinal system: Abdomen is nondistended, soft and nontender. No organomegaly or masses felt. Normal bowel sounds heard. Central nervous system: Alert and oriented. No focal neurological deficits. Extremities: Symmetric 5 x 5 power. Skin: No rashes, lesions or ulcers Psychiatry: Judgement and insight appear normal. Mood & affect appropriate.    The results of significant diagnostics from this hospitalization (including imaging, microbiology, ancillary and laboratory) are listed below for reference.     Microbiology: Recent Results (from the past 240 hour(s))  C Difficile Quick Screen w PCR reflex     Status: None   Collection Time: 03/07/21  5:11 AM   Specimen: Stool  Result Value Ref Range Status   C Diff antigen NEGATIVE NEGATIVE Final   C Diff toxin NEGATIVE NEGATIVE Final   C Diff interpretation No C. difficile detected.  Final    Comment: Performed at [facility], [address], [place], [place] [address]  Gastrointestinal Panel by PCR , Stool     Status: None   Collection Time: 03/07/21  5:16 AM   Specimen: Stool  Result Value Ref Range Status   Campylobacter species NOT DETECTED NOT DETECTED Final   Plesimonas shigelloides NOT DETECTED NOT DETECTED Final   Salmonella species NOT DETECTED NOT DETECTED Final   Yersinia enterocolitica NOT DETECTED NOT DETECTED Final   Vibrio species NOT DETECTED NOT DETECTED Final   Vibrio cholerae NOT DETECTED NOT DETECTED Final   Enteroaggregative E coli (EAEC) NOT DETECTED NOT DETECTED Final   Enteropathogenic E coli (EPEC) NOT DETECTED NOT DETECTED Final    Enterotoxigenic E coli (ETEC) NOT DETECTED NOT DETECTED Final   Shiga like toxin producing E coli (STEC) NOT DETECTED NOT DETECTED Final   Shigella/Enteroinvasive E coli (EIEC) NOT DETECTED NOT DETECTED Final   Cryptosporidium NOT DETECTED NOT DETECTED Final   Cyclospora cayetanensis NOT DETECTED NOT DETECTED Final   Entamoeba histolytica NOT DETECTED NOT DETECTED Final   Giardia lamblia NOT DETECTED NOT DETECTED Final   Adenovirus F40/41 NOT DETECTED NOT DETECTED Final   Astrovirus NOT DETECTED NOT DETECTED Final   Norovirus GI/GII NOT DETECTED NOT DETECTED Final   Rotavirus A NOT DETECTED NOT DETECTED Final   Sapovirus (I, II, IV, and V) NOT DETECTED NOT DETECTED Final    Comment: Performed at [facility], [address] [place], [place] [address]  Resp Panel by RT-PCR (Flu A&B, Covid)     Status: None   Collection Time: 03/07/21  5:18 AM   Specimen: Nasopharyngeal(NP) swabs in vial transport medium  Result Value Ref Range Status   SARS Coronavirus 2 by RT PCR NEGATIVE NEGATIVE Final    Comment: (NOTE) SARS-CoV-2 target nucleic acids are NOT DETECTED.  The SARS-CoV-2 RNA is generally detectable in upper respiratory specimens during the acute phase of infection. The lowest concentration of SARS-CoV-2 viral copies this assay can detect is 138 copies/mL. A negative result does not preclude SARS-Cov-2 infection and should not be used as the sole basis for treatment or other patient management decisions. A negative result may occur with  improper specimen collection/handling, submission of specimen other than nasopharyngeal swab, presence of viral mutation(s) within the areas targeted by this assay, and inadequate number of viral copies(<138 copies/mL). A negative result must be combined with clinical observations, patient history, and epidemiological information. The expected result is Negative.  Fact Sheet for Patients:  EntrepreneurPulse.com.au  Fact  Sheet for Healthcare Providers:  IncredibleEmployment.be  This test  is no t yet approved or cleared by the [place] and  has been authorized for detection and/or diagnosis of SARS-CoV-2 by FDA under an Emergency Use Authorization (EUA). This EUA will remain  in effect (meaning this test can be used) for the duration of the COVID-19 declaration under Section 564(b)(1) of the Act, 21 U.S.C.section 360bbb-3(b)(1), unless the authorization is terminated  or revoked sooner.       Influenza A by PCR NEGATIVE NEGATIVE Final   Influenza B by PCR NEGATIVE NEGATIVE Final    Comment: (NOTE) The Xpert Xpress SARS-CoV-2/FLU/RSV plus assay is intended as an aid in the diagnosis of influenza from Nasopharyngeal swab specimens and should not be used as a sole basis for treatment. Nasal washings and aspirates are unacceptable for Xpert Xpress SARS-CoV-2/FLU/RSV testing.  Fact Sheet for Patients: EntrepreneurPulse.com.au  Fact Sheet for Healthcare Providers: IncredibleEmployment.be  This test is not yet approved or cleared by the [place] FDA and has been authorized for detection and/or diagnosis of SARS-CoV-2 by FDA under an Emergency Use Authorization (EUA). This EUA will remain in effect (meaning this test can be used) for the duration of the COVID-19 declaration under Section 564(b)(1) of the Act, 21 U.S.C. section 360bbb-3(b)(1), unless the authorization is terminated or revoked.  Performed at [facility], [address] [place], [place] [address]   MRSA PCR Screening     Status: None   Collection Time: 03/07/21  9:05 AM   Specimen: Nasopharyngeal  Result Value Ref Range Status   MRSA by PCR NEGATIVE NEGATIVE Final    Comment:        The GeneXpert MRSA Assay (FDA approved for NASAL specimens only), is one component of a comprehensive MRSA colonization surveillance program. It is not intended to diagnose  MRSA infection nor to guide or monitor treatment for MRSA infections. Performed at [facility], [address] [place], [place] [address]      Labs: BNP (last 3 results) No results for input(s): BNP in the last 8760 hours. Basic Metabolic Panel: Recent Labs  Lab 03/07/21 0133 03/08/21 0616  NA 140 143  K 3.6 4.0  CL 106 109  CO2 26 27  GLUCOSE 169* 144*  BUN 10 15  CREATININE 0.76 0.70  CALCIUM 8.7* 8.7*  MG  --  2.0  PHOS  --  1.4*   Liver Function Tests: Recent Labs  Lab 03/07/21 0133 03/08/21 0616  AST 15 12*  ALT 11 10  ALKPHOS 113 84  BILITOT 0.5 0.6  PROT 7.4 6.8  ALBUMIN 3.2* 3.1*   Recent Labs  Lab 03/07/21 0133  LIPASE 23   No results for input(s): AMMONIA in the last 168 hours. CBC: Recent Labs  Lab 03/07/21 0133 03/07/21 0909 03/07/21 1430 03/08/21 0616  WBC 21.8*  --   --  13.7*  NEUTROABS 19.6*  --   --   --   HGB 12.6 13.0 12.6 12.1  HCT 43.1 44.5 42.7 41.4  MCV 86.7  --   --  87.3  PLT 309  --   --  331   Cardiac Enzymes: No results for input(s): CKTOTAL, CKMB, CKMBINDEX, TROPONINI in the last 168 hours. BNP: Invalid input(s): POCBNP CBG: No results for input(s): GLUCAP in the last 168 hours. D-Dimer No results for input(s): DDIMER in the last 72 hours. Hgb A1c Recent Labs    03/08/21 0616  HGBA1C 5.4   Lipid Profile No results for input(s): CHOL, HDL, LDLCALC, TRIG, CHOLHDL, LDLDIRECT in  the last 72 hours. Thyroid function studies No results for input(s): TSH, T4TOTAL, T3FREE, THYROIDAB in the last 72 hours.  Invalid input(s): FREET3 Anemia work up No results for input(s): VITAMINB12, FOLATE, FERRITIN, TIBC, IRON, RETICCTPCT in the last 72 hours. Urinalysis    Component Value Date/Time   COLORURINE YELLOW 02/08/2019 0910   APPEARANCEUR CLEAR 02/08/2019 0910   LABSPEC 1.020 02/08/2019 0910   PHURINE 5.5 02/08/2019 0910   GLUCOSEU NEGATIVE 02/08/2019 0910   HGBUR LARGE (A) 02/08/2019 0910   BILIRUBINUR NEGATIVE  02/08/2019 0910   KETONESUR NEGATIVE 02/08/2019 0910   PROTEINUR TRACE (A) 02/08/2019 0910   NITRITE POSITIVE (A) 02/08/2019 0910   LEUKOCYTESUR MODERATE (A) 02/08/2019 0910   Sepsis Labs Invalid input(s): PROCALCITONIN,  WBC,  LACTICIDVEN Microbiology Recent Results (from the past 240 hour(s))  C Difficile Quick Screen w PCR reflex     Status: None   Collection Time: 03/07/21  5:11 AM   Specimen: Stool  Result Value Ref Range Status   C Diff antigen NEGATIVE NEGATIVE Final   C Diff toxin NEGATIVE NEGATIVE Final   C Diff interpretation No C. difficile detected.  Final    Comment: Performed at [facility], [address] [place], [place] [address]  Gastrointestinal Panel by PCR , Stool     Status: None   Collection Time: 03/07/21  5:16 AM   Specimen: Stool  Result Value Ref Range Status   Campylobacter species NOT DETECTED NOT DETECTED Final   Plesimonas shigelloides NOT DETECTED NOT DETECTED Final   Salmonella species NOT DETECTED NOT DETECTED Final   Yersinia enterocolitica NOT DETECTED NOT DETECTED Final   Vibrio species NOT DETECTED NOT DETECTED Final   Vibrio cholerae NOT DETECTED NOT DETECTED Final   Enteroaggregative E coli (EAEC) NOT DETECTED NOT DETECTED Final   Enteropathogenic E coli (EPEC) NOT DETECTED NOT DETECTED Final   Enterotoxigenic E coli (ETEC) NOT DETECTED NOT DETECTED Final   Shiga like toxin producing E coli (STEC) NOT DETECTED NOT DETECTED Final   Shigella/Enteroinvasive E coli (EIEC) NOT DETECTED NOT DETECTED Final   Cryptosporidium NOT DETECTED NOT DETECTED Final   Cyclospora cayetanensis NOT DETECTED NOT DETECTED Final   Entamoeba histolytica NOT DETECTED NOT DETECTED Final   Giardia lamblia NOT DETECTED NOT DETECTED Final   Adenovirus F40/41 NOT DETECTED NOT DETECTED Final   Astrovirus NOT DETECTED NOT DETECTED Final   Norovirus GI/GII NOT DETECTED NOT DETECTED Final   Rotavirus A NOT DETECTED NOT DETECTED Final   Sapovirus (I, II, IV, and V) NOT  DETECTED NOT DETECTED Final    Comment: Performed at [facility], [address] [place], [place] [address]  Resp Panel by RT-PCR (Flu A&B, Covid)     Status: None   Collection Time: 03/07/21  5:18 AM   Specimen: Nasopharyngeal(NP) swabs in vial transport medium  Result Value Ref Range Status   SARS Coronavirus 2 by RT PCR NEGATIVE NEGATIVE Final    Comment: (NOTE) SARS-CoV-2 target nucleic acids are NOT DETECTED.  The SARS-CoV-2 RNA is generally detectable in upper respiratory specimens during the acute phase of infection. The lowest concentration of SARS-CoV-2 viral copies this assay can detect is 138 copies/mL. A negative result does not preclude SARS-Cov-2 infection and should not be used as the sole basis for treatment or other patient management decisions. A negative result may occur with  improper specimen collection/handling, submission of specimen other than nasopharyngeal swab, presence of viral mutation(s) within the areas targeted by this assay, and  inadequate number of viral copies(<138 copies/mL). A negative result must be combined with clinical observations, patient history, and epidemiological information. The expected result is Negative.  Fact Sheet for Patients:  EntrepreneurPulse.com.au  Fact Sheet for Healthcare Providers:  IncredibleEmployment.be  This test is no t yet approved or cleared by the [place] FDA and  has been authorized for detection and/or diagnosis of SARS-CoV-2 by FDA under an Emergency Use Authorization (EUA). This EUA will remain  in effect (meaning this test can be used) for the duration of the COVID-19 declaration under Section 564(b)(1) of the Act, 21 U.S.C.section 360bbb-3(b)(1), unless the authorization is terminated  or revoked sooner.       Influenza A by PCR NEGATIVE NEGATIVE Final   Influenza B by PCR NEGATIVE NEGATIVE Final    Comment: (NOTE) The Xpert Xpress  SARS-CoV-2/FLU/RSV plus assay is intended as an aid in the diagnosis of influenza from Nasopharyngeal swab specimens and should not be used as a sole basis for treatment. Nasal washings and aspirates are unacceptable for Xpert Xpress SARS-CoV-2/FLU/RSV testing.  Fact Sheet for Patients: EntrepreneurPulse.com.au  Fact Sheet for Healthcare Providers: IncredibleEmployment.be  This test is not yet approved or cleared by the [place] FDA and has been authorized for detection and/or diagnosis of SARS-CoV-2 by FDA under an Emergency Use Authorization (EUA). This EUA will remain in effect (meaning this test can be used) for the duration of the COVID-19 declaration under Section 564(b)(1) of the Act, 21 U.S.C. section 360bbb-3(b)(1), unless the authorization is terminated or revoked.  Performed at [facility], [address] [place], [place] [address]   MRSA PCR Screening     Status: None   Collection Time: 03/07/21  9:05 AM   Specimen: Nasopharyngeal  Result Value Ref Range Status   MRSA by PCR NEGATIVE NEGATIVE Final    Comment:        The GeneXpert MRSA Assay (FDA approved for NASAL specimens only), is one component of a comprehensive MRSA colonization surveillance program. It is not intended to diagnose MRSA infection nor to guide or monitor treatment for MRSA infections. Performed at [facility], [address], [place], [place] [address]    Time coordinating discharge: 40 mins  SIGNED:  Irwin Brakeman, MD  [facility] 03/09/2021, 4:51 PM How to contact the [facility] Attending or Consulting provider [place] or covering provider during after hours [place], for this patient?  1. Check the care team in [facility] and look for a) attending/consulting TRH provider listed and b) the [facility] team listed 2. Log into [email or web address] and use [place]'s universal password to access. If you do not have the password, please contact the hospital  operator. 3. Locate the [facility] provider you are looking for under [facility] and page to a number that you can be directly reached. 4. If you still have difficulty reaching the provider, please page the [facility] (Director on Call) for the Hospitalists listed on amion for assistance.

## 2021-03-09 NOTE — Progress Notes (Signed)
Patient urine is brown in color, vital signs look fine but this patient can not seem to stay awake, MD notified, does not have any new orders at this time. Family updated on condition, will continue to monitor.

## 2021-03-09 NOTE — Care Management Important Message (Signed)
Important Message  Patient Details  Name: Karen Mccann MRN: [id number] Date of Birth: [birth year]   Medicare Important Message Given:  Yes     Tommy Medal 03/09/2021, 11:52 AM

## 2021-03-09 NOTE — Progress Notes (Signed)
Subjective:  Feels better. Wants to try solid food. No abdominal pain. Diarrhea improved.  Objective: Vital signs in last 24 hours: Temp:  [99.2 F (37.3 C)-99.6 F (37.6 C)] 99.2 F (37.3 C) (05/27 0400) Pulse Rate:  [70-110] 70 (05/27 0400) Resp:  [16-18] 16 (05/27 0400) BP: (116-149)/(66-88) 116/66 (05/27 0400) SpO2:  [90 %-97 %] 94 % (05/27 0400) Last BM Date: 03/08/21 General:   Alert,  Well-developed, well-nourished, pleasant and cooperative in NAD Head:  Normocephalic and atraumatic. Eyes:  Sclera clear, no icterus.  Abdomen:  Soft, nontender and nondistended.   Normal bowel sounds, without guarding, and without rebound.   Extremities:  Without clubbing, deformity or edema. Neurologic:  Alert and  oriented x4;  grossly normal neurologically. Skin:  Intact without significant lesions or rashes. Psych:  Alert and cooperative. Normal mood and affect.  Intake/Output from previous day: 05/26 0701 - 05/27 0700 In: 1170 [P.O.:1170] Out: -  Intake/Output this shift: No intake/output data recorded.  Lab Results: CBC Recent Labs    03/07/21 0133 03/07/21 0909 03/07/21 1430 03/08/21 0616  WBC 21.8*  --   --  13.7*  HGB 12.6 13.0 12.6 12.1  HCT 43.1 44.5 42.7 41.4  MCV 86.7  --   --  87.3  PLT 309  --   --  331   BMET Recent Labs    03/07/21 0133 03/08/21 0616  NA 140 143  K 3.6 4.0  CL 106 109  CO2 26 27  GLUCOSE 169* 144*  BUN 10 15  CREATININE 0.76 0.70  CALCIUM 8.7* 8.7*   LFTs Recent Labs    03/07/21 0133 03/08/21 0616  BILITOT 0.5 0.6  ALKPHOS 113 84  AST 15 12*  ALT 11 10  PROT 7.4 6.8  ALBUMIN 3.2* 3.1*   Recent Labs    03/07/21 0133  LIPASE 23   PT/INR Recent Labs    03/08/21 0616  LABPROT 15.0  INR 1.2      Imaging Studies: CT ABDOMEN PELVIS WO CONTRAST  Result Date: 03/07/2021 CLINICAL DATA:  Nonlocalized abdominal pain.  Zofran with no relief. EXAM: CT ABDOMEN AND PELVIS WITHOUT CONTRAST TECHNIQUE: Multidetector CT imaging  of the abdomen and pelvis was performed following the standard protocol without IV contrast. COMPARISON:  None. FINDINGS: Lower chest: Bilateral lower lobe subsegmental atelectasis. Partially visualized moderate volume hiatal hernia. Hepatobiliary: No focal liver abnormality. No gallstones, gallbladder wall thickening, or pericholecystic fluid. No biliary dilatation. Pancreas: Diffusely atrophic. No focal lesion. Otherwise normal pancreatic contour. No surrounding inflammatory changes. No main pancreatic ductal dilatation. Spleen: Normal in size without focal abnormality. Adrenals/Urinary Tract: No adrenal nodule bilaterally. A 1.1 cm right nephrolithiasis. No hydronephrosis. Renal cortical scarring of the right kidney. No contour-deforming renal mass. No ureterolithiasis or hydroureter. The urinary bladder is unremarkable. Stomach/Bowel: Gaseous distension of the stomach contained in the abdomen. No evidence of bowel wall thickening or dilatation. No pneumatosis. Likely endoluminal clip within the cecum (5:39). The appendix not definitely identified. Vascular/Lymphatic: No abdominal aorta or iliac aneurysm. Mild atherosclerotic plaque of the aorta and its branches. No abdominal, pelvic, or inguinal lymphadenopathy. Reproductive: Uterus and bilateral adnexa are unremarkable. Other: No intraperitoneal free fluid. No intraperitoneal free gas. No organized fluid collection. Musculoskeletal: No abdominal wall hernia or abnormality. No suspicious lytic or blastic osseous lesions. No acute displaced fracture. Multilevel degenerative changes of the spine. Grade 1 anterolisthesis of L5 on S1. IMPRESSION: 1. Partially visualized moderate volume hiatal hernia. Gaseous distension of the portion of the  stomach that is contained within the abdomen. Limited evaluation for ischemia on this noncontrast study. 2. Nonobstructive 1.1 cm right nephrolithiasis. Electronically Signed   By: Iven Finn M.D.   On: 03/07/2021 03:58  [2  weeks]   Assessment:  Pleasant 70 year old female with multiple comorbidities, on Eliquis for history of strokes, presenting with acute onset vomiting and diarrhea.  GI consulted for GI bleeding, she was found to have dark emesis which was Gastroccult positive, brown stool mixed with blood.  GI bleed: Hemoglobin has remained stable, normal.  Suspect self-limited hematemesis related to Mallory-Weiss tear or possibly reflux esophagitis versus Cameron lesions given large hiatal hernia.  Eliquis has been held.  EGD 2019 for IDA, unremarkable except for a few erosions, 1 cm hiatal hernia.  Acute onset vomiting/diarrhea: Likely viral gastroenteritis or foodborne illness.  C. difficile negative.  GI pathogen panel negative.  Patient reports multiple residents from the nursing home with similar symptoms. Clinically resolved.   GERD: Poorly controlled as an outpatient.  Patient reports doing well on Nexium but can no longer afford.  No medications on the list specifically to treat her reflux.  Recommend PPI therapy given history of large hiatal hernia and poorly controlled reflux symptoms.  Long QT interval: Avoid QT prolonging drugs.   Plan: 1. Transition to pantoprazole 40 mg daily as an outpatient. 2. Resume Eliquis in 24 hours. 3. No indication for endoscopy at this time. 4. Patient will be scheduled for outpatient follow-up with Dr. Laural Golden.  5. Advance diet. 6. We will sign off for now, call with questions.  Laureen Ochs. Bernarda Caffey [facility] [phone] 5/27/202210:21 AM     LOS: 2 days

## 2021-03-09 NOTE — NC FL2 (Signed)
[place] LEVEL OF CARE SCREENING TOOL     IDENTIFICATION  Patient Name: Karen Mccann Birthdate: 04-15-1951 Sex: female Admission Date (Current Location): 03/07/2021  [place] and [place] Number:  [place] [facility] [id number] [place] and Address:  [facility] [address] [place]      Provider Number: [id number]  Attending Physician Name and Address:  Murlean Iba, MD  Relative Name and Phone Number:       Current Level of Care: Hospital Recommended Level of Care: [place] Prior Approval Number:    Date Approved/Denied:   PASRR Number:    Discharge Plan: SNF    Current Diagnoses: Patient Active Problem List   Diagnosis Date Noted  . GI bleed 03/07/2021  . Nausea & vomiting 03/07/2021  . Diarrhea 03/07/2021  . Abdominal pain 03/07/2021  . Leukocytosis 03/07/2021  . Hyperglycemia 03/07/2021  . Prolonged QT interval 03/07/2021  . Nausea vomiting and diarrhea   . Rectal bleeding   . Acute on chronic anemia 10/19/2019  . COVID-19 virus infection 10/19/2019  . Hypokalemia 10/19/2019  . Status epilepticus ([place]) 02/08/2019  . Anemia 05/12/2018  . Sepsis ([place]) 03/23/2016  . 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 Lower urinary tract infectious disease 09/17/2015  . Stroke ([place]) 05/09/2015  . Hypothyroid 05/09/2015  . Bipolar 1 disorder ([place]) 05/09/2015  . Seizures ([place]) 05/09/2015  . Hyperlipidemia 05/09/2015  . COPD (chronic obstructive pulmonary disease) ([place]) 05/09/2015  . Depression 05/09/2015  . Anxiety 05/09/2015  . General weakness 05/09/2015  . CAD (coronary artery disease) 05/09/2015  . 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 HTN (hypertension) 05/09/2015  . Chronic pain 05/09/2015  . Esophageal reflux disease 05/09/2015  . Other heart disorders in diseases classified elsewhere 05/09/2015  . Paraplegia ([place]) 05/09/2015    Orientation RESPIRATION BLADDER Height & Weight     Self  O2 (2L) External  catheter Weight: 259 lb 14.8 oz (117.9 kg) Height:  5\' 6"  (167.6 cm)  BEHAVIORAL SYMPTOMS/MOOD NEUROLOGICAL BOWEL NUTRITION STATUS    Convulsions/Seizures (history) Incontinent Diet (heart healthy. See d/c summary for udpates.)  AMBULATORY STATUS COMMUNICATION OF NEEDS Skin   Total Care Verbally Bruising                       Personal Care Assistance Level of Assistance  Bathing,Feeding,Dressing Bathing Assistance: Maximum assistance Feeding assistance: Limited assistance Dressing Assistance: Maximum assistance     Functional Limitations Info  Sight,Hearing,Speech Sight Info: Impaired Hearing Info: Adequate Speech Info: Adequate    SPECIAL CARE FACTORS FREQUENCY                       Contractures      Additional Factors Info  Code Status,Allergies Code Status Info: Full code Allergies Info: Formaldehyde, Latex           Current Medications (03/09/2021):  This is the current hospital active medication list Current Facility-Administered Medications  Medication Dose Route Frequency Provider Last Rate Last Admin  . ascorbic acid (VITAMIN C) tablet 500 mg  500 mg Oral BID Johnson, Clanford L, MD   500 mg at 03/09/21 1009  . DULoxetine (CYMBALTA) DR capsule 30 mg  30 mg Oral Daily Johnson, Clanford L, MD   30 mg at 03/09/21 1009  . fluticasone (FLONASE) 50 MCG/ACT nasal spray 1 spray  1 spray Each Nare QHS Johnson, Clanford L, MD   1 spray at 03/08/21 2134  . furosemide (  LASIX) tablet 20 mg  20 mg Oral Daily Johnson, Clanford L, MD   20 mg at 03/09/21 1009  . ipratropium (ATROVENT) 0.03 % nasal spray 2 spray  2 spray Each Nare Q6H PRN Johnson, Clanford L, MD      . iron polysaccharides (NIFEREX) capsule 150 mg  150 mg Oral BID Wynetta Emery, Clanford L, MD   150 mg at 03/08/21 0951  . isosorbide mononitrate (IMDUR) 24 hr tablet 30 mg  30 mg Oral Daily Johnson, Clanford L, MD   30 mg at 03/09/21 1009  . levETIRAcetam (KEPPRA) tablet 500 mg  500 mg Oral BID Johnson,  Clanford L, MD   500 mg at 03/09/21 1009  . levothyroxine (SYNTHROID) tablet 137 mcg  137 mcg Oral Daily Wynetta Emery, Clanford L, MD   137 mcg at 03/09/21 0518  . LORazepam (ATIVAN) tablet 0.5 mg  0.5 mg Oral QHS Johnson, Clanford L, MD   0.5 mg at 03/08/21 2133  . melatonin tablet 3 mg  3 mg Oral QHS Johnson, Clanford L, MD   3 mg at 03/08/21 2132  . metoprolol tartrate (LOPRESSOR) tablet 50 mg  50 mg Oral BID Johnson, Clanford L, MD   50 mg at 03/09/21 1009  . nitroGLYCERIN (NITROSTAT) SL tablet 0.4 mg  0.4 mg Sublingual Q5 min PRN Johnson, Clanford L, MD      . oxyCODONE-acetaminophen (PERCOCET/ROXICET) 5-325 MG per tablet 1 tablet  1 tablet Oral Q12H PRN Johnson, Clanford L, MD      . pantoprazole (PROTONIX) injection 40 mg  40 mg Intravenous Q12H Mahala Menghini, PA-C   40 mg at 03/09/21 1009  . pravastatin (PRAVACHOL) tablet 80 mg  80 mg Oral QPM Johnson, Clanford L, MD   80 mg at 03/08/21 1813  . pregabalin (LYRICA) capsule 150 mg  150 mg Oral BID Johnson, Clanford L, MD   150 mg at 03/09/21 1009  . prochlorperazine (COMPAZINE) injection 10 mg  10 mg Intravenous Q6H PRN Adefeso, Oladapo, DO      . vitamin B-12 (CYANOCOBALAMIN) tablet 1,000 mcg  1,000 mcg Oral Daily Wynetta Emery, Clanford L, MD   1,000 mcg at 03/09/21 1009     Discharge Medications: Please see discharge summary for a list of discharge medications.  Relevant Imaging Results:  Relevant Lab Results:   Additional Information    Salome Arnt, LCSW

## 2021-03-10 NOTE — Progress Notes (Signed)
03/10/2021 4:06 PM  Pt seen and examined.  Stable for discharge to SNF.  Please see dictated discharge summary.    Karen Natal, MD  How to contact the [facility] Attending or Consulting provider [place] or covering provider during after hours [place], for this patient?  1. Check the care team in [facility] and look for a) attending/consulting TRH provider listed and b) the [facility] team listed 2. Log into [email or web address] and use [place]'s universal password to access. If you do not have the password, please contact the hospital operator. 3. Locate the [facility] provider you are looking for under [facility] and page to a number that you can be directly reached. 4. If you still have difficulty reaching the provider, please page the [facility] (Director on Call) for the Hospitalists listed on amion for assistance.

## 2021-03-10 NOTE — TOC Transition Note (Signed)
Transition of Care [facility]) - CM/SW Discharge Note   Patient Details  Name: TEANDRA HARLAN MRN: [id number] Date of Birth: August 25, 1951  [facility] of [facility]) CM/SW Contact:  Natasha Bence, LCSW Phone Number: 03/10/2021, 1:03 PM   Clinical Narrative:    CSW notified of readiness for discharge. CSW confirmed with [facility] that they are able to take the patient on 03/10/2021. CSW completed Med necessity and called EMS. Nurse to call report. TOC signing off.   Final next level of care: Skilled Nursing Facility Barriers to Discharge: Barriers Resolved   Patient Goals and CMS Choice Patient states their goals for this hospitalization and ongoing recovery are:: rehab with SNF CMS Medicare.gov Compare Post Acute Care list provided to:: Patient Choice offered to / list presented to : Patient  Discharge Placement                Patient to be transferred to facility by: [facility] Name of family member notified: Renold Don (Daughter)   [phone] Patient and family notified of of transfer: 03/10/21  Discharge Plan and Services In-house Referral: Clinical Social Work              DME Arranged: N/A                    Social Determinants of Health ([place]) Interventions     Readmission Risk Interventions No flowsheet data found.

## 2021-05-31 ENCOUNTER — Ambulatory Visit (INDEPENDENT_AMBULATORY_CARE_PROVIDER_SITE_OTHER): Payer: Medicare (Managed Care) | Admitting: Gastroenterology

## 2021-07-10 ENCOUNTER — Emergency Department (HOSPITAL_COMMUNITY)
Admission: EM | Admit: 2021-07-10 | Discharge: 2021-07-10 | Disposition: A | Discharge status: Home / Self Care | Payer: Medicare (Managed Care) | Attending: Emergency Medicine | Admitting: Emergency Medicine

## 2021-07-10 ENCOUNTER — Emergency Department (HOSPITAL_COMMUNITY): Payer: Medicare (Managed Care)

## 2021-07-10 ENCOUNTER — Encounter (HOSPITAL_COMMUNITY): Payer: Self-pay | Admitting: Emergency Medicine

## 2021-07-10 ENCOUNTER — Other Ambulatory Visit: Payer: Self-pay

## 2021-07-10 DIAGNOSIS — Z79899 Other long term (current) drug therapy: Secondary | ICD-10-CM | POA: Diagnosis not present

## 2021-07-10 DIAGNOSIS — R1110 Vomiting, unspecified: Secondary | ICD-10-CM | POA: Insufficient documentation

## 2021-07-10 DIAGNOSIS — Z8616 Personal history of COVID-19: Secondary | ICD-10-CM | POA: Diagnosis not present

## 2021-07-10 DIAGNOSIS — E039 Hypothyroidism, unspecified: Secondary | ICD-10-CM | POA: Diagnosis not present

## 2021-07-10 DIAGNOSIS — I509 Heart failure, unspecified: Secondary | ICD-10-CM | POA: Insufficient documentation

## 2021-07-10 DIAGNOSIS — Z7951 Long term (current) use of inhaled steroids: Secondary | ICD-10-CM | POA: Diagnosis not present

## 2021-07-10 DIAGNOSIS — Z87891 Personal history of nicotine dependence: Secondary | ICD-10-CM | POA: Insufficient documentation

## 2021-07-10 DIAGNOSIS — R1084 Generalized abdominal pain: Secondary | ICD-10-CM | POA: Insufficient documentation

## 2021-07-10 DIAGNOSIS — R197 Diarrhea, unspecified: Secondary | ICD-10-CM | POA: Insufficient documentation

## 2021-07-10 DIAGNOSIS — Z955 Presence of coronary angioplasty implant and graft: Secondary | ICD-10-CM | POA: Insufficient documentation

## 2021-07-10 DIAGNOSIS — Z7901 Long term (current) use of anticoagulants: Secondary | ICD-10-CM | POA: Diagnosis not present

## 2021-07-10 DIAGNOSIS — I110 Hypertensive heart disease with heart failure: Secondary | ICD-10-CM | POA: Diagnosis not present

## 2021-07-10 DIAGNOSIS — J449 Chronic obstructive pulmonary disease, unspecified: Secondary | ICD-10-CM | POA: Diagnosis not present

## 2021-07-10 DIAGNOSIS — I2510 Atherosclerotic heart disease of native coronary artery without angina pectoris: Secondary | ICD-10-CM | POA: Diagnosis not present

## 2021-07-10 DIAGNOSIS — K920 Hematemesis: Secondary | ICD-10-CM

## 2021-07-10 LAB — COMPREHENSIVE METABOLIC PANEL
ALT: 18 U/L (ref 0–44)
AST: 21 U/L (ref 15–41)
Albumin: 3.3 g/dL — ABNORMAL LOW (ref 3.5–5.0)
Alkaline Phosphatase: 85 U/L (ref 38–126)
Anion gap: 9 (ref 5–15)
BUN: 10 mg/dL (ref 8–23)
CO2: 29 mmol/L (ref 22–32)
Calcium: 8.6 mg/dL — ABNORMAL LOW (ref 8.9–10.3)
Chloride: 105 mmol/L (ref 98–111)
Creatinine, Ser: 0.78 mg/dL (ref 0.44–1.00)
GFR, Estimated: >60 mL/min (ref >60)
Glucose, Bld: 124 mg/dL — ABNORMAL HIGH (ref 70–99)
Potassium: 3.3 mmol/L — ABNORMAL LOW (ref 3.5–5.1)
Sodium: 143 mmol/L (ref 135–145)
Total Bilirubin: 0.6 mg/dL (ref 0.3–1.2)
Total Protein: 7 g/dL (ref 6.5–8.1)

## 2021-07-10 LAB — CBC WITH DIFFERENTIAL/PLATELET
Abs Immature Granulocytes: 0.03 K/uL (ref 0.00–0.07)
Basophils Absolute: 0 K/uL (ref 0.0–0.1)
Basophils Relative: 0 %
Eosinophils Absolute: 0.1 K/uL (ref 0.0–0.5)
Eosinophils Relative: 1 %
HCT: 46.2 % — ABNORMAL HIGH (ref 36.0–46.0)
Hemoglobin: 14.1 g/dL (ref 12.0–15.0)
Immature Granulocytes: 0 %
Lymphocytes Relative: 10 %
Lymphs Abs: 1.3 K/uL (ref 0.7–4.0)
MCH: 27.6 pg (ref 26.0–34.0)
MCHC: 30.5 g/dL (ref 30.0–36.0)
MCV: 90.4 fL (ref 80.0–100.0)
Monocytes Absolute: 0.6 K/uL (ref 0.1–1.0)
Monocytes Relative: 5 %
Neutro Abs: 10.6 K/uL — ABNORMAL HIGH (ref 1.7–7.7)
Neutrophils Relative %: 84 %
Platelets: 206 K/uL (ref 150–400)
RBC: 5.11 MIL/uL (ref 3.87–5.11)
RDW: 17.4 % — ABNORMAL HIGH (ref 11.5–15.5)
WBC: 12.6 K/uL — ABNORMAL HIGH (ref 4.0–10.5)
nRBC: 0 % (ref 0.0–0.2)

## 2021-07-10 LAB — HEMOGLOBIN AND HEMATOCRIT, BLOOD
HCT: 43.9 % (ref 36.0–46.0)
Hemoglobin: 13.3 g/dL (ref 12.0–15.0)

## 2021-07-10 LAB — LIPASE, BLOOD: Lipase: 20 U/L (ref 11–51)

## 2021-07-10 LAB — POC OCCULT BLOOD, ED: Fecal Occult Bld: POSITIVE — AB

## 2021-07-10 MED ORDER — SUCRALFATE 1 G PO TABS: 1.0000 g | ORAL_TABLET | Freq: Three times a day (TID) | ORAL | 0 refills | Status: AC

## 2021-07-10 MED ORDER — SODIUM CHLORIDE 0.9 % IV BOLUS
500.0000 mL | Freq: Once | INTRAVENOUS | Status: AC
Start: 2021-07-10 — End: 2021-07-10
  Administered 2021-07-10: 500 mL via INTRAVENOUS

## 2021-07-10 MED ORDER — ACETAMINOPHEN 500 MG PO TABS
1000.0000 mg | ORAL_TABLET | Freq: Once | ORAL | Status: AC
  Administered 2021-07-10: 1000 mg via ORAL
  Filled 2021-07-10: qty 2

## 2021-07-10 MED ORDER — PANTOPRAZOLE SODIUM 40 MG IV SOLR
40.0000 mg | Freq: Once | INTRAVENOUS | Status: AC
  Administered 2021-07-10: 40 mg via INTRAVENOUS
  Filled 2021-07-10: qty 40

## 2021-07-10 MED ORDER — IOHEXOL 300 MG/ML  SOLN
100.0000 mL | Freq: Once | INTRAMUSCULAR | Status: AC | PRN
  Administered 2021-07-10: 100 mL via INTRAVENOUS

## 2021-07-10 NOTE — ED Provider Notes (Signed)
[facility] EMERGENCY DEPARTMENT Provider Note   CSN: [id number] Arrival date & time: 07/10/21  0256     History Chief Complaint  Patient presents with   Hematemesis    Karen Mccann is a 70 y.o. female.  Patient is a 70 year old female with past medical history of prior stroke with left hemiplegia, seizures, COPD, bipolar.  Patient sent from her extended care facility for evaluation of vomiting.  Patiently apparently vomited this evening and it appeared bloody.  She also reports several episodes of loose stool today that was nonbloody.  Patient describes generalized abdominal cramping that is mild, but denies localized pain.  She denies any fevers or chills.  The history is provided by the patient.      Past Medical History:  Diagnosis Date   Anxiety    Atherosclerotic heart disease of native coronary artery without angina pectoris    Bipolar 1 disorder, depressed (HCC)    Chronic pain syndrome    COPD (chronic obstructive pulmonary disease) ([place])    COVID-19    Depression    Essential (primary) hypertension    Gastroesophageal reflux    GERD without esophagitis    Heart failure (HCC)    Heart failure, unspecified (HCC)    Hemiparesis (HCC)    Hemiplegia (HCC)    Herpesviral infection, unspecified    Hyperlipidemia    Hypothyroidism    Iron deficiency anemia    Long term (current) use of anticoagulants    Major depressive disorder    Opioid dependence, uncomplicated (HCC)    Other disorders of plasma-protein metabolism, not elsewhere classified    Paraplegia, unspecified ([place])    Personal history of other diseases of the circulatory system    Phonological disorder    Presence of aortocoronary bypass graft    Seizures ([place])    Stroke ([place])    Unspecified convulsions ([place])    Vitamin D deficiency     Patient Active Problem List   Diagnosis Date Noted   GI bleed 03/07/2021   Nausea & vomiting 03/07/2021   Diarrhea 03/07/2021   Abdominal pain 03/07/2021    Leukocytosis 03/07/2021   Hyperglycemia 03/07/2021   Prolonged QT interval 03/07/2021   Nausea vomiting and diarrhea    Rectal bleeding    Acute on chronic anemia 10/19/2019   COVID-19 virus infection 10/19/2019   Hypokalemia 10/19/2019   Status epilepticus ([place]) 02/08/2019   Anemia 05/12/2018   Sepsis ([place]) 03/23/2016   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   Lower urinary tract infectious disease 09/17/2015   Stroke ([place]) 05/09/2015   Hypothyroid 05/09/2015   Bipolar 1 disorder ([place]) 05/09/2015   Seizures ([place]) 05/09/2015   Hyperlipidemia 05/09/2015   COPD (chronic obstructive pulmonary disease) ([place]) 05/09/2015   Depression 05/09/2015   Anxiety 05/09/2015   General weakness 05/09/2015   CAD (coronary artery disease) 05/09/2015   Hx of CABG 05/09/2015   HTN (hypertension) 05/09/2015   Chronic pain 05/09/2015   Esophageal reflux disease 05/09/2015   Other heart disorders in diseases classified elsewhere 05/09/2015   Paraplegia ([place]) 05/09/2015    Past Surgical History:  Procedure Laterality Date   CARDIAC SURGERY     cabg   COLONOSCOPY WITH PROPOFOL N/A 05/14/2018   Procedure: COLONOSCOPY WITH PROPOFOL;  Surgeon: Rogene Houston, MD;  [facility];  Service: Endoscopy;  Laterality: N/A;   ESOPHAGOGASTRODUODENOSCOPY (EGD) WITH PROPOFOL N/A 05/14/2018   Procedure: ESOPHAGOGASTRODUODENOSCOPY (EGD) WITH PROPOFOL;  Surgeon: Rogene Houston, MD;  Location: [facility];  Service: Endoscopy;  Laterality: N/A;   POLYPECTOMY  05/14/2018   Procedure: POLYPECTOMY;  Surgeon: Rogene Houston, MD;  Location: [facility] ENDO [facility];  Service: Endoscopy;;  cecal polyp   THYROID SURGERY     70 yrs old     OB History   No obstetric history on file.     Family History  Problem Relation Age of Onset   Hypertension Other    Ovarian cancer Daughter    Ovarian cancer Mother    Breast cancer Mother    Liver disease Neg Hx    Colon cancer Neg Hx     Social History   Tobacco  Use   Smoking status: Former    Packs/day: 0.50    Types: Cigarettes    Quit date: 05/16/2003    Years since quitting: 18.1   Smokeless tobacco: Never  Vaping Use   Vaping Use: Never used  Substance Use Topics   Alcohol use: No    Alcohol/week: 0.0 standard drinks   Drug use: Never    Home Medications Prior to Admission medications   Medication Sig Start Date End Date Taking? Authorizing Provider  acetaminophen (TYLENOL) 325 MG tablet Take 650 mg by mouth every 8 (eight) hours as needed.    [provider]  apixaban (ELIQUIS) 2.5 MG TABS tablet Take 1 tablet (2.5 mg total) by mouth 2 (two) times daily. 03/10/21 04/09/21  Johnson, Clanford L, MD  ascorbic acid (VITAMIN C) 500 MG tablet Take 500 mg by mouth 2 (two) times daily.    [provider]  DULoxetine (CYMBALTA) 30 MG capsule Take 30 mg by mouth daily.     [provider]  escitalopram (LEXAPRO) 20 MG tablet Take 20 mg by mouth daily.    [provider]  fluticasone (FLONASE) 50 MCG/ACT nasal spray Place 1 spray into both nostrils at bedtime.    [provider]  furosemide (LASIX) 20 MG tablet Take 20 mg by mouth daily.     [provider]  hydrOXYzine (ATARAX/VISTARIL) 25 MG tablet Take 25 mg by mouth every 6 (six) hours as needed for itching.     [provider]  ipratropium (ATROVENT) 0.03 % nasal spray Place 2 sprays into both nostrils every 6 (six) hours as needed for rhinitis.    [provider]  iron polysaccharides (NIFEREX) 150 MG capsule Take 1 capsule (150 mg total) by mouth 2 (two) times daily. 10/20/19   Barton Dubois, MD  isosorbide dinitrate (ISORDIL) 30 MG tablet Take 30 mg by mouth daily.    [provider]  levETIRAcetam (KEPPRA) 500 MG tablet Take 1 tablet (500 mg total) by mouth 2 (two) times daily for 30 days. 02/09/19 03/11/19  Manuella Ghazi, Pratik D, DO  levothyroxine (SYNTHROID) 137 MCG tablet Take 137 mcg by mouth daily. 02/07/21   [provider]  Lidocaine 4 % PTCH Apply 1 patch topically See admin instructions. 12 hours on and 12 hours off    [provider]  LORazepam (ATIVAN) 0.5 MG tablet Take 1 tablet (0.5 mg total) by mouth at bedtime. 03/09/21   Johnson, Clanford L, MD  Melatonin 3 MG TABS Take 3 mg by mouth at bedtime.     [provider]  metoprolol tartrate (LOPRESSOR) 50 MG tablet Take 1 tablet (50 mg total) by mouth 2 (two) times daily. 03/09/21   Johnson, Clanford L, MD  nitroGLYCERIN (NITROSTAT) 0.4 MG SL tablet Place 0.4 mg under the tongue every 5 (five) minutes  as needed for chest pain.    [provider]  NYSTATIN powder Apply 1 application topically 2 (two) times daily. 02/07/21   [provider]  ondansetron (ZOFRAN) 4 MG tablet Take 4 mg by mouth every 8 (eight) hours as needed for nausea or vomiting.     [provider]  oxyCODONE-acetaminophen (PERCOCET/ROXICET) 5-325 MG tablet Take 1 tablet by mouth every 12 (twelve) hours as needed for moderate pain or severe pain. 03/09/21   Johnson, Clanford L, MD  pantoprazole (PROTONIX) 40 MG tablet Take 1 tablet (40 mg total) by mouth daily. 03/09/21   Johnson, Clanford L, MD  polyethylene glycol powder (GLYCOLAX/MIRALAX) powder Take 17 g by mouth daily.    [provider]  pregabalin (LYRICA) 150 MG capsule Take 150 mg by mouth 2 (two) times daily. 02/14/21   [provider]  rosuvastatin (CRESTOR) 5 MG tablet Take 5 mg by mouth daily. 02/26/21   [provider]  senna (SENOKOT) 8.6 MG TABS tablet Take 1 tablet by mouth 2 (two) times daily.    [provider]  tiZANidine (ZANAFLEX) 2 MG tablet Take 2 mg by mouth 2 (two) times daily. 02/12/21   [provider]  vitamin B-12 (CYANOCOBALAMIN) 1000 MCG tablet Take 1,000 mcg by mouth daily.    [provider]    Allergies    Formaldehyde and Latex  Review of Systems   Review of Systems  All other systems reviewed and are  negative.  Physical Exam Updated Vital Signs BP (!) 143/73   Pulse 94   Temp 98.6 F (37 C) (Oral)   Resp 18   Ht 5\' 6"  (1.676 m)   Wt 115.2 kg   SpO2 93%   BMI 41.00 kg/m   Physical Exam Vitals and nursing note reviewed.  Constitutional:      General: She is not in acute distress.    Appearance: She is well-developed. She is not diaphoretic.  HENT:     Head: Normocephalic and atraumatic.  Cardiovascular:     Rate and Rhythm: Normal rate and regular rhythm.     Heart sounds: No murmur heard.   No friction rub. No gallop.  Pulmonary:     Effort: Pulmonary effort is normal. No respiratory distress.     Breath sounds: Normal breath sounds. No wheezing.  Abdominal:     General: Bowel sounds are normal. There is no distension.     Palpations: Abdomen is soft.     Tenderness: There is abdominal tenderness. There is no right CVA tenderness, left CVA tenderness, guarding or rebound.     Comments: There is mild generalized tenderness, but no rebound or guarding.  Musculoskeletal:        General: Normal range of motion.     Cervical back: Normal range of motion and neck supple.  Skin:    General: Skin is warm and dry.  Neurological:     General: No focal deficit present.     Mental Status: She is alert and oriented to person, place, and time.    ED Results / Procedures / Treatments   Labs (all labs ordered are listed, but only abnormal results are displayed) Labs Reviewed - No data to display  EKG None  Radiology No results found.  Procedures Procedures   Medications Ordered in ED Medications  sodium chloride 0.9 % bolus 500 mL (has no administration in time range)  acetaminophen (TYLENOL) tablet 1,000 mg (has no administration in time range)  ED Course  I have reviewed the triage vital signs and the nursing notes.  Pertinent labs & imaging results that were available during my care of the patient were reviewed by me and considered in my medical decision  making (see chart for details).    MDM Rules/Calculators/A&P  Patient sent from her extended care facility over concerns of bloody emesis.  Patient vomited and had loose stool.  Her vomit was said to have been dark in color, but I am told she had a brownie just before vomiting.  Patient's abdomen is benign and CT scan is unremarkable.  Her initial hemoglobin was 14.1, then repeated and was 13.3.  Her stool is brown in color but is Hemoccult positive.    She has been observed here for nearly 4 hours and has had no more dark emesis and no melena.  Her hemoglobin is essentially stable and I suspect the slight drop was related to the hydration she received in the ER.  At this point, I feel as though patient can safely be discharged back to her extended care facility.  I suspect the reported coffee-ground emesis was likely her vomiting the brownie she ate earlier in the evening.  Final Clinical Impression(s) / ED Diagnoses Final diagnoses:  None    Rx / DC Orders ED Discharge Orders     None        Veryl Speak, MD 07/10/21 [phone]

## 2021-07-10 NOTE — ED Notes (Signed)
Notified receiving nurse at facility about new order for carafate on discharge.

## 2021-07-10 NOTE — ED Notes (Signed)
Pt to CT scan via stretcher by tech.

## 2021-07-10 NOTE — ED Notes (Signed)
Pt cleaned of feces. Brief and purwick placed at this time.

## 2021-07-10 NOTE — ED Notes (Signed)
Pt returned from CT scan.

## 2021-07-10 NOTE — Discharge Instructions (Signed)
Continue medications as previously prescribed.  Return to the emergency department if you develop black or tarry stools, severe abdominal pain, high fever, or other new and concerning symptoms.

## 2021-07-10 NOTE — ED Triage Notes (Signed)
Per facility pt vomited blood once tonight. Pt states she ate a brownie at dinner.

## 2021-07-10 NOTE — ED Notes (Signed)
Report called to [facility]. RN reported pt had bright red emesis and that was why she sent. MD notified.

## 2021-07-11 LAB — TYPE AND SCREEN
ABO/RH(D): O POS
Antibody Screen: POSITIVE

## 2022-07-29 ENCOUNTER — Emergency Department (HOSPITAL_COMMUNITY): Payer: Medicare (Managed Care)

## 2022-07-29 ENCOUNTER — Observation Stay (HOSPITAL_COMMUNITY)
Admission: EM | Admit: 2022-07-29 | Discharge: 2022-07-30 | Disposition: A | Discharge status: Skilled Nursing Facility | Payer: Medicare (Managed Care) | Attending: Student | Admitting: Student

## 2022-07-29 ENCOUNTER — Encounter (HOSPITAL_COMMUNITY): Payer: Self-pay

## 2022-07-29 ENCOUNTER — Other Ambulatory Visit: Payer: Self-pay

## 2022-07-29 DIAGNOSIS — Z9189 Other specified personal risk factors, not elsewhere classified: Secondary | ICD-10-CM

## 2022-07-29 DIAGNOSIS — R4701 Aphasia: Secondary | ICD-10-CM | POA: Diagnosis present

## 2022-07-29 DIAGNOSIS — G8194 Hemiplegia, unspecified affecting left nondominant side: Secondary | ICD-10-CM

## 2022-07-29 DIAGNOSIS — Z8616 Personal history of COVID-19: Secondary | ICD-10-CM | POA: Diagnosis not present

## 2022-07-29 DIAGNOSIS — Z91040 Latex allergy status: Secondary | ICD-10-CM | POA: Insufficient documentation

## 2022-07-29 DIAGNOSIS — F32A Depression, unspecified: Secondary | ICD-10-CM | POA: Diagnosis present

## 2022-07-29 DIAGNOSIS — K219 Gastro-esophageal reflux disease without esophagitis: Secondary | ICD-10-CM | POA: Diagnosis present

## 2022-07-29 DIAGNOSIS — E876 Hypokalemia: Secondary | ICD-10-CM | POA: Diagnosis not present

## 2022-07-29 DIAGNOSIS — R29810 Facial weakness: Secondary | ICD-10-CM | POA: Diagnosis not present

## 2022-07-29 DIAGNOSIS — I110 Hypertensive heart disease with heart failure: Secondary | ICD-10-CM | POA: Diagnosis not present

## 2022-07-29 DIAGNOSIS — E039 Hypothyroidism, unspecified: Secondary | ICD-10-CM | POA: Diagnosis present

## 2022-07-29 DIAGNOSIS — I482 Chronic atrial fibrillation, unspecified: Secondary | ICD-10-CM

## 2022-07-29 DIAGNOSIS — Z8673 Personal history of transient ischemic attack (TIA), and cerebral infarction without residual deficits: Secondary | ICD-10-CM

## 2022-07-29 DIAGNOSIS — Z87891 Personal history of nicotine dependence: Secondary | ICD-10-CM | POA: Diagnosis not present

## 2022-07-29 DIAGNOSIS — Z7901 Long term (current) use of anticoagulants: Secondary | ICD-10-CM | POA: Insufficient documentation

## 2022-07-29 DIAGNOSIS — I509 Heart failure, unspecified: Secondary | ICD-10-CM | POA: Insufficient documentation

## 2022-07-29 DIAGNOSIS — I2510 Atherosclerotic heart disease of native coronary artery without angina pectoris: Secondary | ICD-10-CM | POA: Diagnosis not present

## 2022-07-29 DIAGNOSIS — I480 Paroxysmal atrial fibrillation: Secondary | ICD-10-CM | POA: Diagnosis not present

## 2022-07-29 DIAGNOSIS — J449 Chronic obstructive pulmonary disease, unspecified: Secondary | ICD-10-CM | POA: Diagnosis not present

## 2022-07-29 DIAGNOSIS — Z951 Presence of aortocoronary bypass graft: Secondary | ICD-10-CM | POA: Diagnosis not present

## 2022-07-29 DIAGNOSIS — R5381 Other malaise: Secondary | ICD-10-CM

## 2022-07-29 DIAGNOSIS — R2990 Unspecified symptoms and signs involving the nervous system: Secondary | ICD-10-CM

## 2022-07-29 DIAGNOSIS — R479 Unspecified speech disturbances: Secondary | ICD-10-CM | POA: Diagnosis not present

## 2022-07-29 DIAGNOSIS — Z79899 Other long term (current) drug therapy: Secondary | ICD-10-CM | POA: Insufficient documentation

## 2022-07-29 DIAGNOSIS — F419 Anxiety disorder, unspecified: Secondary | ICD-10-CM | POA: Diagnosis present

## 2022-07-29 DIAGNOSIS — E785 Hyperlipidemia, unspecified: Secondary | ICD-10-CM | POA: Diagnosis present

## 2022-07-29 LAB — PROTIME-INR
INR: 1.2 (ref 0.8–1.2)
Prothrombin Time: 14.6 seconds (ref 11.4–15.2)

## 2022-07-29 LAB — CBC
HCT: 43.8 % (ref 36.0–46.0)
Hemoglobin: 13.5 g/dL (ref 12.0–15.0)
MCH: 28.8 pg (ref 26.0–34.0)
MCHC: 30.8 g/dL (ref 30.0–36.0)
MCV: 93.4 fL (ref 80.0–100.0)
Platelets: 185 10*3/uL (ref 150–400)
RBC: 4.69 MIL/uL (ref 3.87–5.11)
RDW: 15.6 % — ABNORMAL HIGH (ref 11.5–15.5)
WBC: 8.1 10*3/uL (ref 4.0–10.5)
nRBC: 0 % (ref 0.0–0.2)

## 2022-07-29 LAB — DIFFERENTIAL
Abs Immature Granulocytes: 0.02 10*3/uL (ref 0.00–0.07)
Basophils Absolute: 0 10*3/uL (ref 0.0–0.1)
Basophils Relative: 0 %
Eosinophils Absolute: 0.1 10*3/uL (ref 0.0–0.5)
Eosinophils Relative: 2 %
Immature Granulocytes: 0 %
Lymphocytes Relative: 23 %
Lymphs Abs: 1.9 10*3/uL (ref 0.7–4.0)
Monocytes Absolute: 0.5 10*3/uL (ref 0.1–1.0)
Monocytes Relative: 6 %
Neutro Abs: 5.6 10*3/uL (ref 1.7–7.7)
Neutrophils Relative %: 69 %

## 2022-07-29 LAB — APTT: aPTT: 34 seconds (ref 24–36)

## 2022-07-29 LAB — CBG MONITORING, ED: Glucose-Capillary: 184 mg/dL — ABNORMAL HIGH (ref 70–99)

## 2022-07-29 MED ORDER — IOHEXOL 350 MG/ML SOLN
75.0000 mL | Freq: Once | INTRAVENOUS | Status: AC | PRN
  Administered 2022-07-29: 75 mL via INTRAVENOUS

## 2022-07-29 NOTE — ED Notes (Signed)
[place] paed @ 2328

## 2022-07-29 NOTE — ED Notes (Signed)
ED Provider at bedside. 

## 2022-07-29 NOTE — ED Triage Notes (Addendum)
Pt BIB [facility] from [facility]. EMS states facility reported that pt started feeling "bad" around 1900 tonight and noticed she had some slurred speech. Pt does have left sided facial droop upon arrival, EMS states that was not present when they arrived at facility. Pt has hx of strokes in past and left sided weakness. Pt A&O x4.

## 2022-07-29 NOTE — ED Provider Notes (Signed)
[facility] Emergency Department Provider Note MRN:  [id number]  Arrival date & time: 07/30/22     Chief Complaint   Aphasia   History of Present Illness   Karen Mccann is a 71 y.o. year-old female with a history of stroke presenting to the ED with chief complaint of aphasia.  Patient went to sleep yesterday evening/early morning at 4 AM.  At that time her speech was normal.  She woke today with abnormal speech.  On and off all day.  Trouble forming words.  Also with new left facial droop today, unclear when this started.  Has left-sided hemiplegia from prior stroke.  Has not been sick recently, normal eating and drinking.  Review of Systems  A thorough review of systems was obtained and all systems are negative except as noted in the HPI and PMH.   Patient's Health History    Past Medical History:  Diagnosis Date   Anxiety    Atherosclerotic heart disease of native coronary artery without angina pectoris    Bipolar 1 disorder, depressed (HCC)    Chronic pain syndrome    COPD (chronic obstructive pulmonary disease) ([place])    COVID-19    Depression    Essential (primary) hypertension    Gastroesophageal reflux    GERD without esophagitis    Heart failure (HCC)    Heart failure, unspecified (HCC)    Hemiparesis (HCC)    Hemiplegia (HCC)    Herpesviral infection, unspecified    Hyperlipidemia    Hypothyroidism    Iron deficiency anemia    Long term (current) use of anticoagulants    Major depressive disorder    Opioid dependence, uncomplicated (HCC)    Other disorders of plasma-protein metabolism, not elsewhere classified    Paraplegia, unspecified ([place])    Personal history of other diseases of the circulatory system    Phonological disorder    Presence of aortocoronary bypass graft    Seizures ([place])    Stroke ([place])    Unspecified convulsions ([place])    Vitamin D deficiency     Past Surgical History:  Procedure Laterality Date   CARDIAC  SURGERY     cabg   COLONOSCOPY WITH PROPOFOL N/A 05/14/2018   Procedure: COLONOSCOPY WITH PROPOFOL;  Surgeon: Rogene Houston, MD;  [facility];  Service: Endoscopy;  Laterality: N/A;   ESOPHAGOGASTRODUODENOSCOPY (EGD) WITH PROPOFOL N/A 05/14/2018   Procedure: ESOPHAGOGASTRODUODENOSCOPY (EGD) WITH PROPOFOL;  Surgeon: Rogene Houston, MD;  [facility];  Service: Endoscopy;  Laterality: N/A;   POLYPECTOMY  05/14/2018   Procedure: POLYPECTOMY;  Surgeon: Rogene Houston, MD;  [facility];  Service: Endoscopy;;  cecal polyp   THYROID SURGERY     71 yrs old    Family History  Problem Relation Age of Onset   Hypertension Other    Ovarian cancer Daughter    Ovarian cancer Mother    Breast cancer Mother    Liver disease Neg Hx    Colon cancer Neg Hx     Social History   Socioeconomic History   Marital status: Divorced    Spouse name: Not on file   Number of children: Not on file   Years of education: Not on file   Highest education level: Not on file  Occupational History   Not on file  Tobacco Use   Smoking status: Former    Packs/day: 0.50    Types: Cigarettes    Quit date: 05/16/2003  Years since quitting: 19.2   Smokeless tobacco: Never  Vaping Use   Vaping Use: Never used  Substance and Sexual Activity   Alcohol use: No    Alcohol/week: 0.0 standard drinks of alcohol   Drug use: Never   Sexual activity: Never  Other Topics Concern   Not on file  Social History Narrative   Not on file   Social Determinants of Health   Financial Resource Strain: Not on file  Food Insecurity: Not on file  Transportation Needs: Not on file  Physical Activity: Not on file  Stress: Not on file  Social Connections: Not on file  Intimate Partner Violence: Not on file     Physical Exam   Vitals:   07/30/22 0400 07/30/22 0415  BP: 136/62 134/63  Pulse: 74 73  Resp: 17 17  Temp:    SpO2: 96% 94%    CONSTITUTIONAL: Chronically ill-appearing,  NAD NEURO/PSYCH:  Alert and oriented x 3, left hemiplegia EYES:  eyes equal and reactive ENT/NECK:  no LAD, no JVD CARDIO: Regular rate, well-perfused, normal S1 and S2 PULM:  CTAB no wheezing or rhonchi GI/GU:  non-distended, non-tender MSK/SPINE:  No gross deformities, no edema SKIN:  no rash, atraumatic   *Additional and/or pertinent findings included in MDM below  Diagnostic and Interventional Summary    EKG Interpretation  Date/Time:  Monday July 29 2022 23:27:26 EDT Ventricular Rate:  83 PR Interval:  132 QRS Duration: 106 QT Interval:  407 QTC Calculation: 479 R Axis:   24 Text Interpretation: Sinus rhythm Abnormal R-wave progression, early transition Consider inferior infarct Confirmed by Gerlene Fee [id number]) on 07/30/2022 12:37:32 AM       Labs Reviewed  CBC - Abnormal; Notable for the following components:      Result Value   RDW 15.6 (*)    All other components within normal limits  COMPREHENSIVE METABOLIC PANEL - Abnormal; Notable for the following components:   Potassium 3.4 (*)    Glucose, Bld 180 (*)    Calcium 8.2 (*)    Albumin 3.1 (*)    All other components within normal limits  LIPID PANEL - Abnormal; Notable for the following components:   HDL 28 (*)    All other components within normal limits  COMPREHENSIVE METABOLIC PANEL - Abnormal; Notable for the following components:   Calcium 8.7 (*)    Albumin 3.1 (*)    All other components within normal limits  CBC - Abnormal; Notable for the following components:   RDW 15.8 (*)    All other components within normal limits  CBG MONITORING, ED - Abnormal; Notable for the following components:   Glucose-Capillary 184 (*)    All other components within normal limits  ETHANOL  PROTIME-INR  APTT  DIFFERENTIAL  TSH  RAPID URINE DRUG SCREEN, HOSP PERFORMED  URINALYSIS, ROUTINE W REFLEX MICROSCOPIC  HIV ANTIBODY (ROUTINE TESTING W REFLEX)  HEMOGLOBIN A1C    CT ANGIO HEAD NECK W WO CM  Final  Result    CT HEAD CODE STROKE WO CONTRAST  Final Result    MR BRAIN WO CONTRAST    (Results Pending)    Medications  isosorbide dinitrate (ISORDIL) tablet 30 mg (has no administration in time range)  rosuvastatin (CRESTOR) tablet 5 mg (has no administration in time range)  DULoxetine (CYMBALTA) DR capsule 30 mg (has no administration in time range)  escitalopram (LEXAPRO) tablet 20 mg (has no administration in time range)  LORazepam (ATIVAN) tablet 0.5 mg (has  no administration in time range)  levothyroxine (SYNTHROID) tablet 137 mcg (has no administration in time range)  pantoprazole (PROTONIX) EC tablet 40 mg (has no administration in time range)  sucralfate (CARAFATE) tablet 1 g (has no administration in time range)  cyanocobalamin (VITAMIN B12) tablet 1,000 mcg (has no administration in time range)  iron polysaccharides (NIFEREX) capsule 150 mg (has no administration in time range)  pregabalin (LYRICA) capsule 150 mg (has no administration in time range)   stroke: early stages of recovery book (has no administration in time range)  acetaminophen (TYLENOL) tablet 650 mg (has no administration in time range)    Or  acetaminophen (TYLENOL) 160 MG/5ML solution 650 mg (has no administration in time range)    Or  acetaminophen (TYLENOL) suppository 650 mg (has no administration in time range)  senna-docusate (Senokot-S) tablet 1 tablet (has no administration in time range)  apixaban (ELIQUIS) tablet 2.5 mg (2.5 mg Oral Given 07/30/22 0206)  potassium chloride (KLOR-CON) packet 40 mEq (has no administration in time range)  iohexol (OMNIPAQUE) 350 MG/ML injection 75 mL (75 mLs Intravenous Contrast Given 07/29/22 2343)     Procedures  /  Critical Care .Critical Care  Performed by: Maudie Flakes, MD Authorized by: Maudie Flakes, MD   Critical care provider statement:    Critical care time (minutes):  35   Critical care was necessary to treat or prevent imminent or life-threatening  deterioration of the following conditions: Concern for acute ischemic stroke, initiation of code stroke protocol.   Critical care was time spent personally by me on the following activities:  Development of treatment plan with patient or surrogate, discussions with consultants, evaluation of patient's response to treatment, examination of patient, ordering and review of laboratory studies, ordering and review of radiographic studies, ordering and performing treatments and interventions, pulse oximetry, re-evaluation of patient's condition and review of old charts   ED Course and Medical Decision Making  Initial Impression and Ddx Differential diagnosis includes acute ischemic stroke, hemorrhagic stroke, stroke reactivation from secondary causes such as dehydration, infection, electrolyte disturbance.  Given the concern for aphasia or speech disturbance and it being less than 24 hours from last known normal/baseline, code stroke initiated.  Past medical/surgical history that increases complexity of ED encounter: History of large prior stroke  Interpretation of Diagnostics I personally reviewed the laboratory assessment and my interpretation is as follows: No significant blood count or electrolyte disturbance  CT imaging without obvious new stroke.  Patient Reassessment and Ultimate Disposition/Management     Teleneurology recommending admission, MRI.  Patient management required discussion with the following services or consulting groups:  Hospitalist Service and Neurology  Complexity of Problems Addressed Acute illness or injury that poses threat of life of bodily function  Additional Data Reviewed and Analyzed Further history obtained from: Prior labs/imaging results  Additional Factors Impacting ED Encounter Risk Consideration of hospitalization  Barth Kirks. Sedonia Small, [place] [email or web address]  Final Clinical Impressions(s) /  ED Diagnoses     ICD-10-CM   1. Speech disturbance, unspecified type  R47.9     2. Facial droop  R29.810       ED Discharge Orders     None        Discharge Instructions Discussed with and Provided to Patient:   Discharge Instructions   None      Maudie Flakes, MD 07/30/22 [phone]

## 2022-07-29 NOTE — ED Notes (Signed)
Lab collected blood during triage.

## 2022-07-30 ENCOUNTER — Other Ambulatory Visit (HOSPITAL_COMMUNITY): Payer: Medicare (Managed Care)

## 2022-07-30 ENCOUNTER — Observation Stay (HOSPITAL_COMMUNITY): Payer: Medicare (Managed Care)

## 2022-07-30 ENCOUNTER — Encounter (HOSPITAL_COMMUNITY): Payer: Self-pay | Admitting: Family Medicine

## 2022-07-30 DIAGNOSIS — F32A Depression, unspecified: Secondary | ICD-10-CM

## 2022-07-30 DIAGNOSIS — R2990 Unspecified symptoms and signs involving the nervous system: Secondary | ICD-10-CM

## 2022-07-30 DIAGNOSIS — G8194 Hemiplegia, unspecified affecting left nondominant side: Secondary | ICD-10-CM

## 2022-07-30 DIAGNOSIS — Z9189 Other specified personal risk factors, not elsewhere classified: Secondary | ICD-10-CM

## 2022-07-30 DIAGNOSIS — F419 Anxiety disorder, unspecified: Secondary | ICD-10-CM

## 2022-07-30 DIAGNOSIS — I2510 Atherosclerotic heart disease of native coronary artery without angina pectoris: Secondary | ICD-10-CM

## 2022-07-30 DIAGNOSIS — Z8673 Personal history of transient ischemic attack (TIA), and cerebral infarction without residual deficits: Secondary | ICD-10-CM

## 2022-07-30 DIAGNOSIS — E039 Hypothyroidism, unspecified: Secondary | ICD-10-CM

## 2022-07-30 DIAGNOSIS — R5381 Other malaise: Secondary | ICD-10-CM

## 2022-07-30 DIAGNOSIS — J438 Other emphysema: Secondary | ICD-10-CM

## 2022-07-30 DIAGNOSIS — R4701 Aphasia: Secondary | ICD-10-CM | POA: Diagnosis not present

## 2022-07-30 DIAGNOSIS — E782 Mixed hyperlipidemia: Secondary | ICD-10-CM

## 2022-07-30 DIAGNOSIS — E876 Hypokalemia: Secondary | ICD-10-CM

## 2022-07-30 DIAGNOSIS — I482 Chronic atrial fibrillation, unspecified: Secondary | ICD-10-CM | POA: Diagnosis not present

## 2022-07-30 DIAGNOSIS — K219 Gastro-esophageal reflux disease without esophagitis: Secondary | ICD-10-CM

## 2022-07-30 LAB — CBC
HCT: 43.2 % (ref 36.0–46.0)
Hemoglobin: 13.4 g/dL (ref 12.0–15.0)
MCH: 29.1 pg (ref 26.0–34.0)
MCHC: 31 g/dL (ref 30.0–36.0)
MCV: 93.7 fL (ref 80.0–100.0)
Platelets: 174 10*3/uL (ref 150–400)
RBC: 4.61 MIL/uL (ref 3.87–5.11)
RDW: 15.8 % — ABNORMAL HIGH (ref 11.5–15.5)
WBC: 8.2 10*3/uL (ref 4.0–10.5)
nRBC: 0 % (ref 0.0–0.2)

## 2022-07-30 LAB — LIPID PANEL
Cholesterol: 93 mg/dL (ref 0–200)
HDL: 28 mg/dL — ABNORMAL LOW (ref >40)
LDL Cholesterol: 47 mg/dL (ref 0–99)
Total CHOL/HDL Ratio: 3.3 RATIO
Triglycerides: 89 mg/dL (ref <150)
VLDL: 18 mg/dL (ref 0–40)

## 2022-07-30 LAB — COMPREHENSIVE METABOLIC PANEL
ALT: 18 U/L (ref 0–44)
ALT: 18 U/L (ref 0–44)
AST: 24 U/L (ref 15–41)
AST: 22 U/L (ref 15–41)
Albumin: 3.1 g/dL — ABNORMAL LOW (ref 3.5–5.0)
Albumin: 3.1 g/dL — ABNORMAL LOW (ref 3.5–5.0)
Alkaline Phosphatase: 106 U/L (ref 38–126)
Alkaline Phosphatase: 105 U/L (ref 38–126)
Anion gap: 6 (ref 5–15)
Anion gap: 8 (ref 5–15)
BUN: 11 mg/dL (ref 8–23)
BUN: 10 mg/dL (ref 8–23)
CO2: 29 mmol/L (ref 22–32)
CO2: 28 mmol/L (ref 22–32)
Calcium: 8.2 mg/dL — ABNORMAL LOW (ref 8.9–10.3)
Calcium: 8.7 mg/dL — ABNORMAL LOW (ref 8.9–10.3)
Chloride: 106 mmol/L (ref 98–111)
Chloride: 108 mmol/L (ref 98–111)
Creatinine, Ser: 0.81 mg/dL (ref 0.44–1.00)
Creatinine, Ser: 0.61 mg/dL (ref 0.44–1.00)
GFR, Estimated: >60 mL/min (ref >60)
GFR, Estimated: >60 mL/min (ref >60)
Glucose, Bld: 180 mg/dL — ABNORMAL HIGH (ref 70–99)
Glucose, Bld: 97 mg/dL (ref 70–99)
Potassium: 3.4 mmol/L — ABNORMAL LOW (ref 3.5–5.1)
Potassium: 3.6 mmol/L (ref 3.5–5.1)
Sodium: 141 mmol/L (ref 135–145)
Sodium: 144 mmol/L (ref 135–145)
Total Bilirubin: 0.4 mg/dL (ref 0.3–1.2)
Total Bilirubin: 0.5 mg/dL (ref 0.3–1.2)
Total Protein: 6.7 g/dL (ref 6.5–8.1)
Total Protein: 6.7 g/dL (ref 6.5–8.1)

## 2022-07-30 LAB — HEMOGLOBIN A1C
Hgb A1c MFr Bld: 4.9 % (ref 4.8–5.6)
Mean Plasma Glucose: 93.93 mg/dL

## 2022-07-30 LAB — MAGNESIUM: Magnesium: 2.1 mg/dL (ref 1.7–2.4)

## 2022-07-30 LAB — GLUCOSE, CAPILLARY: Glucose-Capillary: 90 mg/dL (ref 70–99)

## 2022-07-30 LAB — TSH: TSH: 2.205 u[IU]/mL (ref 0.350–4.500)

## 2022-07-30 LAB — HIV ANTIBODY (ROUTINE TESTING W REFLEX): HIV Screen 4th Generation wRfx: NONREACTIVE

## 2022-07-30 LAB — ETHANOL: Alcohol, Ethyl (B): <10 mg/dL (ref <10)

## 2022-07-30 MED ORDER — LORAZEPAM 0.5 MG PO TABS: 0.5000 mg | ORAL_TABLET | Freq: Every day | ORAL | Status: DC

## 2022-07-30 MED ORDER — PREGABALIN 75 MG PO CAPS
150.0000 mg | ORAL_CAPSULE | Freq: Two times a day (BID) | ORAL | Status: DC
  Administered 2022-07-30: 150 mg via ORAL
  Filled 2022-07-30: qty 2

## 2022-07-30 MED ORDER — ACETAMINOPHEN 650 MG RE SUPP: 650.0000 mg | RECTAL | Status: DC | PRN

## 2022-07-30 MED ORDER — STROKE: EARLY STAGES OF RECOVERY BOOK
Freq: Once | Status: DC
  Filled 2022-07-30: qty 1

## 2022-07-30 MED ORDER — POLYSACCHARIDE IRON COMPLEX 150 MG PO CAPS
150.0000 mg | ORAL_CAPSULE | Freq: Two times a day (BID) | ORAL | Status: DC
  Administered 2022-07-30: 150 mg via ORAL
  Filled 2022-07-30: qty 1

## 2022-07-30 MED ORDER — APIXABAN 2.5 MG PO TABS
2.5000 mg | ORAL_TABLET | Freq: Two times a day (BID) | ORAL | Status: DC
  Administered 2022-07-30 (×2): 2.5 mg via ORAL
  Filled 2022-07-30 (×2): qty 1

## 2022-07-30 MED ORDER — VITAMIN B-12 1000 MCG PO TABS
1000.0000 ug | ORAL_TABLET | Freq: Every day | ORAL | Status: DC
  Administered 2022-07-30: 1000 ug via ORAL
  Filled 2022-07-30: qty 1

## 2022-07-30 MED ORDER — POTASSIUM CHLORIDE CRYS ER 20 MEQ PO TBCR
40.0000 meq | EXTENDED_RELEASE_TABLET | Freq: Once | ORAL | Status: AC
  Administered 2022-07-30: 40 meq via ORAL
  Filled 2022-07-30: qty 2

## 2022-07-30 MED ORDER — PANTOPRAZOLE SODIUM 40 MG PO TBEC
40.0000 mg | DELAYED_RELEASE_TABLET | Freq: Every day | ORAL | Status: DC
  Administered 2022-07-30: 40 mg via ORAL
  Filled 2022-07-30: qty 1

## 2022-07-30 MED ORDER — ROSUVASTATIN CALCIUM 10 MG PO TABS
5.0000 mg | ORAL_TABLET | Freq: Every day | ORAL | Status: DC
  Administered 2022-07-30: 5 mg via ORAL
  Filled 2022-07-30: qty 1

## 2022-07-30 MED ORDER — ISOSORBIDE DINITRATE 30 MG PO TABS
30.0000 mg | ORAL_TABLET | Freq: Every day | ORAL | Status: DC
  Administered 2022-07-30: 30 mg via ORAL
  Filled 2022-07-30 (×2): qty 1

## 2022-07-30 MED ORDER — SENNOSIDES-DOCUSATE SODIUM 8.6-50 MG PO TABS: 1.0000 | ORAL_TABLET | Freq: Every evening | ORAL | Status: DC | PRN

## 2022-07-30 MED ORDER — POTASSIUM CHLORIDE 20 MEQ PO PACK: 40.0000 meq | PACK | Freq: Once | ORAL | Status: DC

## 2022-07-30 MED ORDER — ACETAMINOPHEN 325 MG PO TABS: 650.0000 mg | ORAL_TABLET | ORAL | Status: DC | PRN

## 2022-07-30 MED ORDER — ACETAMINOPHEN 160 MG/5ML PO SOLN: 650.0000 mg | ORAL | Status: DC | PRN

## 2022-07-30 MED ORDER — SUCRALFATE 1 G PO TABS
1.0000 g | ORAL_TABLET | Freq: Three times a day (TID) | ORAL | Status: DC
  Administered 2022-07-30: 1 g via ORAL
  Filled 2022-07-30: qty 1

## 2022-07-30 MED ORDER — ESCITALOPRAM OXALATE 10 MG PO TABS
20.0000 mg | ORAL_TABLET | Freq: Every day | ORAL | Status: DC
  Administered 2022-07-30: 20 mg via ORAL
  Filled 2022-07-30: qty 2

## 2022-07-30 MED ORDER — DULOXETINE HCL 30 MG PO CPEP: 30.0000 mg | ORAL_CAPSULE | Freq: Every day | ORAL | 3 refills | Status: AC

## 2022-07-30 MED ORDER — DULOXETINE HCL 30 MG PO CPEP
30.0000 mg | ORAL_CAPSULE | Freq: Every day | ORAL | Status: DC
  Administered 2022-07-30: 30 mg via ORAL
  Filled 2022-07-30: qty 1

## 2022-07-30 MED ORDER — APIXABAN 5 MG PO TABS: 5.0000 mg | ORAL_TABLET | Freq: Two times a day (BID) | ORAL | 2 refills | Status: AC

## 2022-07-30 MED ORDER — LEVOTHYROXINE SODIUM 137 MCG PO TABS
137.0000 ug | ORAL_TABLET | Freq: Every day | ORAL | Status: DC
  Administered 2022-07-30: 137 ug via ORAL
  Filled 2022-07-30: qty 1

## 2022-07-30 MED ORDER — VITAMIN B-12 1000 MCG PO TABS: 1000.0000 ug | ORAL_TABLET | Freq: Every day | ORAL | Status: AC

## 2022-07-30 NOTE — Assessment & Plan Note (Signed)
Continue Protonix °

## 2022-07-30 NOTE — Progress Notes (Signed)
Code stroke  Call Time  No call Beeper   2325 Start   2330 End   2334 [facility]   2334 Epic   2334 Rad called  2335

## 2022-07-30 NOTE — Assessment & Plan Note (Signed)
Continue Crestor 

## 2022-07-30 NOTE — Evaluation (Signed)
Speech Language Pathology Evaluation Patient Details Name: Karen Mccann MRN: [id number] DOB: Oct 21, 1950 Today's Date: 07/30/2022 Time: [id number] SLP Time Calculation (min) (ACUTE ONLY): 23 min  Problem List:  Patient Active Problem List   Diagnosis Date Noted   Aphasia 07/30/2022   Atrial fibrillation, chronic ([place]) 07/30/2022   Stroke-like symptoms 07/30/2022   History of CVA (cerebrovascular accident) 07/30/2022   Left hemiparesis ([place]) 07/30/2022   Debility 07/30/2022   At risk for polypharmacy 07/30/2022   GI bleed 03/07/2021   Nausea & vomiting 03/07/2021   Diarrhea 03/07/2021   Abdominal pain 03/07/2021   Leukocytosis 03/07/2021   Hyperglycemia 03/07/2021   Prolonged QT interval 03/07/2021   Nausea vomiting and diarrhea    Rectal bleeding    Acute on chronic anemia 10/19/2019   COVID-19 virus infection 10/19/2019   Hypokalemia 10/19/2019   Status epilepticus ([place]) 02/08/2019   Anemia 05/12/2018   Sepsis ([place]) 03/23/2016   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   Lower urinary tract infectious disease 09/17/2015   Stroke ([place]) 05/09/2015   Hypothyroid 05/09/2015   Bipolar 1 disorder ([place]) 05/09/2015   Seizures ([place]) 05/09/2015   Hyperlipidemia 05/09/2015   COPD (chronic obstructive pulmonary disease) ([place]) 05/09/2015   Depression 05/09/2015   Anxiety 05/09/2015   General weakness 05/09/2015   CAD (coronary artery disease) 05/09/2015   Hx of CABG 05/09/2015   HTN (hypertension) 05/09/2015   Chronic pain 05/09/2015   Esophageal reflux disease 05/09/2015   Other heart disorders in diseases classified elsewhere 05/09/2015   Paraplegia ([place]) 05/09/2015   Past Medical History:  Past Medical History:  Diagnosis Date   Anxiety    Atherosclerotic heart disease of native coronary artery without angina pectoris    Bipolar 1 disorder, depressed (HCC)    Chronic pain syndrome    COPD (chronic obstructive pulmonary disease) ([place])    COVID-19    Depression     Essential (primary) hypertension    Gastroesophageal reflux    GERD without esophagitis    Heart failure (HCC)    Heart failure, unspecified (HCC)    Hemiparesis (HCC)    Hemiplegia (HCC)    Herpesviral infection, unspecified    Hyperlipidemia    Hypothyroidism    Iron deficiency anemia    Long term (current) use of anticoagulants    Major depressive disorder    Opioid dependence, uncomplicated (HCC)    Other disorders of plasma-protein metabolism, not elsewhere classified    Paraplegia, unspecified ([place])    Personal history of other diseases of the circulatory system    Phonological disorder    Presence of aortocoronary bypass graft    Seizures ([place])    Stroke ([place])    Unspecified convulsions ([place])    Vitamin D deficiency    Past Surgical History:  Past Surgical History:  Procedure Laterality Date   CARDIAC SURGERY     cabg   COLONOSCOPY WITH PROPOFOL N/A 05/14/2018   Procedure: COLONOSCOPY WITH PROPOFOL;  Surgeon: Rogene Houston, MD;  [facility];  Service: Endoscopy;  Laterality: N/A;   ESOPHAGOGASTRODUODENOSCOPY (EGD) WITH PROPOFOL N/A 05/14/2018   Procedure: ESOPHAGOGASTRODUODENOSCOPY (EGD) WITH PROPOFOL;  Surgeon: Rogene Houston, MD;  [facility];  Service: Endoscopy;  Laterality: N/A;   POLYPECTOMY  05/14/2018   Procedure: POLYPECTOMY;  Surgeon: Rogene Houston, MD;  [facility]: [facility];  Service: Endoscopy;;  cecal polyp   THYROID SURGERY     71 yrs old   HPI:  Karen Mccann is  a 71 y.o. female with medical history significant of anxiety, CAD, bipolar 1 disorder, COPD, chronic pain, depression, hypertension, GERD, heart failure, left-sided hemiparesis/hemiplegia, hypothyroidism, hyperlipidemia, seizures, history of stroke, and more presents to the ED with a chief complaint of slurred speech and right-sided deficits; MRI head 10/17 indicated No acute finding.  2. Large remote right MCA distribution infarct; SLE generated   Assessment /  Plan / Recommendation Clinical Impression  Pt seen for speech/language cognitive assessment with no new impairments noted, but L inattention/memory recall persists from prior CVA with pt demonstrating decreased awareness without cueing from SLP and decreased problem solving during assessment.  The [place] Mental Status Examination (SLUMS) completed with a score obtained of 17/30 with memory/inattention impacting overall performance with various tasks such as simple calculation, clock formation, word fluency and object recall; pt initially exhibited dysarthria/aphasic symptoms, but these have resolved during simple conversation.  OME revealed slight L facial weakness at rest, but does not appear to affect speech/swallowing per pt report/clinician observation.  Recommend ST s/o in this setting d/t pt functioning at baseline level per chart review/observations and new symptoms resolved during this assessment.  No family available during the assessment to determine full baseline functioning.  If new impairments evident when pt returns to SNF, ST f/u may be beneficial.    SLP Assessment  SLP Recommendation/Assessment: Patient does not need any further Speech Language Pathology Services SLP Visit Diagnosis: Cognitive communication deficit (R41.841)    Recommendations for follow up therapy are one component of a multi-disciplinary discharge planning process, led by the attending physician.  Recommendations may be updated based on patient status, additional functional criteria and insurance authorization.    Follow Up Recommendations  Skilled nursing-short term rehab (<3 hours/day)    Assistance Recommended at Discharge  Frequent or constant Supervision/Assistance  Functional Status Assessment    Frequency and Duration  (evaluation only)         SLP Evaluation Cognition  Overall Cognitive Status: History of cognitive impairments - at baseline Arousal/Alertness: Awake/alert Orientation  Level: Oriented X4 Day of Week: Correct Attention: Sustained Sustained Attention: Impaired Sustained Attention Impairment: Verbal basic;Functional basic Memory: Impaired Memory Impairment: Retrieval deficit;Decreased recall of new information;Decreased short term memory Decreased Short Term Memory: Verbal basic;Functional basic Awareness: Impaired Awareness Impairment: Anticipatory impairment Safety/Judgment: Impaired Comments: L inattention; decreased awareness noted, but pt did indicate "I have a blind spot in my L eye when asked about clock formation task being skewed       Comprehension  Auditory Comprehension Overall Auditory Comprehension: Appears within functional limits for tasks assessed Commands: Not tested Conversation: Simple Interfering Components: Attention;Working Field seismologist: Dispensing optician Discrimination: Exceptions to [facility] L/R Discrimination: Able for objects in room Reading Comprehension Reading Status: Not tested    Expression Expression Primary Mode of Expression: Verbal Verbal Expression Overall Verbal Expression: Appears within functional limits for tasks assessed Initiation: No impairment Level of Generative/Spontaneous Verbalization: Conversation Naming: Not tested Interfering Components: Attention Non-Verbal Means of Communication: Not applicable Written Expression Dominant Hand: Right Written Expression: Exceptions to [facility] Interfering Components: Left neglect/inattention   Oral / Motor  Oral Motor/Sensory Function Overall Oral Motor/Sensory Function: Mild impairment Facial Symmetry: Abnormal symmetry left (at rest) Motor Speech Overall Motor Speech: Appears within functional limits for tasks assessed Respiration: Within functional limits Phonation: Normal Resonance: Within functional limits Articulation: Within functional limitis Intelligibility: Intelligible Motor Planning:  [place] functional limits Motor Speech Errors: Not applicable Interfering Components: Premorbid status  Elvina Sidle, M.S., [place] 07/30/2022, 1:47 PM

## 2022-07-30 NOTE — Discharge Summary (Signed)
Physician Discharge Summary  Karen Mccann OBS:[id number] DOB: 07/31/1951 DOA: 07/29/2022  PCP: Caprice Renshaw, MD  Admit date: 07/29/2022 Discharge date: 07/30/2022 Admitted From: SNF/LTC Disposition: SNF/LTC Recommendations for Outpatient Follow-up:  Follow up with PCP in 1 to 2 weeks Check blood pressure, BMP and CBC in 1 to 2 weeks Please follow up on the following pending results: None   Discharge Condition: Stable CODE STATUS: Full code   Hospital course 71 year old F with PMH of CAD, COPD, unknown type heart failure, paroxysmal A-fib on Eliquis, CVA with residual left hemiparesis, seizure disorder, anxiety, depression, bipolar disorder, chronic pain on chronic opiate, hypothyroidism and debility presenting with slurred speech, right-sided weakness, headache, palpitation and chest pain while watching TV, and admitted for aphasia and strokelike symptoms.  Vital stable.  K3.4.  Glucose 180.  Otherwise, CMP within normal.  CBC with differential normal.  Teleneurology consulted and gave recommendation.  CT head, CTA head and neck and MRI brain without acute finding.  LDL 47.  A1c 4.9.  TSH 2.2.  EtOH level negative.  The next day, patient's symptoms improved other than left upper extremity pain.  TTE deferred.  She is discharged back to SNF/LTC.  We have increased her Eliquis from 2.5 to 5 mg.  Of note, patient is at risk for polypharmacy on opiate, benzo, Lyrica.  Recommend reassessing this.  Consider outpatient sleep study to rule out sleep apnea as well.  See individual problem list below for more.   Problems addressed during this hospitalization Principal Problem:   Aphasia Active Problems:   Hypothyroid   Hyperlipidemia   COPD (chronic obstructive pulmonary disease) (HCC)   Depression   Anxiety   CAD (coronary artery disease)   Esophageal reflux disease   Hypokalemia   Atrial fibrillation, chronic (HCC)   Stroke-like symptoms   History of CVA (cerebrovascular  accident)   Left hemiparesis ([place])   Debility   At risk for polypharmacy               Vital signs Vitals:   07/30/22 0745 07/30/22 0819 07/30/22 0823 07/30/22 0909  BP: 138/74  135/76 (!) 131/59  Pulse: 80  77 (!) 103  Temp:   97.7 F (36.5 C) 98.4 F (36.9 C)  Resp: '14  20 18  '$ Height:  '5\' 6"'$  (1.676 m)    Weight:  106.2 kg    SpO2:   92% 100%  TempSrc:   Oral   BMI (Calculated):  37.81       Discharge exam  GENERAL: No apparent distress.  Nontoxic. HEENT: MMM.  Vision and hearing grossly intact.  NECK: Supple.  No apparent JVD.  RESP:  No IWOB.  Fair aeration bilaterally but limited exam due to body habitus. CVS:  RRR. Heart sounds normal.  ABD/GI/GU: BS+. Abd soft, NTND.  MSK/EXT:  Moves extremities. No apparent deformity. No edema.  SKIN: no apparent skin lesion or wound NEURO: Awake and alert. Oriented appropriately.  Left hemiparesis. PSYCH: Calm. Normal affect.   Discharge Instructions Discharge Instructions     Diet - low sodium heart healthy   Complete by: As directed    Increase activity slowly   Complete by: As directed       Allergies as of 07/30/2022       Reactions   Formaldehyde    Unknown   Latex Other (See Comments)   unknown        Medication List     STOP taking these medications  Venlafaxine HCl 37.5 MG Tb24       TAKE these medications    acetaminophen 325 MG tablet Commonly known as: TYLENOL Take 650 mg by mouth every 8 (eight) hours as needed.   apixaban 5 MG Tabs tablet Commonly known as: ELIQUIS Take 1 tablet (5 mg total) by mouth 2 (two) times daily. What changed:  medication strength how much to take   ascorbic acid 500 MG tablet Commonly known as: VITAMIN C Take 500 mg by mouth 2 (two) times daily.   cyanocobalamin 1000 MCG tablet Commonly known as: VITAMIN B12 Take 1 tablet (1,000 mcg total) by mouth daily.   DULoxetine 30 MG capsule Commonly known as: CYMBALTA Take 1 capsule (30 mg total) by  mouth daily.   furosemide 20 MG tablet Commonly known as: LASIX Take 20 mg by mouth daily.   iron polysaccharides 150 MG capsule Commonly known as: NIFEREX Take 1 capsule (150 mg total) by mouth 2 (two) times daily.   isosorbide dinitrate 30 MG tablet Commonly known as: ISORDIL Take 10 mg by mouth daily.   levETIRAcetam 500 MG tablet Commonly known as: Keppra Take 1 tablet (500 mg total) by mouth 2 (two) times daily for 30 days.   levothyroxine 137 MCG tablet Commonly known as: SYNTHROID Take 150 mcg by mouth daily.   lidocaine 4 % Apply 1 patch topically See admin instructions. 12 hours on and 12 hours off   LORazepam 0.5 MG tablet Commonly known as: ATIVAN Take 1 tablet (0.5 mg total) by mouth at bedtime.   metoprolol tartrate 50 MG tablet Commonly known as: LOPRESSOR Take 1 tablet (50 mg total) by mouth 2 (two) times daily. What changed: how much to take   nitroGLYCERIN 0.4 MG SL tablet Commonly known as: NITROSTAT Place 0.4 mg under the tongue every 5 (five) minutes as needed for chest pain.   omeprazole 40 MG capsule Commonly known as: PRILOSEC Take 40 mg by mouth daily.   oxyCODONE-acetaminophen 5-325 MG tablet Commonly known as: PERCOCET/ROXICET Take 1 tablet by mouth every 12 (twelve) hours as needed for moderate pain or severe pain.   polyethylene glycol powder 17 GM/SCOOP powder Commonly known as: GLYCOLAX/MIRALAX Take 17 g by mouth in the morning and at bedtime.   pregabalin 150 MG capsule Commonly known as: LYRICA Take 100 mg by mouth 2 (two) times daily.   rosuvastatin 5 MG tablet Commonly known as: CRESTOR Take 5 mg by mouth daily.   senna 8.6 MG Tabs tablet Commonly known as: SENOKOT Take 1 tablet by mouth 2 (two) times daily.   sucralfate 1 g tablet Commonly known as: Carafate Take 1 tablet (1 g total) by mouth 4 (four) times daily -  with meals and at bedtime.        Consultations: Teleneurology  Procedures/Studies:   MR  BRAIN WO CONTRAST  Result Date: 07/30/2022 CLINICAL DATA:  Neuro deficit with stroke suspected EXAM: MRI HEAD WITHOUT CONTRAST TECHNIQUE: Multiplanar, multiecho pulse sequences of the brain and surrounding structures were obtained without intravenous contrast. COMPARISON:  Head CT from yesterday FINDINGS: Brain: No acute infarction, hemorrhage, hydrocephalus, extra-axial collection or mass lesion. Large remote right MCA distribution infarct with wallerian degeneration descending the brainstem. Small remote right cerebellar infarcts. Chronic blood products associated with the remote cerebral infarct. Vascular: Central flow voids are preserved Skull and upper cervical spine: Normal marrow signal. Sinuses/Orbits: Negative. Other: None. IMPRESSION: 1. No acute finding. 2. Large remote right MCA distribution infarct. Electronically Signed   By:  Jorje Guild M.D.   On: 07/30/2022 07:59   CT ANGIO HEAD NECK W WO CM  Result Date: 07/30/2022 CLINICAL DATA:  Initial evaluation for neuro deficit, stroke suspected. EXAM: CT ANGIOGRAPHY HEAD AND NECK TECHNIQUE: Multidetector CT imaging of the head and neck was performed using the standard protocol during bolus administration of intravenous contrast. Multiplanar CT image reconstructions and MIPs were obtained to evaluate the vascular anatomy. Carotid stenosis measurements (when applicable) are obtained utilizing NASCET criteria, using the distal internal carotid diameter as the denominator. RADIATION DOSE REDUCTION: This exam was performed according to the departmental dose-optimization program which includes automated exposure control, adjustment of the mA and/or kV according to patient size and/or use of iterative reconstruction technique. CONTRAST:  64m OMNIPAQUE IOHEXOL 350 MG/ML SOLN COMPARISON:  Head CT from earlier the same day. FINDINGS: CTA NECK FINDINGS Aortic arch: Visualized aortic arch normal in caliber with standard branch pattern. Mild atheromatous  change about the arch itself. No significant stenosis about the origin the great vessels. Sequelae of prior CABG partially visualized. Right carotid system: Right common and internal carotid arteries patent without dissection, stenosis or occlusion. Left carotid system: Left common internal carotid arteries are patent without dissection, stenosis, or occlusion. Vertebral arteries: Both vertebral arteries arise from subclavian arteries. No proximal subclavian artery stenosis. Left vertebral artery dominant. Vertebral arteries patent without stenosis, dissection or occlusion. Skeleton: No discrete or worrisome osseous lesions. Patient is edentulous. Moderate spondylosis present at C5-6. Other neck: No other acute soft tissue abnormality within the neck. 2.6 cm left thyroid nodule noted, indeterminate. Upper chest: Visualized upper chest demonstrates no acute finding. Review of the MIP images confirms the above findings CTA HEAD FINDINGS Anterior circulation: Both internal carotid arteries patent to the termini without stenosis. A1 segments patent bilaterally. Normal anterior communicating artery complex. Anterior cerebral arteries patent to their distal aspects without stenosis. Left M1 segment widely patent. No proximal left MCA branch occlusion or high-grade stenosis. Distal left MCA branches well perfused. Right M1 segment patent proximally, but increasingly narrows and becomes attenuated as it courses towards the right MCA bifurcation. Distal right MCA branches are markedly attenuated due to the large remote right MCA territory infarct. No proximal right MCA branch occlusion or definite high-grade stenosis. Posterior circulation: Both vertebral arteries patent without stenosis. Left vertebral artery dominant. Both PICA patent. Basilar patent to its distal aspect without stenosis. Superior cerebral arteries patent bilaterally. Left PCA primarily supplied via the basilar. Right PCA supplied via a hypoplastic right  P1 segment and robust right posterior communicating artery. Both PCAs patent to their distal aspects without hemodynamically significant stenosis. Venous sinuses: Patent allowing for timing the contrast bolus. Anatomic variants: None significant.  No aneurysm. Review of the MIP images confirms the above findings IMPRESSION: 1. Negative CTA for acute large vessel occlusion or other emergent finding. 2. Marked attenuation of the distal right MCA branches related to the chronic right MCA territory infarct. No proximal branch occlusion. 3. Mild for age atheromatous change elsewhere about the major arterial vasculature of the head and neck. No other high-grade or correctable stenosis. 4. 2.6 cm left thyroid nodule, indeterminate. Further evaluation with dedicated thyroid ultrasound recommended. This could be performed on a nonemergent outpatient basis. (Ref: J Am Coll Radiol. 2015 Feb;12(2): 143-50). Electronically Signed   By: BJeannine BogaM.D.   On: 07/30/2022 00:52   CT HEAD CODE STROKE WO CONTRAST  Result Date: 07/29/2022 CLINICAL DATA:  Code stroke. Head a this radiology: Appendix neck appear  he EXAM: CT HEAD WITHOUT CONTRAST TECHNIQUE: Contiguous axial images were obtained from the base of the skull through the vertex without intravenous contrast. RADIATION DOSE REDUCTION: This exam was performed according to the departmental dose-optimization program which includes automated exposure control, adjustment of the mA and/or kV according to patient size and/or use of iterative reconstruction technique. COMPARISON:  Prior study from 02/08/2019. FINDINGS: Brain: Large area of encephalomalacia involving the right cerebral hemisphere, consistent with a chronic right MCA distribution infarct, stable. No acute intracranial hemorrhage. No acute large vessel territory infarct. No mass lesion, midline shift or mass effect. Ex vacuo dilatation of the right lateral ventricle without hydrocephalus. No extra-axial  fluid collection. Vascular: No abnormal hyperdense vessel. Skull: Scalp soft tissues and calvarium within normal limits. Sinuses/Orbits: Globes and orbital soft tissues within normal limits. Paranasal sinuses and mastoid air cells are largely clear. Other: None. ASPECTS [facility] Stroke Program Early CT Score) - Ganglionic level infarction (caudate, lentiform nuclei, internal capsule, insula, M1-M3 cortex): 7 - Supraganglionic infarction (M4-M6 cortex): 3 Total score (0-10 with 10 being normal): 10 IMPRESSION: 1. No acute intracranial abnormality. 2. ASPECTS is 10. 3. Large remote right MCA territory infarct with mild chronic microvascular ischemic disease. Results were called by telephone at the time of interpretation on 07/29/2022 at 11:52 pm to provider [facility] , who verbally acknowledged these results. Electronically Signed   By: Jeannine Boga M.D.   On: 07/29/2022 23:56       The results of significant diagnostics from this hospitalization (including imaging, microbiology, ancillary and laboratory) are listed below for reference.     Microbiology: No results found for this or any previous visit (from the past 240 hour(s)).   Labs:  CBC: Recent Labs  Lab 07/29/22 2308 07/30/22 0557  WBC 8.1 8.2  NEUTROABS 5.6  --   HGB 13.5 13.4  HCT 43.8 43.2  MCV 93.4 93.7  PLT 185 174   BMP &GFR Recent Labs  Lab 07/29/22 2308 07/30/22 0557  NA 141 144  K 3.4* 3.6  CL 106 108  CO2 29 28  GLUCOSE 180* 97  BUN 11 10  CREATININE 0.81 0.61  CALCIUM 8.2* 8.7*  MG  --  2.1   Estimated Creatinine Clearance: 81.8 mL/min (by C-G formula based on SCr of 0.61 mg/dL). Liver & Pancreas: Recent Labs  Lab 07/29/22 2308 07/30/22 0557  AST 24 22  ALT 18 18  ALKPHOS 106 105  BILITOT 0.4 0.5  PROT 6.7 6.7  ALBUMIN 3.1* 3.1*   No results for input(s): "LIPASE", "AMYLASE" in the last 168 hours. No results for input(s): "AMMONIA" in the last 168 hours. Diabetic: Recent Labs     07/30/22 0557  HGBA1C 4.9   Recent Labs  Lab 07/29/22 2329 07/30/22 0823  GLUCAP 184* 90   Cardiac Enzymes: No results for input(s): "CKTOTAL", "CKMB", "CKMBINDEX", "TROPONINI" in the last 168 hours. No results for input(s): "PROBNP" in the last 8760 hours. Coagulation Profile: Recent Labs  Lab 07/29/22 2308  INR 1.2   Thyroid Function Tests: Recent Labs    07/29/22 2308  TSH 2.205   Lipid Profile: Recent Labs    07/30/22 0557  CHOL 93  HDL 28*  LDLCALC 47  TRIG 89  CHOLHDL 3.3   Anemia Panel: No results for input(s): "VITAMINB12", "FOLATE", "FERRITIN", "TIBC", "IRON", "RETICCTPCT" in the last 72 hours. Urine analysis:    Component Value Date/Time   COLORURINE YELLOW 02/08/2019 [place] 02/08/2019 0910  LABSPEC 1.020 02/08/2019 0910   PHURINE 5.5 02/08/2019 0910   GLUCOSEU NEGATIVE 02/08/2019 0910   HGBUR LARGE (A) 02/08/2019 0910   BILIRUBINUR NEGATIVE 02/08/2019 0910   KETONESUR NEGATIVE 02/08/2019 0910   PROTEINUR TRACE (A) 02/08/2019 0910   NITRITE POSITIVE (A) 02/08/2019 0910   LEUKOCYTESUR MODERATE (A) 02/08/2019 0910   Sepsis Labs: Invalid input(s): "PROCALCITONIN", "LACTICIDVEN"   SIGNED:  Mercy Riding, MD  [facility] 07/30/2022, 12:02 PM

## 2022-07-30 NOTE — Progress Notes (Signed)
PT Cancellation Note  Patient Details Name: Karen Mccann MRN: [id number] DOB: 1951-04-03   Cancelled Treatment:    Reason Eval/Treat Not Completed: PT screened, no needs identified, will sign off.  Patients at baseline, able to talk normally and bed bound at baseline with inability to move legs, mechanical lift for transfers at home.   11:02 AM, 07/30/22 Lonell Grandchild, MPT Physical Therapist with [facility] [phone] office [phone] mobile phone

## 2022-07-30 NOTE — H&P (Signed)
History and Physical    Patient: Karen Mccann BMW:[id number] DOB: 1951-05-30 DOA: 07/29/2022 DOS: the patient was seen and examined on 07/30/2022 PCP: Caprice Renshaw, MD  Patient coming from: [facility]  Chief Complaint:  Chief Complaint  Patient presents with   Aphasia   HPI: Karen Mccann is a 71 y.o. female with medical history significant of anxiety, CAD, bipolar 1 disorder, COPD, chronic pain, depression, hypertension, GERD, heart failure, left-sided hemiparesis/hemiplegia, hypothyroidism, hyperlipidemia, seizures, history of stroke, and more presents to the ED with a chief complaint of slurred speech and right-sided deficits.  Patient has left-sided deficits chronically from previous stroke.  Her history has not been consistent.  She told the ER doctor that she woke up with these symptoms so her last known well would have been 4 AM.  She told me that when she got up she was normal and then the symptoms started around lunchtime.  She reports no weakness to me, no difficulty swallowing.  She does report slurred speech.  Patient reports that she is wheelchair-bound, so she would not know if her gait had changed.  She reports she did have a headache on the front and top of her head.  It felt like a pressure.  She took Tylenol but it did not help.  Headache resolved later.  She reports she had some slight chest pain that was substernal.  It happened while she was watching TV.  It felt like pressure.  She did not have any associated dyspnea, diaphoresis, nausea.  She did have associated palpitations.  The palpitations were also associated with when she had her intermittent slurred speech.  The chest pain resolved on its own.  Patient reports she did have some nausea as well but no vomiting.  Patient has a history of atrial fibrillation, reports she is not sure if she has been taking her Eliquis at home.  According to our records her prescription has expired.  Patient reports she is takes a  handful of medications and does not pay attention to what they are.  Patient has no other complaints at this time.  Patient does not smoke, does not drink, does not use illicit drugs.  She is vaccinated for COVID.  Patient is full code.  Review of Systems: As mentioned in the history of present illness. All other systems reviewed and are negative. Past Medical History:  Diagnosis Date   Anxiety    Atherosclerotic heart disease of native coronary artery without angina pectoris    Bipolar 1 disorder, depressed (HCC)    Chronic pain syndrome    COPD (chronic obstructive pulmonary disease) ([place])    COVID-19    Depression    Essential (primary) hypertension    Gastroesophageal reflux    GERD without esophagitis    Heart failure (HCC)    Heart failure, unspecified (HCC)    Hemiparesis (HCC)    Hemiplegia (HCC)    Herpesviral infection, unspecified    Hyperlipidemia    Hypothyroidism    Iron deficiency anemia    Long term (current) use of anticoagulants    Major depressive disorder    Opioid dependence, uncomplicated (HCC)    Other disorders of plasma-protein metabolism, not elsewhere classified    Paraplegia, unspecified ([place])    Personal history of other diseases of the circulatory system    Phonological disorder    Presence of aortocoronary bypass graft    Seizures ([place])    Stroke ([place])    Unspecified convulsions ([place])  Vitamin D deficiency    Past Surgical History:  Procedure Laterality Date   CARDIAC SURGERY     cabg   COLONOSCOPY WITH PROPOFOL N/A 05/14/2018   Procedure: COLONOSCOPY WITH PROPOFOL;  Surgeon: Rogene Houston, MD;  Location: [facility];  Service: Endoscopy;  Laterality: N/A;   ESOPHAGOGASTRODUODENOSCOPY (EGD) WITH PROPOFOL N/A 05/14/2018   Procedure: ESOPHAGOGASTRODUODENOSCOPY (EGD) WITH PROPOFOL;  Surgeon: Rogene Houston, MD;  Location: [facility];  Service: Endoscopy;  Laterality: N/A;   POLYPECTOMY  05/14/2018   Procedure: POLYPECTOMY;  Surgeon:  Rogene Houston, MD;  Location: [facility];  Service: Endoscopy;;  cecal polyp   THYROID SURGERY     71 yrs old   Social History:  reports that she quit smoking about 19 years ago. Her smoking use included cigarettes. She smoked an average of .5 packs per day. She has never used smokeless tobacco. She reports that she does not drink alcohol and does not use drugs.  Allergies  Allergen Reactions   Formaldehyde     Unknown   Latex Other (See Comments)    unknown    Family History  Problem Relation Age of Onset   Hypertension Other    Ovarian cancer Daughter    Ovarian cancer Mother    Breast cancer Mother    Liver disease Neg Hx    Colon cancer Neg Hx     Prior to Admission medications   Medication Sig Start Date End Date Taking? Authorizing Provider  acetaminophen (TYLENOL) 325 MG tablet Take 650 mg by mouth every 8 (eight) hours as needed.    [provider]  apixaban (ELIQUIS) 2.5 MG TABS tablet Take 1 tablet (2.5 mg total) by mouth 2 (two) times daily. 03/10/21 04/09/21  Johnson, Clanford L, MD  ascorbic acid (VITAMIN C) 500 MG tablet Take 500 mg by mouth 2 (two) times daily.    [provider]  DULoxetine (CYMBALTA) 30 MG capsule Take 30 mg by mouth daily.     [provider]  escitalopram (LEXAPRO) 20 MG tablet Take 20 mg by mouth daily.    [provider]  fluticasone (FLONASE) 50 MCG/ACT nasal spray Place 1 spray into both nostrils at bedtime.    [provider]  furosemide (LASIX) 20 MG tablet Take 20 mg by mouth daily.     [provider]  hydrOXYzine (ATARAX/VISTARIL) 25 MG tablet Take 25 mg by mouth every 6 (six) hours as needed for itching.     [provider]  ipratropium (ATROVENT) 0.03 % nasal spray Place 2 sprays into both nostrils every 6 (six) hours as needed for rhinitis.    [provider]  iron polysaccharides (NIFEREX) 150 MG capsule Take 1 capsule (150 mg total) by mouth 2 (two) times  daily. 10/20/19   Barton Dubois, MD  isosorbide dinitrate (ISORDIL) 30 MG tablet Take 30 mg by mouth daily.    [provider]  levETIRAcetam (KEPPRA) 500 MG tablet Take 1 tablet (500 mg total) by mouth 2 (two) times daily for 30 days. 02/09/19 03/11/19  Manuella Ghazi, Pratik D, DO  levothyroxine (SYNTHROID) 137 MCG tablet Take 137 mcg by mouth daily. 02/07/21   [provider]  Lidocaine 4 % PTCH Apply 1 patch topically See admin instructions. 12 hours on and 12 hours off    [provider]  LORazepam (ATIVAN) 0.5 MG tablet Take 1 tablet (0.5 mg total) by mouth at bedtime. 03/09/21   Murlean Iba, MD  Melatonin 3 MG TABS Take 3 mg by mouth at bedtime.     [provider]  metoprolol tartrate (LOPRESSOR) 50 MG tablet Take 1 tablet (50 mg total) by mouth 2 (two) times daily. 03/09/21   Johnson, Clanford L, MD  nitroGLYCERIN (NITROSTAT) 0.4 MG SL tablet Place 0.4 mg under the tongue every 5 (five) minutes as needed for chest pain.    [provider]  NYSTATIN powder Apply 1 application topically 2 (two) times daily. 02/07/21   [provider]  ondansetron (ZOFRAN) 4 MG tablet Take 4 mg by mouth every 8 (eight) hours as needed for nausea or vomiting.     [provider]  oxyCODONE-acetaminophen (PERCOCET/ROXICET) 5-325 MG tablet Take 1 tablet by mouth every 12 (twelve) hours as needed for moderate pain or severe pain. 03/09/21   Johnson, Clanford L, MD  pantoprazole (PROTONIX) 40 MG tablet Take 1 tablet (40 mg total) by mouth daily. 03/09/21   Johnson, Clanford L, MD  polyethylene glycol powder (GLYCOLAX/MIRALAX) powder Take 17 g by mouth daily.    [provider]  pregabalin (LYRICA) 150 MG capsule Take 150 mg by mouth 2 (two) times daily. 02/14/21   [provider]  rosuvastatin (CRESTOR) 5 MG tablet Take 5 mg by mouth daily. 02/26/21   [provider]  senna (SENOKOT) 8.6 MG TABS tablet Take 1 tablet by mouth 2 (two) times  daily.    [provider]  sucralfate (CARAFATE) 1 g tablet Take 1 tablet (1 g total) by mouth 4 (four) times daily -  with meals and at bedtime. 07/10/21   Veryl Speak, MD  tiZANidine (ZANAFLEX) 2 MG tablet Take 2 mg by mouth 2 (two) times daily. 02/12/21   [provider]  vitamin B-12 (CYANOCOBALAMIN) 1000 MCG tablet Take 1,000 mcg by mouth daily.    [provider]    Physical Exam: Vitals:   07/30/22 0330 07/30/22 0345 07/30/22 0400 07/30/22 0415  BP: 130/68 123/61 136/62 134/63  Pulse: 70 72 74 73  Resp: '14 18 17 17  '$ Temp:      SpO2: 98% 93% 96% 94%  Weight:      Height:       1.  General: Patient lying supine in bed,  no acute distress   2. Psychiatric: Somnolent and oriented x 3, mood and behavior normal for situation, pleasant and cooperative with exam   3. Neurologic: Speech is slurred, language is normal, left-sided facial droop, chronic left-sided hemiparesis/plegia,    4. HEENMT:  Head is atraumatic, normocephalic, pupils reactive to light, neck is supple, trachea is midline, mucous membranes are moist   5. Respiratory : Lungs are clear to auscultation bilaterally without wheezing, rhonchi, rales, no cyanosis, no increase in work of breathing or accessory muscle use   6. Cardiovascular : Heart rate normal, rhythm is regular, no murmurs, rubs or gallops, no peripheral edema, peripheral pulses palpated   7. Gastrointestinal:  Abdomen is soft, nondistended, nontender to palpation bowel sounds active, no masses or organomegaly palpated   8. Skin:  Skin is warm, dry and intact without rashes, acute lesions, or ulcers on limited exam   9.Musculoskeletal:  Previous amputations, but no acute deformities or trauma, no asymmetry in tone, peripheral pulses palpated, no tenderness to palpation in the extremities  Data Reviewed: In the ED Temp 98.8, heart rate 77-84, respiratory 16-25, blood pressure 128/64-152/117 satting at 93% No  leukocytosis with a white blood cell count of 8.1, hemoglobin 13.5, platelets  185 Hypokalemic at 3.4, hyperglycemic at 180 Alcohol levels undetectable CTA shows no acute large vessel occlusion but previous MCA infarct on the right CT head shows no acute intracranial abnormality Neurology was consulted and recommended admission for stroke work-up  Assessment and Plan: * Aphasia - Patient complains of intermittent slurred speech - CT head showed no acute intracranial abnormality - CTA showed no acute large vessel occlusion.  Chronic right MCA infarct.  At 2.6 cm thyroid nodule - Patient does have a history of chronic hemiplegia on the left - Telemetry neuro was consulted and recommends monitoring on telemetry, neurochecks, bedside swallow eval, DVT prophylaxis, IV fluids, head of bed at 30 degrees, euglycemia, MRI brain - MRI brain ordered as well as echo, neurochecks, swallow eval, DVT prophylaxis, and other recommendations - PT/OT/ST eval and treat - Continue statin - Holding antihypertensives for permissive hypertension - Continue to monitor  Atrial fibrillation, chronic (HCC) - Holding metoprolol for permissive hypertension - Continue Eliquis  Hypokalemia Replace and recheck in the a.m.  Esophageal reflux disease Continue Protonix  CAD (coronary artery disease) - Continue Imdur, Crestor - Chest pain-free - Monitor on telemetry  Anxiety - Continue benzo  Depression - Continue Lexapro  COPD (chronic obstructive pulmonary disease) (HCC) - Not clinically exacerbated at this point - Continue to monitor  Hyperlipidemia Continue Crestor  Hypothyroid Continue Synthroid - Check TSH      Advance Care Planning:   Code Status: Full Code   Consults: Neurology  Family Communication: No family at bedside  Severity of Illness: The appropriate patient status for this patient is OBSERVATION. Observation status is judged to be reasonable and necessary in order to provide  the required intensity of service to ensure the patient's safety. The patient's presenting symptoms, physical exam findings, and initial radiographic and laboratory data in the context of their medical condition is felt to place them at decreased risk for further clinical deterioration. Furthermore, it is anticipated that the patient will be medically stable for discharge from the hospital within 2 midnights of admission.   Author: Rolla Plate, DO 07/30/2022 5:07 AM  For on call review [email or web address].

## 2022-07-30 NOTE — Assessment & Plan Note (Addendum)
-   Continue benzo  

## 2022-07-30 NOTE — Assessment & Plan Note (Signed)
Continue Lexapro

## 2022-07-30 NOTE — Assessment & Plan Note (Signed)
Continue Synthroid.  Check TSH. 

## 2022-07-30 NOTE — Assessment & Plan Note (Signed)
-   Patient complains of intermittent slurred speech - CT head showed no acute intracranial abnormality - CTA showed no acute large vessel occlusion.  Chronic right MCA infarct.  At 2.6 cm thyroid nodule - Patient does have a history of chronic hemiplegia on the left - Telemetry neuro was consulted and recommends monitoring on telemetry, neurochecks, bedside swallow eval, DVT prophylaxis, IV fluids, head of bed at 30 degrees, euglycemia, MRI brain - MRI brain ordered as well as echo, neurochecks, swallow eval, DVT prophylaxis, and other recommendations - PT/OT/ST eval and treat - Continue statin - Holding antihypertensives for permissive hypertension - Continue to monitor

## 2022-07-30 NOTE — Progress Notes (Addendum)
2326: Code stroke cart activated.  2330: pt to CT  2342: TSP paged 2350: Pt returned from CT 2355: TSP on camera to assess patient. TSP to follow up with EDP regarding plan of care.  0002: TSP and TSRN off camera. Notified bedside RN if any concerns or needs to call back.

## 2022-07-30 NOTE — Progress Notes (Signed)
OT Cancellation Note  Patient Details Name: Karen Mccann MRN: [id number] DOB: September 23, 1951   Cancelled Treatment:    Reason Eval/Treat Not Completed: OT screened, no needs identified, will sign off. Pt reports being bed bound at baseline with L side flaccid at baseline as well. Pt able to demonstrate [facility] A/ROM of R UE and no new visual deficits. Assisted by SNF staff at baseline. Pt will be removed from OT list.   Larey Seat OT, MOT  Larey Seat 07/30/2022, 11:27 AM

## 2022-07-30 NOTE — Assessment & Plan Note (Signed)
-   Continue Imdur, Crestor - Chest pain-free - Monitor on telemetry

## 2022-07-30 NOTE — Consult Note (Signed)
[facility] TeleNeurology Consult Services   Patient Name:   Karen Mccann, Karen Mccann Date of Birth:   June 24, 1951 Identification Number:   MRN - [id number] Date of Service:   07/29/2022 23:42:24  Diagnosis:       R29.810 - Facial numbness/ Facial weakness  Impression:      Karen Mccann is a 71 yo F w/ a hx of a R MCA stroke who presents with right facial weakness and slurred speech. Here, fluency is intact. No right sided weakness although does have mild dysarthria. Has chronic left hemiparesis. Head CT showed no acute intracranial pathology. No LVO on CTA. Presentation concerning for possible TIA involving the left hemisphere. Recommend the following:  - Continue Eliquis  - Permissive HTN (SBP <220) until 1900 on 10/18  - Bedside swallow eval  - MRI brain w/o contrast  -Telemetry  -TTE  -hemoglobin A1c, lipid panel  -PT/OT    Our recommendations are outlined below.  Recommendations:        Stroke/Telemetry Floor       Neuro Checks       Bedside Swallow Eval       DVT Prophylaxis       IV Fluids, Normal Saline       Head of Bed 30 Degrees       Euglycemia and Avoid Hyperthermia (PRN Acetaminophen)     ------------------------------------------------------------------------------  Advanced Imaging: CTA Head and Neck Completed.  LVO:No  Patient in not a candidate for NIR   Metrics: Last Known Well: 07/29/2022 19:00:00 [facility] Notification Time: 07/29/2022 23:42:24 Arrival Time: 07/29/2022 23:48:00 Stamp Time: 07/29/2022 23:42:24 Initial Response Time: 07/29/2022 23:50:36 Symptoms: right sided facial weakness and slurred speech. Initial patient interaction: 07/29/2022 23:58:00 NIHSS Assessment Completed: 07/29/2022 23:59:00 Patient is not a candidate for Thrombolytic. Thrombolytic Medical Decision: 07/30/2022 00:30:00 Patient was not deemed candidate for Thrombolytic because of following reasons: Last Well Known Above 4.5 Hours.  CT head showed no acute  hemorrhage or acute core infarct.  Primary Provider Notified of Diagnostic Impression and Management Plan on: 07/30/2022 00:28:00    ------------------------------------------------------------------------------  History of Present Illness: Patient is a 71 year old Female.  Patient was brought by EMS for symptoms of right sided facial weakness and slurred speech. Karen Mccann is a 71 yo F w/ a hx of a R MCA stroke who presents with right facial weakness and slurred speech. LKN is unclear. Patient states she began having difficulty with her speech around lunchtime. Facility states her symptoms started at 1900. Patient reports her new symptoms have resolved in the past hour. Here, fluency is intact. No right sided weakness although does have mild dysarthria. Has chronic left hemiparesis. Head CT showed no acute intracranial pathology. No LVO on CTA.    Past Medical History:      Stroke  Medications:  Anticoagulant use:  Yes Eliquis No Antiplatelet use Reviewed EMR for current medications  Allergies:  Reviewed  Social History: Alcohol Use: No Drug Use: No  Family History:  There is no family history of premature cerebrovascular disease pertinent to this consultation  ROS : 14 Points Review of Systems was performed and was negative except mentioned in HPI.  Past Surgical History: There Is No Surgical History Contributory To Today's Visit    Examination: BP(152/65), Pulse(83), Blood Glucose(184) 1A: Level of Consciousness - Alert; keenly responsive + 0 1B: Ask Month and Age - 1 Question Right + 1 1C: Blink Eyes & Squeeze Hands - Performs Both Tasks + 0 2: Test Horizontal Extraocular  Movements - Normal + 0 3: Test Visual Fields - No Visual Loss + 0 4: Test Facial Palsy (Use Grimace if Obtunded) - Minor paralysis (flat nasolabial fold, smile asymmetry) + 1 5A: Test Left Arm Motor Drift - No Effort Against Gravity + 3 5B: Test Right Arm Motor Drift - No Effort Against  Gravity + 3 6A: Test Left Leg Motor Drift - No Drift for 5 Seconds + 0 6B: Test Right Leg Motor Drift - Drift, but doesn't hit bed + 1 7: Test Limb Ataxia (FNF/Heel-Shin) - No Ataxia + 0 8: Test Sensation - Mild-Moderate Loss: Less Sharp/More Dull + 1 9: Test Language/Aphasia - Normal; No aphasia + 0 10: Test Dysarthria - Mild-Moderate Dysarthria: Slurring but can be understood + 1 11: Test Extinction/Inattention - No abnormality + 0  NIHSS Score: 11   Pre-Morbid Modified Rankin Scale: 4 Points = Moderately severe disability; unable to walk and attend to bodily needs without assistance  Spoke with : ED Provider  Patient/Family was informed the Neurology Consult would occur via TeleHealth consult by way of interactive audio and video telecommunications and consented to receiving care in this manner.   Patient is being evaluated for possible acute neurologic impairment and high probability of imminent or life-threatening deterioration. I spent total of 24 minutes providing care to this patient, including time for face to face visit via telemedicine, review of medical records, imaging studies and discussion of findings with providers, the patient and/or family.   Dr Abbe Amsterdam   [facility] For Inpatient follow-up with [facility] physician please call [facility] [phone]. This is not an outpatient service. Post hospital discharge, please contact hospital directly.

## 2022-07-30 NOTE — Assessment & Plan Note (Signed)
-   Not clinically exacerbated at this point - Continue to monitor

## 2022-07-30 NOTE — Assessment & Plan Note (Signed)
-   Holding metoprolol for permissive hypertension - Continue Eliquis

## 2022-07-30 NOTE — Assessment & Plan Note (Signed)
Replace and recheck in the a.m.

## 2023-05-05 ENCOUNTER — Encounter (INDEPENDENT_AMBULATORY_CARE_PROVIDER_SITE_OTHER): Payer: Self-pay | Admitting: *Deleted
